# Patient Record
Sex: Female | Born: 1960 | Race: Black or African American | Hispanic: No | Smoking: Current every day smoker
Health system: Southern US, Community
[De-identification: ages and names within clinical notes are randomized; demographics above are authoritative.]

## PROBLEM LIST (undated history)

## (undated) DIAGNOSIS — E7800 Pure hypercholesterolemia, unspecified: Secondary | ICD-10-CM

## (undated) DIAGNOSIS — E119 Type 2 diabetes mellitus without complications: Secondary | ICD-10-CM

## (undated) DIAGNOSIS — E039 Hypothyroidism, unspecified: Secondary | ICD-10-CM

## (undated) DIAGNOSIS — I10 Essential (primary) hypertension: Secondary | ICD-10-CM

## (undated) DIAGNOSIS — Z87442 Personal history of urinary calculi: Secondary | ICD-10-CM

## (undated) DIAGNOSIS — IMO0001 Reserved for inherently not codable concepts without codable children: Secondary | ICD-10-CM

## (undated) DIAGNOSIS — R7989 Other specified abnormal findings of blood chemistry: Secondary | ICD-10-CM

## (undated) DIAGNOSIS — F419 Anxiety disorder, unspecified: Secondary | ICD-10-CM

## (undated) DIAGNOSIS — M545 Low back pain, unspecified: Secondary | ICD-10-CM

## (undated) DIAGNOSIS — L2089 Other atopic dermatitis: Secondary | ICD-10-CM

## (undated) DIAGNOSIS — F329 Major depressive disorder, single episode, unspecified: Secondary | ICD-10-CM

## (undated) DIAGNOSIS — F209 Schizophrenia, unspecified: Secondary | ICD-10-CM

## (undated) DIAGNOSIS — F32A Depression, unspecified: Secondary | ICD-10-CM

## (undated) DIAGNOSIS — F319 Bipolar disorder, unspecified: Secondary | ICD-10-CM

## (undated) DIAGNOSIS — D360 Benign neoplasm of lymph nodes: Secondary | ICD-10-CM

## (undated) DIAGNOSIS — R599 Enlarged lymph nodes, unspecified: Secondary | ICD-10-CM

## (undated) DIAGNOSIS — E079 Disorder of thyroid, unspecified: Secondary | ICD-10-CM

## (undated) HISTORY — DX: Reserved for inherently not codable concepts without codable children: IMO0001

## (undated) HISTORY — DX: Other atopic dermatitis: L20.89

## (undated) HISTORY — DX: Low back pain, unspecified: M54.50

## (undated) HISTORY — DX: Other specified abnormal findings of blood chemistry: R79.89

## (undated) HISTORY — DX: Schizophrenia, unspecified: F20.9

## (undated) HISTORY — DX: Low back pain: M54.5

## (undated) HISTORY — PX: APPENDECTOMY: SHX54

## (undated) HISTORY — DX: Essential (primary) hypertension: I10

## (undated) HISTORY — DX: Bipolar disorder, unspecified: F31.9

## (undated) HISTORY — DX: Type 2 diabetes mellitus without complications: E11.9

## (undated) HISTORY — PX: THYROIDECTOMY: SHX17

## (undated) HISTORY — DX: Depression, unspecified: F32.A

## (undated) HISTORY — DX: Benign neoplasm of lymph nodes: D36.0

## (undated) HISTORY — PX: ABDOMINAL HYSTERECTOMY: SHX81

## (undated) HISTORY — DX: Major depressive disorder, single episode, unspecified: F32.9

## (undated) HISTORY — DX: Enlarged lymph nodes, unspecified: R59.9

## (undated) HISTORY — DX: Hypothyroidism, unspecified: E03.9

## (undated) HISTORY — PX: EYE SURGERY: SHX253

---

## 1999-07-28 ENCOUNTER — Emergency Department (HOSPITAL_COMMUNITY): Admission: EM | Admit: 1999-07-28 | Discharge: 1999-07-28 | Payer: Self-pay | Admitting: Emergency Medicine

## 2001-01-27 ENCOUNTER — Emergency Department (HOSPITAL_COMMUNITY): Admission: EM | Admit: 2001-01-27 | Discharge: 2001-01-27 | Payer: Self-pay | Admitting: Emergency Medicine

## 2001-02-12 ENCOUNTER — Emergency Department (HOSPITAL_COMMUNITY): Admission: EM | Admit: 2001-02-12 | Discharge: 2001-02-12 | Payer: Self-pay | Admitting: Emergency Medicine

## 2001-07-16 ENCOUNTER — Emergency Department (HOSPITAL_COMMUNITY): Admission: EM | Admit: 2001-07-16 | Discharge: 2001-07-16 | Payer: Self-pay | Admitting: Emergency Medicine

## 2004-05-10 ENCOUNTER — Emergency Department (HOSPITAL_COMMUNITY): Admission: EM | Admit: 2004-05-10 | Discharge: 2004-05-11 | Payer: Self-pay | Admitting: Emergency Medicine

## 2004-12-25 ENCOUNTER — Emergency Department (HOSPITAL_COMMUNITY): Admission: EM | Admit: 2004-12-25 | Discharge: 2004-12-25 | Payer: Self-pay | Admitting: Emergency Medicine

## 2005-08-09 ENCOUNTER — Emergency Department (HOSPITAL_COMMUNITY): Admission: EM | Admit: 2005-08-09 | Discharge: 2005-08-09 | Payer: Self-pay | Admitting: Emergency Medicine

## 2006-02-05 ENCOUNTER — Emergency Department (HOSPITAL_COMMUNITY): Admission: EM | Admit: 2006-02-05 | Discharge: 2006-02-05 | Payer: Self-pay | Admitting: Emergency Medicine

## 2009-01-22 LAB — HM MAMMOGRAPHY

## 2010-09-03 ENCOUNTER — Emergency Department (HOSPITAL_COMMUNITY)
Admission: EM | Admit: 2010-09-03 | Discharge: 2010-09-03 | Disposition: A | Discharge status: Home / Self Care | Payer: BC Managed Care – PPO | Attending: Emergency Medicine | Admitting: Emergency Medicine

## 2010-09-03 ENCOUNTER — Emergency Department (HOSPITAL_COMMUNITY): Payer: BC Managed Care – PPO

## 2010-09-03 DIAGNOSIS — K8020 Calculus of gallbladder without cholecystitis without obstruction: Secondary | ICD-10-CM | POA: Insufficient documentation

## 2010-09-03 DIAGNOSIS — R1013 Epigastric pain: Secondary | ICD-10-CM | POA: Insufficient documentation

## 2010-09-03 LAB — URINALYSIS, ROUTINE W REFLEX MICROSCOPIC
Ketones, ur: NEGATIVE mg/dL
Leukocytes, UA: NEGATIVE
Nitrite: NEGATIVE
pH: 6.5 (ref 5.0–8.0)

## 2010-09-03 LAB — DIFFERENTIAL
Basophils Absolute: 0 10*3/uL (ref 0.0–0.1)
Eosinophils Absolute: 0.1 10*3/uL (ref 0.0–0.7)
Eosinophils Relative: 1 % (ref 0–5)
Lymphocytes Relative: 14 % (ref 12–46)
Neutrophils Relative %: 78 % — ABNORMAL HIGH (ref 43–77)

## 2010-09-03 LAB — COMPREHENSIVE METABOLIC PANEL
ALT: 11 U/L (ref 0–35)
AST: 14 U/L (ref 0–37)
Albumin: 3.5 g/dL (ref 3.5–5.2)
Alkaline Phosphatase: 106 U/L (ref 39–117)
Chloride: 103 mEq/L (ref 96–112)
Potassium: 3.8 mEq/L (ref 3.5–5.1)
Total Bilirubin: 0.4 mg/dL (ref 0.3–1.2)

## 2010-09-03 LAB — CBC
HCT: 36.7 % (ref 36.0–46.0)
Platelets: 358 10*3/uL (ref 150–400)
RBC: 4.46 MIL/uL (ref 3.87–5.11)
RDW: 15.8 % — ABNORMAL HIGH (ref 11.5–15.5)
WBC: 11.7 10*3/uL — ABNORMAL HIGH (ref 4.0–10.5)

## 2010-10-29 ENCOUNTER — Emergency Department (HOSPITAL_COMMUNITY)
Admission: EM | Admit: 2010-10-29 | Discharge: 2010-10-29 | Disposition: A | Discharge status: Home / Self Care | Payer: BC Managed Care – PPO | Attending: Emergency Medicine | Admitting: Emergency Medicine

## 2010-10-29 DIAGNOSIS — H109 Unspecified conjunctivitis: Secondary | ICD-10-CM | POA: Insufficient documentation

## 2010-10-29 DIAGNOSIS — E039 Hypothyroidism, unspecified: Secondary | ICD-10-CM | POA: Insufficient documentation

## 2010-10-29 DIAGNOSIS — H53149 Visual discomfort, unspecified: Secondary | ICD-10-CM | POA: Insufficient documentation

## 2010-10-29 DIAGNOSIS — H5710 Ocular pain, unspecified eye: Secondary | ICD-10-CM | POA: Insufficient documentation

## 2011-01-22 LAB — HM PAP SMEAR

## 2011-02-21 ENCOUNTER — Other Ambulatory Visit: Payer: Self-pay

## 2011-02-21 ENCOUNTER — Emergency Department (HOSPITAL_COMMUNITY)
Admission: EM | Admit: 2011-02-21 | Discharge: 2011-02-21 | Disposition: A | Discharge status: Home / Self Care | Payer: BC Managed Care – PPO | Attending: Emergency Medicine | Admitting: Emergency Medicine

## 2011-02-21 ENCOUNTER — Emergency Department (HOSPITAL_COMMUNITY): Payer: BC Managed Care – PPO

## 2011-02-21 ENCOUNTER — Encounter (HOSPITAL_COMMUNITY): Payer: Self-pay | Admitting: *Deleted

## 2011-02-21 DIAGNOSIS — R079 Chest pain, unspecified: Secondary | ICD-10-CM | POA: Insufficient documentation

## 2011-02-21 DIAGNOSIS — R0602 Shortness of breath: Secondary | ICD-10-CM | POA: Insufficient documentation

## 2011-02-21 HISTORY — DX: Pure hypercholesterolemia, unspecified: E78.00

## 2011-02-21 HISTORY — DX: Anxiety disorder, unspecified: F41.9

## 2011-02-21 HISTORY — DX: Disorder of thyroid, unspecified: E07.9

## 2011-02-21 LAB — BASIC METABOLIC PANEL
BUN: 8 mg/dL (ref 6–23)
CO2: 25 mEq/L (ref 19–32)
Calcium: 9.4 mg/dL (ref 8.4–10.5)
Chloride: 105 mEq/L (ref 96–112)
Creatinine, Ser: 0.7 mg/dL (ref 0.50–1.10)
GFR calc Af Amer: >90 mL/min (ref >90)
GFR calc non Af Amer: >90 mL/min (ref >90)
Glucose, Bld: 119 mg/dL — ABNORMAL HIGH (ref 70–99)
Potassium: 3.6 mEq/L (ref 3.5–5.1)
Sodium: 137 mEq/L (ref 135–145)

## 2011-02-21 LAB — CBC
HCT: 34.4 % — ABNORMAL LOW (ref 36.0–46.0)
Hemoglobin: 11.5 g/dL — ABNORMAL LOW (ref 12.0–15.0)
MCH: 26.8 pg (ref 26.0–34.0)
MCHC: 33.4 g/dL (ref 30.0–36.0)
MCV: 80.2 fL (ref 78.0–100.0)
Platelets: 337 10*3/uL (ref 150–400)
RBC: 4.29 MIL/uL (ref 3.87–5.11)
RDW: 16.7 % — ABNORMAL HIGH (ref 11.5–15.5)
WBC: 4.7 10*3/uL (ref 4.0–10.5)

## 2011-02-21 LAB — TROPONIN I: Troponin I: <0.3 ng/mL (ref <0.30)

## 2011-02-21 MED ORDER — OXYCODONE-ACETAMINOPHEN 5-325 MG PO TABS: 1.0000 | ORAL_TABLET | ORAL | Status: AC | PRN

## 2011-02-21 MED ORDER — NAPROXEN 500 MG PO TABS: 500.0000 mg | ORAL_TABLET | Freq: Two times a day (BID) | ORAL | Status: DC

## 2011-02-21 MED ORDER — ASPIRIN 81 MG PO CHEW
324.0000 mg | CHEWABLE_TABLET | Freq: Once | ORAL | Status: AC
  Administered 2011-02-21: 324 mg via ORAL
  Filled 2011-02-21: qty 4

## 2011-02-21 NOTE — ED Provider Notes (Signed)
History    51yf with CP. Gradual onset day before yesterday. Crampy. Substernal. Occasional sharper pain with movement and radiation into LUE. Denies trauma. mild shortness of breath. No coughing. No rash. No unusual leg pain or swelling. Denies history of blood clot. No fever sweats or chills. Denies history of similar pain.  CSN: [id number]  Arrival date & time 02/21/11  1227   First MD Initiated Contact with Patient 02/21/11 1245      Chief Complaint  Patient presents with  . Chest Pain    (Consider location/radiation/quality/duration/timing/severity/associated sxs/prior treatment) HPI  Past Medical History  Diagnosis Date  . Thyroid disease   . Hypercholesteremia   . Anxiety     Past Surgical History  Procedure Date  . Abdominal hysterectomy   . Thyroidectomy   . Appendectomy     No family history on file.  History  Substance Use Topics  . Smoking status: Current Everyday Smoker  . Smokeless tobacco: Not on file  . Alcohol Use: No    OB History    Grav Para Term Preterm Abortions TAB SAB Ect Mult Living                  Review of Systems   Review of symptoms negative unless otherwise noted in HPI.   Allergies  Review of patient's allergies indicates no known allergies.  Home Medications  No current outpatient prescriptions on file.  BP 134/93  Pulse 93  Temp(Src) 98.3 F (36.8 C) (Oral)  Resp 24  Physical Exam  Nursing note and vitals reviewed. Constitutional: She appears well-developed and well-nourished. No distress.  HENT:  Head: Normocephalic and atraumatic.  Eyes: Conjunctivae are normal. Right eye exhibits no discharge. Left eye exhibits no discharge.  Neck: Neck supple.  Cardiovascular: Normal rate, regular rhythm and normal heart sounds.  Exam reveals no gallop and no friction rub.   No murmur heard. Pulmonary/Chest: Effort normal and breath sounds normal. No respiratory distress. She exhibits tenderness.       Tenderness to  palpation left parasternal border. Overlying skin is grossly normal to inspection. Pain is reproducible with range of motion left shoulder  Abdominal: Soft. She exhibits no distension. There is no tenderness.  Musculoskeletal: She exhibits no edema and no tenderness.  Neurological: She is alert.  Skin: Skin is warm and dry.  Psychiatric: She has a normal mood and affect. Her behavior is normal. Thought content normal.    ED Course  Procedures (including critical care time)  Labs Reviewed  CBC - Abnormal; Notable for the following:    Hemoglobin 11.5 (*)    HCT 34.4 (*)    RDW 16.7 (*)    All other components within normal limits  BASIC METABOLIC PANEL - Abnormal; Notable for the following:    Glucose, Bld 119 (*)    All other components within normal limits  TROPONIN I   Dg Chest 1 View  02/21/2011  *RADIOLOGY REPORT*  Clinical Data: Chest pain.  CHEST - 1 VIEW  Comparison: None.  Findings: Portable semi upright view of the chest was obtained. The lungs are clear bilaterally.  There is a round density along the medial aspect of the left hemidiaphragm.  This could represent focal eventration of the left hemidiaphragm. Heart size is within normal limits and the trachea is midline.  IMPRESSION: No acute chest findings.  Round density along the medial aspect of the left hemidiaphragm may be related to diaphragmatic eventration or hernia but indeterminate.  A lateral  view may be useful to further identify the location of this structure.  Original Report Authenticated By: Richarda Overlie, M.D.    EKG:  Rhythm: Normal sinus rhythm Rate: 90 Axis: Normal Intervals: Normal ST segments: Normal  2:40 PM Patient's O2 sat checked and was 97% on room air.  1. Chest pain       MDM  51 year old female with chest pain. Consider ACS, pulmonary embolism, dissection, infectious, GERD, anxiety, musculoskeletal. Suspect musculoskeletal. Pain is very reproducible with palpation and movement of the left  shoulder. Patient does have multiple risk factors for coronary artery disease including age, truncal obesity, hypertension, hyperlipidemia, smoking and family history but this is very atypical pain for ACS given reproducibility and constant duration for several days. Very low clinical suspicion. EKG with no ischemic changes. Troponin is normal. Doubt infectious. Patient is not hypoxic. No dyspnea. No cough and afebrile. Chest x-ray does not suggest pneumonia. Mediastinum is within normal limits and no radiation to the back to suggest dissection. Doubt GERD. Low clinical suspicion for pulmonary embolism. Plan symptomatic treatment for very likely musculoskeletal pain.        Raeford Razor, MD 03/05/11 [phone]

## 2011-02-21 NOTE — ED Notes (Signed)
[id number]<GN> Expected date:<BR> Expected time:<BR> Means of arrival:<BR> Comments:<BR> Toni Moses

## 2011-02-21 NOTE — ED Notes (Signed)
Dr. Juleen China notified re pt's dizziness spell

## 2011-02-21 NOTE — ED Notes (Signed)
Pt reports chest pain that began yesterday. Describes as a "cramping sensation" with occasional sharp pains. Reports sob and nausea associated. Stated pain radiated down L arm as well. No cardiac Hx. Sts pain worsens with movement, no change with deep breath.

## 2011-06-09 ENCOUNTER — Emergency Department (HOSPITAL_COMMUNITY)
Admission: EM | Admit: 2011-06-09 | Discharge: 2011-06-09 | Discharge status: Against Medical Advice | Payer: BC Managed Care – PPO | Attending: Emergency Medicine | Admitting: Emergency Medicine

## 2011-06-09 DIAGNOSIS — Z043 Encounter for examination and observation following other accident: Secondary | ICD-10-CM | POA: Insufficient documentation

## 2011-07-07 ENCOUNTER — Emergency Department (HOSPITAL_COMMUNITY): Payer: BC Managed Care – PPO

## 2011-07-07 ENCOUNTER — Encounter (HOSPITAL_COMMUNITY): Payer: Self-pay | Admitting: Emergency Medicine

## 2011-07-07 ENCOUNTER — Emergency Department (HOSPITAL_COMMUNITY)
Admission: EM | Admit: 2011-07-07 | Discharge: 2011-07-07 | Disposition: A | Discharge status: Home / Self Care | Payer: BC Managed Care – PPO | Attending: Emergency Medicine | Admitting: Emergency Medicine

## 2011-07-07 DIAGNOSIS — E7800 Pure hypercholesterolemia, unspecified: Secondary | ICD-10-CM | POA: Insufficient documentation

## 2011-07-07 DIAGNOSIS — E079 Disorder of thyroid, unspecified: Secondary | ICD-10-CM | POA: Insufficient documentation

## 2011-07-07 DIAGNOSIS — F17200 Nicotine dependence, unspecified, uncomplicated: Secondary | ICD-10-CM | POA: Insufficient documentation

## 2011-07-07 DIAGNOSIS — R079 Chest pain, unspecified: Secondary | ICD-10-CM | POA: Insufficient documentation

## 2011-07-07 DIAGNOSIS — E785 Hyperlipidemia, unspecified: Secondary | ICD-10-CM | POA: Insufficient documentation

## 2011-07-07 DIAGNOSIS — F411 Generalized anxiety disorder: Secondary | ICD-10-CM | POA: Insufficient documentation

## 2011-07-07 LAB — BASIC METABOLIC PANEL
BUN: 13 mg/dL (ref 6–23)
Calcium: 9.3 mg/dL (ref 8.4–10.5)
Creatinine, Ser: 0.7 mg/dL (ref 0.50–1.10)
GFR calc non Af Amer: >90 mL/min (ref >90)
Glucose, Bld: 98 mg/dL (ref 70–99)
Potassium: 4.6 mEq/L (ref 3.5–5.1)

## 2011-07-07 LAB — CBC
Hemoglobin: 11.3 g/dL — ABNORMAL LOW (ref 12.0–15.0)
MCH: 25.6 pg — ABNORMAL LOW (ref 26.0–34.0)
MCHC: 32.8 g/dL (ref 30.0–36.0)

## 2011-07-07 LAB — POCT I-STAT TROPONIN I: Troponin i, poc: 0 ng/mL (ref 0.00–0.08)

## 2011-07-07 MED ORDER — ALPRAZOLAM 0.25 MG PO TABS
1.0000 mg | ORAL_TABLET | Freq: Once | ORAL | Status: AC
  Administered 2011-07-07: 1 mg via ORAL
  Filled 2011-07-07: qty 4

## 2011-07-07 MED ORDER — ASPIRIN 81 MG PO CHEW
324.0000 mg | CHEWABLE_TABLET | Freq: Once | ORAL | Status: AC
Start: 2011-07-07 — End: 2011-07-07
  Administered 2011-07-07: 324 mg via ORAL
  Filled 2011-07-07: qty 4

## 2011-07-07 MED ORDER — ALPRAZOLAM 1 MG PO TABS: 1.0000 mg | ORAL_TABLET | Freq: Three times a day (TID) | ORAL | Status: DC | PRN

## 2011-07-07 NOTE — ED Notes (Signed)
Judeth Cornfield, [place] notified that pt refuses IV access.  Pt threatening to leave AMA if IV started.  Will hold on the IV start at this time.  Obtaining order for Xanax, which pt usually takes and has been out for 2 weeks.

## 2011-07-07 NOTE — ED Provider Notes (Signed)
History     CSN: [id number]  Arrival date & time 07/07/11  1000   First MD Initiated Contact with Patient 07/07/11 1031      Chief Complaint  Patient presents with  . Chest Pain    (Consider location/radiation/quality/duration/timing/severity/associated sxs/prior treatment) The history is provided by the patient.   patient is a 51 year old female a past medical history of anxiety and hyperlipidemia who presents the emergency room with a chief complaint of sudden onset substernal chest pain while at rest approximately 30 minutes prior to arrival. Pain sharp with radiation to the right side of the chest in the right arm. Severe. Nonexertional. The pain initially resolved after approximately 15 minutes. It has returned for several seconds at a time, less severe in intensity several times since then. Associated with nausea, shortness of breath, diaphoresis. Nonpleuritic. No recent fever, cough, illness. Symptoms without any identifiable aggravating or alleviating factors. There was no prior treatment. Has been under a great deal of stress due to the illness of her husband. Ran out of her Xanax several weeks ago and cannot afford to see her Dr. to get a new prescription. Symptoms feel different from her anxiety attacks. Family history of heart disease in her mother at a later age. Has never had a cardiac evaluation in the past. No personal or known family history of DVT or PE. No recent prolonged travel. Denies pain or swelling to the lower extremities.  Past Medical History  Diagnosis Date  . Thyroid disease   . Hypercholesteremia   . Anxiety     Past Surgical History  Procedure Date  . Abdominal hysterectomy   . Thyroidectomy   . Appendectomy     No family history on file.  History  Substance Use Topics  . Smoking status: Current Everyday Smoker  . Smokeless tobacco: Not on file  . Alcohol Use: No     Review of Systems 10 systems reviewed and are negative for acute change  except as noted in the HPI.  Allergies  Review of patient's allergies indicates no known allergies.  Home Medications   Current Outpatient Rx  Name Route Sig Dispense Refill  . ALPRAZOLAM 1 MG PO TABS Oral Take 1 mg by mouth 4 (four) times daily as needed. For anxiety.    . ATORVASTATIN CALCIUM 40 MG PO TABS Oral Take 40 mg by mouth daily.    Marland Kitchen LEVOTHYROXINE SODIUM 150 MCG PO TABS Oral Take 300 mcg by mouth daily.    Marland Kitchen PIMECROLIMUS 1 % EX CREA Topical Apply 1 application topically 2 (two) times daily.    . TRIAMCINOLONE ACETONIDE 0.5 % EX CREA Topical Apply 1 application topically daily.      BP 108/94  Pulse 97  Temp 98.2 F (36.8 C) (Oral)  Resp 20  SpO2 98%  Physical Exam  Nursing note reviewed. Constitutional: She is oriented to person, place, and time. She appears well-developed and well-nourished.       Vital signs are reviewed and are normal. Anxious appearing. Tearful at times. Not appear uncomfortable.  HENT:  Head: Normocephalic and atraumatic.       Mucous membranes moist.  Eyes: Conjunctivae are normal.       Left pupil is irregular and 4 mm, poorly responsive. Right pupil 3 mm, regular, reactive to light. Normal per patient, with long-standing history of multiple eye surgeries.  Neck: Neck supple.  Cardiovascular: Normal rate, regular rhythm and normal heart sounds.        Bilateral radial  and dorsalis pedis pulses are 2+.  Pulmonary/Chest: Effort normal and breath sounds normal. No respiratory distress. She has no wheezes. She has no rales. She exhibits tenderness (point tenderness over the right chest wall.).  Abdominal: Soft. Bowel sounds are normal. She exhibits no distension. There is no tenderness. There is no guarding.  Musculoskeletal: She exhibits no edema.       Calves are supple and nontender.  Neurological: She is alert and oriented to person, place, and time. No cranial nerve deficit (3 through 12 intact).  Skin: Skin is warm and dry.  Psychiatric:         Anxious    ED Course  Procedures (including critical care time)  Labs Reviewed  CBC - Abnormal; Notable for the following:    Hemoglobin 11.3 (*)     HCT 34.5 (*)     MCH 25.6 (*)     RDW 16.4 (*)     All other components within normal limits  BASIC METABOLIC PANEL  POCT I-STAT TROPONIN I  POCT I-STAT TROPONIN I   Dg Chest 2 View  07/07/2011  *RADIOLOGY REPORT*  Clinical Data: Chest pain.  Dizziness.  CHEST - 2 VIEW  Comparison: 02/21/2011.  Findings: Stable normal sized heart and linear scarring at the left lung base.  A rounded density at the medial right lung base is unchanged.  This is posteriorly located on the lateral view.  Clear right lung.  Mild scoliosis.  IMPRESSION:  1.  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 2.  No acute abnormality.  Original Report Authenticated By: Darrol Angel, M.D.     Date: 07/07/2011  Rate: 91  Rhythm: normal sinus rhythm  QRS Axis: normal  Intervals: normal  ST/T Wave abnormalities: normal  Conduction Disutrbances:none  Narrative Interpretation: prior tracing dated 02/21/2011  Old EKG Reviewed: unchanged    Dx 1: Chest pain   MDM  Chest pain, resolved. Doubt PE- only risk factor is cigarette use, no hypoxia, no tachycardia or tachypnea, calves supple and non-tender. Sx atypical for ACS- normal EKG with neg trop x 2 are reassuring, doubt ischemic event. CXR without change from eval 4 months ago. Stable anemia as compared to 4 months ago. Xanax in ED gave sig relief of anxiety, no CP recurrence since initial eval. She will be d/c home. Return precautions discussed.  Will give resources for PMD f/u as pt has financial difficulties that prevent f/u with prior MD.        Shaaron Adler, PA-C 07/07/11 1544

## 2011-07-07 NOTE — Discharge Instructions (Signed)
As we discussed, your heart tracing was normal and your labs showed no sign of heart damage.   If you have no primary doctor, here are some resources that may be helpful:  Medicaid-accepting [facility] Providers:   - [facility]- [address]      [phone]      Mon-Fri 9am-7pm, Sat 9am-1pm   - [facility]- [address] [place]      [phone]   - [facility]- [address] [place]      [phone]   [facility]- [address]      [phone]   - Renaye Rakers- [address] [place], [address]      [phone]      Only accepts [place] patients       after they have her name applied to their card   Self Pay (no insurance) in [place]:   - Sickle Cell Patients: Dr Willey Blade, [facility]      [address] [place]      [phone]   - [phone]   - Physician Referral Service- [phone]   - [facility]- [address] [place]      [phone]   [facility]- [address]   - [facility]- see information above      (Speak to [employer] if you do not have insurance)   - [facility]- [address] [place]      [phone]   - [facility] [place]- [address]      [phone]   - [facility]- [address]      [phone]   - Dr Julio Sicks-  [address], [place]      [phone]   - [facility]- [address]      [phone]   - [facility]- [address]      [phone]      Also [address]      [phone]   - [facility]- [address]      [phone]      1st and 3rd Saturday every month, 10am-1pm Other agencies that provide inexpensive medical care:    [facility]  [phone]    [facility]  [phone]    [facility]  [phone]    [facility]   [phone]    [facility]  [phone]  General Information: Finding a doctor when you do not have health insurance can be tricky. Although you are not limited by an insurance plan, you are of course limited by her finances and how much but he can pay out of pocket.  What are your options if you don't have health insurance?   1) Find a Librarian, academic and Pay Out of Pocket Although you won't have to find out who is covered by your insurance plan, it is a good idea to ask around and get recommendations. You will then need to call the office and see if the doctor you have chosen will accept you as a new patient and what types of options they offer for patients who are self-pay. Some doctors offer discounts or will set up payment plans for their patients who do  not have insurance, but you will need to ask so you aren't surprised when you get to your appointment.  2) Contact Your Local Health Department Not all health departments have doctors that can see patients for sick visits, but many do, so it is worth a call to see if yours does. If you don't know where your local health department is, you can check in your phone book. The CDC also has a tool to help you locate your state's health department, and many state websites also have listings of all of their local health departments.  3) Find a Walk-in Clinic If your illness is not likely to be very severe or complicated, you may want to try a walk in clinic. These are popping up all over the country in pharmacies, drugstores, and shopping centers. They're usually staffed by nurse practitioners or physician assistants that have been trained to treat common illnesses and complaints. They're usually fairly quick and inexpensive. However, if you have serious medical issues or chronic medical problems, these are probably not your best option        Chest Pain (Nonspecific) It is often hard to give a specific diagnosis for the cause of chest pain. There is  always a chance that your pain could be related to something serious, such as a heart attack or a blood clot in the lungs. You need to follow up with your caregiver for further evaluation. CAUSES   Heartburn.   Pneumonia or bronchitis.   Anxiety or stress.   Inflammation around your heart (pericarditis) or lung (pleuritis or pleurisy).   A blood clot in the lung.   A collapsed lung (pneumothorax). It can develop suddenly on its own (spontaneous pneumothorax) or from injury (trauma) to the chest.   Shingles infection (herpes zoster virus).  The chest wall is composed of bones, muscles, and cartilage. Any of these can be the source of the pain.  The bones can be bruised by injury.   The muscles or cartilage can be strained by coughing or overwork.   The cartilage can be affected by inflammation and become sore (costochondritis).  DIAGNOSIS  Lab tests or other studies, such as X-rays, electrocardiography, stress testing, or cardiac imaging, may be needed to find the cause of your pain.  TREATMENT   Treatment depends on what may be causing your chest pain. Treatment may include:   Acid blockers for heartburn.   Anti-inflammatory medicine.   Pain medicine for inflammatory conditions.   Antibiotics if an infection is present.   You may be advised to change lifestyle habits. This includes stopping smoking and avoiding alcohol, caffeine, and chocolate.   You may be advised to keep your head raised (elevated) when sleeping. This reduces the chance of acid going backward from your stomach into your esophagus.   Most of the time, nonspecific chest pain will improve within 2 to 3 days with rest and mild pain medicine.  HOME CARE INSTRUCTIONS   If antibiotics were prescribed, take your antibiotics as directed. Finish them even if you start to feel better.   For the next few days, avoid physical activities that bring on chest pain. Continue physical activities as directed.   Do not  smoke.   Avoid drinking alcohol.   Only take over-the-counter or prescription medicine for pain, discomfort, or fever as directed by your caregiver.   Follow your caregiver's suggestions for further testing if your chest pain does not go away.   Keep any follow-up appointments you made.  If you do not go to an appointment, you could develop lasting (chronic) problems with pain. If there is any problem keeping an appointment, you must call to reschedule.  SEEK MEDICAL CARE IF:   You think you are having problems from the medicine you are taking. Read your medicine instructions carefully.   Your chest pain does not go away, even after treatment.   You develop a rash with blisters on your chest.  SEEK IMMEDIATE MEDICAL CARE IF:   You have increased chest pain or pain that spreads to your arm, neck, jaw, back, or abdomen.   You develop shortness of breath, an increasing cough, or you are coughing up blood.   You have severe back or abdominal pain, feel nauseous, or vomit.   You develop severe weakness, fainting, or chills.   You have a fever.  THIS IS AN EMERGENCY. Do not wait to see if the pain will go away. Get medical help at once. Call your local emergency services (911 in [place] Do not drive yourself to the hospital. MAKE SURE YOU:   Understand these instructions.   Will watch your condition.   Will get help right away if you are not doing well or get worse.  Document Released: 10/16/2004 Document Revised: 12/26/2010 Document Reviewed: 08/12/2007 [facility] Patient Information 2012 [place], [place].

## 2011-07-07 NOTE — ED Notes (Signed)
Pt. Stated, I was upstairs with my husband in Rehab and all of a sudden I started having sudden CP and I got weak and started sweating and it was just a sharp CP .

## 2011-07-08 NOTE — ED Provider Notes (Signed)
Medical screening examination/treatment/procedure(s) were conducted as a shared visit with non-physician practitioner(s) and myself.  I personally evaluated the patient during the encounter  Reproducible R sided chest pain, nonexertional. Hx anxiety. No previous cardiac evaluation. EKG nonischemic. Atypical for ACS.  Glynn Octave, MD 07/08/11 1450

## 2011-09-01 ENCOUNTER — Emergency Department (HOSPITAL_COMMUNITY)
Admission: EM | Admit: 2011-09-01 | Discharge: 2011-09-01 | Disposition: A | Discharge status: Home / Self Care | Payer: BC Managed Care – PPO | Attending: Emergency Medicine | Admitting: Emergency Medicine

## 2011-09-01 ENCOUNTER — Encounter (HOSPITAL_COMMUNITY): Payer: Self-pay | Admitting: *Deleted

## 2011-09-01 ENCOUNTER — Emergency Department (HOSPITAL_COMMUNITY): Payer: BC Managed Care – PPO

## 2011-09-01 DIAGNOSIS — M542 Cervicalgia: Secondary | ICD-10-CM

## 2011-09-01 DIAGNOSIS — M62838 Other muscle spasm: Secondary | ICD-10-CM | POA: Insufficient documentation

## 2011-09-01 DIAGNOSIS — E079 Disorder of thyroid, unspecified: Secondary | ICD-10-CM | POA: Insufficient documentation

## 2011-09-01 DIAGNOSIS — E7800 Pure hypercholesterolemia, unspecified: Secondary | ICD-10-CM | POA: Insufficient documentation

## 2011-09-01 DIAGNOSIS — F17200 Nicotine dependence, unspecified, uncomplicated: Secondary | ICD-10-CM | POA: Insufficient documentation

## 2011-09-01 MED ORDER — METHOCARBAMOL 500 MG PO TABS: 500.0000 mg | ORAL_TABLET | Freq: Four times a day (QID) | ORAL | Status: AC

## 2011-09-01 MED ORDER — OXYCODONE-ACETAMINOPHEN 5-325 MG PO TABS: ORAL_TABLET | ORAL | Status: AC

## 2011-09-01 MED ORDER — KETOROLAC TROMETHAMINE 60 MG/2ML IM SOLN
60.0000 mg | Freq: Once | INTRAMUSCULAR | Status: AC
  Administered 2011-09-01: 60 mg via INTRAMUSCULAR
  Filled 2011-09-01: qty 2

## 2011-09-01 NOTE — ED Notes (Signed)
Pt reports muscle spasms to neck and right hand x3 days. Pt reports pain has become worse yesterday.  Pt has tried tylenol and aleve that improves pain for an hour. Pt denies injuries to neck.  Pt has been taking care of her husband- pushing him in a wheelchair that may have aggravated her neck.

## 2011-09-01 NOTE — ED Provider Notes (Signed)
Medical screening examination/treatment/procedure(s) were performed by non-physician practitioner and as supervising physician I was immediately available for consultation/collaboration.   Ronan Dion, MD 09/01/11 1601 

## 2011-09-01 NOTE — ED Provider Notes (Signed)
History     CSN: [id number]  Arrival date & time 09/01/11  4098   First MD Initiated Contact with Patient 09/01/11 0912      Chief Complaint  Patient presents with  . Neck Pain  . Arm Pain    (Consider location/radiation/quality/duration/timing/severity/associated sxs/prior treatment) HPI  51 y/o female appears agitated c/o right sided neck and shoulder pain 10/10 unrelieved by vicodin associated with right finger paraesthesia of pins and needles x3 days. Pt has had prior episodes for 5 years but they have never lated this long and have never been this severe. Denies trauma or retentive use: recent or remotely. Denies  weakness  Past Medical History  Diagnosis Date  . Thyroid disease   . Hypercholesteremia   . Anxiety     Past Surgical History  Procedure Date  . Abdominal hysterectomy   . Thyroidectomy   . Appendectomy     No family history on file.  History  Substance Use Topics  . Smoking status: Current Everyday Smoker  . Smokeless tobacco: Not on file  . Alcohol Use: No    OB History    Grav Para Term Preterm Abortions TAB SAB Ect Mult Living                  Review of Systems  Musculoskeletal: Positive for back pain.  Neurological: Negative for weakness.  All other systems reviewed and are negative.    Allergies  Review of patient's allergies indicates no known allergies.  Home Medications   Current Outpatient Rx  Name Route Sig Dispense Refill  . LEVOTHYROXINE SODIUM 150 MCG PO TABS Oral Take 300 mcg by mouth daily.    Marland Kitchen PIMECROLIMUS 1 % EX CREA Topical Apply 1 application topically 2 (two) times daily.    . TRIAMCINOLONE ACETONIDE 0.5 % EX CREA Topical Apply 1 application topically daily.      BP 155/91  Pulse 78  Temp 98.1 F (36.7 C) (Oral)  Resp 18  SpO2 100%  Physical Exam  Nursing note and vitals reviewed. Constitutional: She is oriented to person, place, and time. She appears well-developed and well-nourished. No distress.    HENT:  Head: Normocephalic.  Eyes: Conjunctivae and EOM are normal. Pupils are equal, round, and reactive to light.  Neck: Normal range of motion. Neck supple. No JVD present. No tracheal deviation present. No thyromegaly present.       No midline tenderness, Mild muscle spasm of to right. Diffusely tender  Cardiovascular: Normal rate.   Pulmonary/Chest: Effort normal.  Musculoskeletal: Normal range of motion. She exhibits no edema and no tenderness.       FROM of right shoulder with no rotator cuff tenderness. Radial pulses 2+ bilaterally. Sensation intact to pinprick and light tough. Strength 5 out of 5 bilateral upper extremities.  Lymphadenopathy:    She has no cervical adenopathy.  Neurological: She is alert and oriented to person, place, and time.  Psychiatric: She has a normal mood and affect.    ED Course  Procedures (including critical care time)  Labs Reviewed - No data to display Dg Cervical Spine Complete  09/01/2011  *RADIOLOGY REPORT*  Clinical Data: Neck pain  CERVICAL SPINE - COMPLETE 4+ VIEW  Comparison: None.  Findings: Six views of cervical spine submitted.  No acute fracture or subluxation.  There is mild disc space flattening at C5-C6 level.  No prevertebral soft tissue swelling.  Cervical airway is patent.  IMPRESSION: No acute fracture or subluxation.  Mild disc  space flattening at C5- C6 level.  Original Report Authenticated By: Natasha Mead, M.D.     1. Cervicalgia   2. Muscle spasm       MDM  Patient presenting with extreme muscle pain to right neck and shoulder pain with paresthesia C-spine radiographs show the space flattening at C5-C6 likely responsible for the paresthesia I will give her a resource guide for primary care referral and also her for followup.       Wynetta Emery, PA-C 09/01/11 1023

## 2011-11-18 ENCOUNTER — Encounter (HOSPITAL_COMMUNITY): Payer: Self-pay | Admitting: *Deleted

## 2011-11-18 ENCOUNTER — Emergency Department (HOSPITAL_COMMUNITY)
Admission: EM | Admit: 2011-11-18 | Discharge: 2011-11-18 | Disposition: A | Discharge status: Home / Self Care | Payer: BC Managed Care – PPO | Attending: Emergency Medicine | Admitting: Emergency Medicine

## 2011-11-18 ENCOUNTER — Emergency Department (HOSPITAL_COMMUNITY): Payer: BC Managed Care – PPO

## 2011-11-18 DIAGNOSIS — R599 Enlarged lymph nodes, unspecified: Secondary | ICD-10-CM | POA: Insufficient documentation

## 2011-11-18 DIAGNOSIS — R590 Localized enlarged lymph nodes: Secondary | ICD-10-CM

## 2011-11-18 DIAGNOSIS — F411 Generalized anxiety disorder: Secondary | ICD-10-CM | POA: Insufficient documentation

## 2011-11-18 DIAGNOSIS — F17200 Nicotine dependence, unspecified, uncomplicated: Secondary | ICD-10-CM | POA: Insufficient documentation

## 2011-11-18 DIAGNOSIS — Z79899 Other long term (current) drug therapy: Secondary | ICD-10-CM | POA: Insufficient documentation

## 2011-11-18 DIAGNOSIS — E7800 Pure hypercholesterolemia, unspecified: Secondary | ICD-10-CM | POA: Insufficient documentation

## 2011-11-18 DIAGNOSIS — E079 Disorder of thyroid, unspecified: Secondary | ICD-10-CM | POA: Insufficient documentation

## 2011-11-18 NOTE — ED Notes (Signed)
Pt requesting for something to eat and drink.

## 2011-11-18 NOTE — ED Provider Notes (Signed)
History   This chart was scribed for non-physician practitioner working with Suzi Roots, MD by Greer Ee. This patient was seen in room WTR8/WTR8 and the patient's care was started at 12:09.   CSN: [id number]  Arrival date & time 11/18/11  1058   First MD Initiated Contact with Patient 11/18/11 1140      Chief Complaint  Patient presents with  . Mass    R side neck    (Consider location/radiation/quality/duration/timing/severity/associated sxs/prior treatment) The history is provided by the patient. No language interpreter was used.   Toni Moses is a 51 y.o. female who presents to the Emergency Department complaining of mass in right side neck noticed 2 days ago that is non-tender to touch.  Pt is unsure if mass has grown in past 2 days.  Pt has a h/o thyroid disease but denies similar h/o masses or swollen lymph nodes in area.  Pt denies sore throat, rhinorrhea, cough, nausea, emesis, abdominal pain, fever, night sweats, or unexpected weight loss as associated symptoms.  Pt is a current everyday smoker but denies alcohol use. PCP is Dr. Sander Radon.  Past Medical History  Diagnosis Date  . Thyroid disease   . Hypercholesteremia   . Anxiety     Past Surgical History  Procedure Date  . Abdominal hysterectomy   . Thyroidectomy   . Appendectomy     No family history on file.  History  Substance Use Topics  . Smoking status: Current Every Day Smoker    Types: Cigarettes  . Smokeless tobacco: Not on file  . Alcohol Use: No    No OB history provided.  Review of Systems  All other systems reviewed and are negative.    Allergies  Review of patient's allergies indicates no known allergies.  Home Medications   Current Outpatient Rx  Name Route Sig Dispense Refill  . ALPRAZOLAM 1 MG PO TABS Oral Take 1 mg by mouth 3 (three) times daily as needed. For anxiety.    Marland Kitchen LEVOTHYROXINE SODIUM 150 MCG PO TABS Oral Take 300 mcg by mouth daily.    Marland Kitchen PIMECROLIMUS 1 % EX  CREA Topical Apply 1 application topically 2 (two) times daily.    . TRIAMCINOLONE ACETONIDE 0.5 % EX CREA Topical Apply 1 application topically daily.      BP 132/83  Pulse 84  Temp 97.7 F (36.5 C) (Oral)  Resp 20  SpO2 100%  Physical Exam  Nursing note and vitals reviewed. Constitutional: She is oriented to person, place, and time. She appears well-developed and well-nourished. No distress.  HENT:  Head: Normocephalic and atraumatic.  Right Ear: Tympanic membrane normal.  Left Ear: Tympanic membrane normal.  Mouth/Throat: Oropharynx is clear and moist and mucous membranes are normal.  Eyes: EOM are normal.  Neck: Normal range of motion. Neck supple. No tracheal deviation present.       3cm soft round mass at base of right side neck, in the supraclavicular fossa. Non tender. Skin normal with no redness, induration.   Cardiovascular: Normal rate, regular rhythm and normal heart sounds.   No murmur heard. Pulmonary/Chest: Effort normal and breath sounds normal. No respiratory distress.  Abdominal: Soft. Bowel sounds are normal. There is no tenderness.  Musculoskeletal: Normal range of motion.  Neurological: She is alert and oriented to person, place, and time.  Skin: Skin is warm and dry.  Psychiatric: She has a normal mood and affect. Her behavior is normal.    ED Course  Procedures (including  critical care time) DIAGNOSTIC STUDIES: Oxygen Saturation is 100% on room air, normal by my interpretation.    COORDINATION OF CARE: 12:14- Ordered chest XR.  Advised follow up with PCP for further testing.    Labs Reviewed - No data to display Dg Chest 2 View  11/18/2011  *RADIOLOGY REPORT*  Clinical Data: Mass in the right upper chest near clavicle  CHEST - 2 VIEW  Comparison: Chest x-ray of 07/07/2011  Findings: The lungs are clear.  Mediastinal contours are stable. No soft tissue masses evident by chest x-ray.  The heart is seen within upper limits normal.  Probable focal  eventration of the posterior medial left hemidiaphragm is stable.  No bony abnormality is seen.  IMPRESSION: Stable chest x-ray.  No active lung disease.   Original Report Authenticated By: Juline Patch, M.D.      1. Supraclavicular lymphadenopathy       MDM  Pt with right supraclavicular mass vs lymph node. It is non tender. Doubt infectious. Pt asymptomatic otherwise. Concerning for possible malignancy. CXR obtained and negative. Pt has appointment tomorrow morning with her PCP. Reassured for now, and explained further testing should be performed and should be ordered by her doctor. Pt voiced understanding. Will d/c home with follow up tomorrow. Return if any problems.    The chart was scribed for me in my presence. The care of the patient including taking history, physical examination, MDM was performed directly by me.          Lottie Mussel, PA 11/18/11 1524

## 2011-11-18 NOTE — ED Notes (Signed)
Pt reports noticing a "lump" on R side of her neck x 2 days ago, states pain started today.  Reports pain radiates to her R arm.  Pt is anxious, states she is scared.  Denies being sick with a cold lately.

## 2011-11-18 NOTE — ED Notes (Signed)
Please see triage note

## 2011-11-19 ENCOUNTER — Other Ambulatory Visit: Payer: Self-pay | Admitting: Internal Medicine

## 2011-11-19 DIAGNOSIS — R591 Generalized enlarged lymph nodes: Secondary | ICD-10-CM

## 2011-11-20 ENCOUNTER — Ambulatory Visit
Admission: RE | Admit: 2011-11-20 | Discharge: 2011-11-20 | Disposition: A | Discharge status: Home / Self Care | Payer: BC Managed Care – PPO | Source: Ambulatory Visit | Attending: Internal Medicine | Admitting: Internal Medicine

## 2011-11-20 DIAGNOSIS — R591 Generalized enlarged lymph nodes: Secondary | ICD-10-CM

## 2011-11-22 NOTE — ED Provider Notes (Signed)
Medical screening examination/treatment/procedure(s) were performed by non-physician practitioner and as supervising physician I was immediately available for consultation/collaboration.   Suzi Roots, MD 11/22/11 [phone]

## 2011-11-26 ENCOUNTER — Other Ambulatory Visit (HOSPITAL_COMMUNITY): Payer: Self-pay | Admitting: Internal Medicine

## 2011-11-26 DIAGNOSIS — R591 Generalized enlarged lymph nodes: Secondary | ICD-10-CM

## 2011-11-26 DIAGNOSIS — R599 Enlarged lymph nodes, unspecified: Secondary | ICD-10-CM

## 2011-11-27 ENCOUNTER — Other Ambulatory Visit (HOSPITAL_COMMUNITY): Payer: Self-pay | Admitting: Internal Medicine

## 2011-11-27 ENCOUNTER — Ambulatory Visit (HOSPITAL_COMMUNITY)
Admission: RE | Admit: 2011-11-27 | Discharge: 2011-11-27 | Disposition: A | Discharge status: Home / Self Care | Payer: BC Managed Care – PPO | Source: Ambulatory Visit | Attending: Internal Medicine | Admitting: Internal Medicine

## 2011-11-27 DIAGNOSIS — R911 Solitary pulmonary nodule: Secondary | ICD-10-CM | POA: Insufficient documentation

## 2011-11-27 DIAGNOSIS — R599 Enlarged lymph nodes, unspecified: Secondary | ICD-10-CM | POA: Insufficient documentation

## 2011-11-27 DIAGNOSIS — K8020 Calculus of gallbladder without cholecystitis without obstruction: Secondary | ICD-10-CM | POA: Insufficient documentation

## 2011-11-27 DIAGNOSIS — K449 Diaphragmatic hernia without obstruction or gangrene: Secondary | ICD-10-CM | POA: Insufficient documentation

## 2011-11-27 DIAGNOSIS — R591 Generalized enlarged lymph nodes: Secondary | ICD-10-CM

## 2011-11-27 MED ORDER — IOHEXOL 300 MG/ML  SOLN
100.0000 mL | Freq: Once | INTRAMUSCULAR | Status: AC | PRN
  Administered 2011-11-27: 100 mL via INTRAVENOUS

## 2011-12-02 ENCOUNTER — Encounter (HOSPITAL_COMMUNITY): Payer: Self-pay | Admitting: Pharmacy Technician

## 2011-12-02 ENCOUNTER — Other Ambulatory Visit: Payer: Self-pay | Admitting: Physician Assistant

## 2011-12-03 ENCOUNTER — Emergency Department (HOSPITAL_COMMUNITY)
Admission: EM | Admit: 2011-12-03 | Discharge: 2011-12-03 | Disposition: A | Discharge status: Home / Self Care | Payer: BC Managed Care – PPO | Attending: Emergency Medicine | Admitting: Emergency Medicine

## 2011-12-03 ENCOUNTER — Encounter (HOSPITAL_COMMUNITY): Payer: Self-pay

## 2011-12-03 ENCOUNTER — Ambulatory Visit (HOSPITAL_COMMUNITY)
Admission: RE | Admit: 2011-12-03 | Discharge: 2011-12-03 | Disposition: A | Discharge status: Home / Self Care | Payer: BC Managed Care – PPO | Source: Ambulatory Visit | Attending: Internal Medicine | Admitting: Internal Medicine

## 2011-12-03 DIAGNOSIS — E7800 Pure hypercholesterolemia, unspecified: Secondary | ICD-10-CM | POA: Insufficient documentation

## 2011-12-03 DIAGNOSIS — F411 Generalized anxiety disorder: Secondary | ICD-10-CM | POA: Insufficient documentation

## 2011-12-03 DIAGNOSIS — R4589 Other symptoms and signs involving emotional state: Secondary | ICD-10-CM

## 2011-12-03 DIAGNOSIS — F419 Anxiety disorder, unspecified: Secondary | ICD-10-CM

## 2011-12-03 DIAGNOSIS — R599 Enlarged lymph nodes, unspecified: Secondary | ICD-10-CM | POA: Insufficient documentation

## 2011-12-03 DIAGNOSIS — F17200 Nicotine dependence, unspecified, uncomplicated: Secondary | ICD-10-CM | POA: Insufficient documentation

## 2011-12-03 DIAGNOSIS — F3289 Other specified depressive episodes: Secondary | ICD-10-CM | POA: Insufficient documentation

## 2011-12-03 DIAGNOSIS — F32A Depression, unspecified: Secondary | ICD-10-CM

## 2011-12-03 DIAGNOSIS — F489 Nonpsychotic mental disorder, unspecified: Secondary | ICD-10-CM | POA: Insufficient documentation

## 2011-12-03 DIAGNOSIS — Z79899 Other long term (current) drug therapy: Secondary | ICD-10-CM | POA: Insufficient documentation

## 2011-12-03 DIAGNOSIS — F329 Major depressive disorder, single episode, unspecified: Secondary | ICD-10-CM | POA: Insufficient documentation

## 2011-12-03 DIAGNOSIS — E079 Disorder of thyroid, unspecified: Secondary | ICD-10-CM | POA: Insufficient documentation

## 2011-12-03 LAB — CBC WITH DIFFERENTIAL/PLATELET
HCT: 34.6 % — ABNORMAL LOW (ref 36.0–46.0)
Hemoglobin: 11.4 g/dL — ABNORMAL LOW (ref 12.0–15.0)
Lymphocytes Relative: 29 % (ref 12–46)
Lymphs Abs: 1.4 10*3/uL (ref 0.7–4.0)
MCHC: 32.9 g/dL (ref 30.0–36.0)
Monocytes Absolute: 0.6 10*3/uL (ref 0.1–1.0)
Monocytes Relative: 11 % (ref 3–12)
Neutro Abs: 2.8 10*3/uL (ref 1.7–7.7)
Neutrophils Relative %: 56 % (ref 43–77)
RBC: 4.17 MIL/uL (ref 3.87–5.11)
WBC: 5 10*3/uL (ref 4.0–10.5)

## 2011-12-03 LAB — COMPREHENSIVE METABOLIC PANEL
Albumin: 3.4 g/dL — ABNORMAL LOW (ref 3.5–5.2)
Alkaline Phosphatase: 101 U/L (ref 39–117)
BUN: 8 mg/dL (ref 6–23)
CO2: 26 mEq/L (ref 19–32)
Chloride: 107 mEq/L (ref 96–112)
Creatinine, Ser: 0.7 mg/dL (ref 0.50–1.10)
GFR calc non Af Amer: >90 mL/min (ref >90)
Glucose, Bld: 111 mg/dL — ABNORMAL HIGH (ref 70–99)
Potassium: 3.7 mEq/L (ref 3.5–5.1)
Total Bilirubin: 0.3 mg/dL (ref 0.3–1.2)

## 2011-12-03 LAB — PROTIME-INR
INR: 1 (ref 0.00–1.49)
Prothrombin Time: 13.1 seconds (ref 11.6–15.2)

## 2011-12-03 LAB — APTT: aPTT: 35 seconds (ref 24–37)

## 2011-12-03 LAB — CBC
HCT: 34.8 % — ABNORMAL LOW (ref 36.0–46.0)
Hemoglobin: 11.7 g/dL — ABNORMAL LOW (ref 12.0–15.0)
MCHC: 33.6 g/dL (ref 30.0–36.0)
RBC: 4.2 MIL/uL (ref 3.87–5.11)

## 2011-12-03 LAB — ETHANOL: Alcohol, Ethyl (B): <11 mg/dL (ref 0–11)

## 2011-12-03 MED ORDER — MIDAZOLAM HCL 2 MG/2ML IJ SOLN
INTRAMUSCULAR | Status: AC
  Filled 2011-12-03: qty 4

## 2011-12-03 MED ORDER — FENTANYL CITRATE 0.05 MG/ML IJ SOLN
INTRAMUSCULAR | Status: AC | PRN
  Administered 2011-12-03 (×2): 25 ug via INTRAVENOUS
  Administered 2011-12-03: 50 ug via INTRAVENOUS

## 2011-12-03 MED ORDER — MIDAZOLAM HCL 2 MG/2ML IJ SOLN
INTRAMUSCULAR | Status: AC | PRN
  Administered 2011-12-03: 0.5 mg via INTRAVENOUS
  Administered 2011-12-03: 1 mg via INTRAVENOUS
  Administered 2011-12-03: 0.5 mg via INTRAVENOUS

## 2011-12-03 MED ORDER — SODIUM CHLORIDE 0.9 % IV SOLN
INTRAVENOUS | Status: DC
  Administered 2011-12-03: 13:00:00 via INTRAVENOUS

## 2011-12-03 MED ORDER — LEVOTHYROXINE SODIUM 150 MCG PO TABS: 300.0000 ug | ORAL_TABLET | Freq: Every day | ORAL | Status: DC

## 2011-12-03 MED ORDER — SIMVASTATIN 40 MG PO TABS: 40.0000 mg | ORAL_TABLET | Freq: Every day | ORAL | Status: DC

## 2011-12-03 MED ORDER — FENTANYL CITRATE 0.05 MG/ML IJ SOLN
INTRAMUSCULAR | Status: AC
  Filled 2011-12-03: qty 4

## 2011-12-03 MED ORDER — ALPRAZOLAM 0.25 MG PO TABS: 1.0000 mg | ORAL_TABLET | Freq: Three times a day (TID) | ORAL | Status: DC | PRN

## 2011-12-03 NOTE — Progress Notes (Signed)
Paged Cindy with case management.

## 2011-12-03 NOTE — ED Provider Notes (Addendum)
History     CSN: [id number]  Arrival date & time 12/03/11  1624   First MD Initiated Contact with Patient 12/03/11 1631      Chief Complaint  Patient presents with  . Suicidal    (Consider location/radiation/quality/duration/timing/severity/associated sxs/prior treatment) HPI Comments: Patient is a 51 year old female who was sent to the emergency department from interventional radiology for having suicidal ideations when questioned by nurse.  On arrival to the emergency department patient she is still tearful explaining that "times have been really rough on her."  Patient states that she has had a suicidal attempt before in the past years ago, that she overdosed with pills but does not recall the type of pill.  She currently states that she has a plan as well that she sometimes gets the feeling he does drive into a wall but she did not want to hurt anybody else. Pt used to be followed by a psychiatrist, Dr. Sharl Ma, but has been unable to see her over the last year d/t financial issues. Pt denies current HI, however she states that some times she gets really mad, but "I know how to check myself." Pt has been on Seroquel in past, but is not currently taking any medications for depression or "possible diagnosis of schizophrenia." Reports positive visual hallucinations. Good support system at home, lives with son.   The history is provided by the patient.    Past Medical History  Diagnosis Date  . Thyroid disease   . Hypercholesteremia   . Anxiety     Past Surgical History  Procedure Date  . Abdominal hysterectomy   . Thyroidectomy   . Appendectomy     History reviewed. No pertinent family history.  History  Substance Use Topics  . Smoking status: Current Every Day Smoker    Types: Cigarettes  . Smokeless tobacco: Not on file  . Alcohol Use: No    OB History    Grav Para Term Preterm Abortions TAB SAB Ect Mult Living                  Review of Systems  Constitutional:  Negative for fever, chills and appetite change.  HENT: Negative for congestion.   Eyes: Negative for visual disturbance.  Respiratory: Negative for shortness of breath.   Cardiovascular: Negative for chest pain and leg swelling.  Gastrointestinal: Negative for abdominal pain.  Genitourinary: Negative for dysuria, urgency and frequency.  Skin: Negative for rash.  Neurological: Negative for dizziness, syncope, weakness, light-headedness, numbness and headaches.  Psychiatric/Behavioral: Positive for suicidal ideas, hallucinations and sleep disturbance. Negative for confusion and self-injury. The patient is not nervous/anxious.   All other systems reviewed and are negative.    Allergies  Review of patient's allergies indicates no known allergies.  Home Medications   Current Outpatient Rx  Name  Route  Sig  Dispense  Refill  . ALPRAZOLAM 1 MG PO TABS   Oral   Take 1 mg by mouth 3 (three) times daily as needed. For anxiety.         Marland Kitchen LEVOTHYROXINE SODIUM 150 MCG PO TABS   Oral   Take 300 mcg by mouth daily.         Marland Kitchen PIMECROLIMUS 1 % EX CREA   Topical   Apply 1 application topically 2 (two) times daily as needed. For eczema         . PRAVASTATIN SODIUM 80 MG PO TABS   Oral   Take 80 mg by mouth daily.         Marland Kitchen  TRIAMCINOLONE ACETONIDE 0.5 % EX CREA   Topical   Apply 1 application topically 2 (two) times daily as needed. For eczema           BP 123/80  Pulse 79  Temp 98.6 F (37 C) (Oral)  Resp 16  SpO2 100%  Physical Exam  Vitals reviewed. Constitutional: She is oriented to person, place, and time. She appears well-developed and well-nourished. No distress.  HENT:  Head: Normocephalic and atraumatic.  Mouth/Throat: Oropharynx is clear and moist. No oropharyngeal exudate.  Eyes: Conjunctivae normal and EOM are normal. Pupils are equal, round, and reactive to light. No scleral icterus.  Neck: Normal range of motion. Neck supple. No tracheal deviation  present. No thyromegaly present.  Cardiovascular: Normal rate, regular rhythm, normal heart sounds and intact distal pulses.   Pulmonary/Chest: Effort normal and breath sounds normal. No stridor. No respiratory distress. She has no wheezes.  Abdominal: Soft.  Musculoskeletal: Normal range of motion. She exhibits no edema and no tenderness.  Neurological: She is alert and oriented to person, place, and time. Coordination normal.  Skin: Skin is warm and dry. No rash noted. She is not diaphoretic. No erythema. No pallor.  Psychiatric: She has a normal mood and affect. Her behavior is normal.    ED Course  Procedures (including critical care time)   Labs Reviewed  CBC WITH DIFFERENTIAL  COMPREHENSIVE METABOLIC PANEL  URINE RAPID DRUG SCREEN (HOSP PERFORMED)  ETHANOL   US Biopsy  12/03/2011  *RADIOLOGY REPORT*  Indication: Remote history of thyroidectomy, now with supraclavicular lymphadenopathy.  ULTRASOUND GUIDED RIGHT SUPRACLAVICULAR LYMPH NODE BIOPSY  Comparisons: CT chest, abdomen and pelvis - 11/27/2011  Medications: Fentanyl 100 mcg IV; Versed 2 mg IV  Total Moderate Sedation Time: 14 minutes  Complications: None immediate  Findings / Technique:  Informed written consent was obtained from the patient after a discussion of the risks, benefits and alternatives to treatment. Questions regarding the procedure were encouraged and answered. Initial ultrasound scanning demonstrated an enlarged right supraclavicular lymph node correlating with the patient's palpable area of concern.  An ultrasound image was saved for documentation purposes.  The procedure was planned.  A timeout was performed prior to the initiation of the procedure.  The operative was prepped and draped in the usual sterile fashion, and a sterile drape was applied covering the operative field.  A timeout was performed prior to the initiation of the procedure. Local anesthesia was provided with 1% lidocaine with epinephrine.  Under  direct ultrasound guidance, an 18 gauge core needle device was utilized to obtain to obtain 5 core needle biopsied of the enlarged right supraclavicular lymph node.  The samples were placed in saline and submitted to pathology.  The needle was removed and hemostasis was achieved with manual compression.  Post procedure scan was negative for significant hematoma.  A dressing was placed.  The patient tolerated the procedure well without immediate postprocedural complication.  Impression:  Successful ultrasound guided right supraclavicular lymph node biopsy.   Original Report Authenticated By: Tacey Ruiz, MD    6:41 PM - Pt in room with sister and son, currently denying SI/HI. Requesting discharge. Son reports that this is how his mother is and that she is not suicidal. He understands that she is depressed and states he will set up appropriate follow up as an OP with her old psychiatrist. Based on suicide risk estimation below & the fact that pt told multiple people in the ER that she was suicidal, i  am not comfortable discharging at this time. Telepsych still pending. ACT consulted and is aware of pt. We have discussed that If the patient gets discharged from hospital and later feels she was becoming unsafe, instead of acting on an impulse of self harm he will contact the crisis line, speak to her son  about it,Dr. Sharl Ma or return to the emergency department.   No diagnosis found.    MDM   Patient presents to the ER with a number of risk factors for suicide for example the patient has a history of Depression, insomnia, past suicide attempts, & medication noncompliance d/t funds. In addition the patient has a number of protective factors for example the patient does not appear to be psychotic, was speaking openly about her current situation, discusses future plans & they have a good support system (lives with son who is in room). Under these circumstances I would conservatively estimate the suicide risk to  be moderate-high. Current Plan is to have patient be evaluated by ACT & telepsych for further assessment on weather or not to be placed inpatient.  Patient has been cleared to move to [facility] pending further assessment.    Addendum, 9:23 PM - Pt evaluated by Dr. Rob Bunting who believes she is stable and cleared for dc. Pt cont tx w Xanax by PCP. No other recommendations seen.     Jaci Carrel, PA-C 12/03/11 1855  Jaci Carrel, PA-C 12/03/11 2125

## 2011-12-03 NOTE — Progress Notes (Signed)
Spoke with patient regarding suicidal thoughts. Patient states she has been having thoughts of harming herself. Patient refuses to go to the ER. States that she sees a doctor about her depression and will not harm herself in any way when she gets discharged today. Her son will stay with her at home tonight.

## 2011-12-03 NOTE — Progress Notes (Signed)
Called by Shawna Orleans in Short Stay and Wandra Mannan, Asst. Director CSW re: ? Suicidal presently in Radiology for a procedure.  CSW spoke with pt who confirmed that she was having thoughts about hurting herself and had a plan.  Pt stated that she could not tell CSW what her plan was.  Pt tearful, looking away from CSW as she spoke.  Pt admitted to one past attempt and told CSW that she could no longer afford to see her psychiatrist and had been off her psych meds for sometime.  Pt told CSW that her family was aware of her suicidal thoughts and that she had recently experienced a lot of family tradgedy as well as her own worrisome health issues.  Emotional support provided.  CSW explained to pt that she would be seen by ACT in the ED for an assessment.  While not initally agreeable, pt agreed to be seen in the ED after it was explained to her that she could be IVC'ed.  Pt with many supportive family members at bedside.  ACT Team counselor, Zenovia Jordan informed about pt coming to the ED for eval.

## 2011-12-03 NOTE — ED Notes (Addendum)
Pt and family becoming increasingly agitated due to length of stay. Pt states she is not happy with the food that was offered to her and not happy nobody has offered to give her her normal medications. Lisette PA-C made aware

## 2011-12-03 NOTE — ED Notes (Signed)
Social Work here to eval pt, Pt sent to ED for evaluation by ACT team.  Family notified, pt very tearful, offer support all pt belongings sent with family

## 2011-12-03 NOTE — ED Provider Notes (Signed)
Medical screening examination/treatment/procedure(s) were performed by non-physician practitioner and as supervising physician I was immediately available for consultation/collaboration.   Richardean Canal, MD 12/03/11 1910

## 2011-12-03 NOTE — H&P (Signed)
Toni Moses is an 51 y.o. female.   Chief Complaint: enlarged right neck lymph node HPI: Patient with history of lymphadenopathy of mediastinal/supraclavicular/axillary regions noted on recent CT presents today for US guided right supraclavicular lymph node biopsy.   Past Medical History  Diagnosis Date  . Thyroid disease   . Hypercholesteremia   . Anxiety     Past Surgical History  Procedure Date  . Abdominal hysterectomy   . Thyroidectomy   . Appendectomy   ventral hernia repair with mesh  No family history on file. Social History:  reports that she has been smoking Cigarettes.  She does not have any smokeless tobacco history on file. She reports that she does not drink alcohol or use illicit drugs.  Allergies: No Known Allergies  Current outpatient prescriptions:ALPRAZolam (XANAX) 1 MG tablet, Take 1 mg by mouth 3 (three) times daily as needed. For anxiety., Disp: , Rfl: ;  levothyroxine (SYNTHROID, LEVOTHROID) 150 MCG tablet, Take 300 mcg by mouth daily., Disp: , Rfl: ;  pimecrolimus (ELIDEL) 1 % cream, Apply 1 application topically 2 (two) times daily., Disp: , Rfl: ;  pravastatin (PRAVACHOL) 80 MG tablet, Take 80 mg by mouth daily., Disp: , Rfl:  triamcinolone cream (KENALOG) 0.5 %, Apply 1 application topically daily., Disp: , Rfl:  Current facility-administered medications:0.9 %  sodium chloride infusion, , Intravenous, Continuous, Simonne Come, MD, Last Rate: 20 mL/hr at 12/03/11 1241   Results for orders placed during the hospital encounter of 12/03/11 (from the past 48 hour(s))  APTT     Status: Normal   Collection Time   12/03/11  1:00 PM      Component Value Range Comment   aPTT 35  24 - 37 seconds   CBC     Status: Abnormal   Collection Time   12/03/11  1:00 PM      Component Value Range Comment   WBC 5.4  4.0 - 10.5 K/uL    RBC 4.20  3.87 - 5.11 MIL/uL    Hemoglobin 11.7 (*) 12.0 - 15.0 g/dL    HCT 32.4 (*) 40.1 - 46.0 %    MCV 82.9  78.0 - 100.0 fL    MCH 27.9  26.0 - 34.0 pg    MCHC 33.6  30.0 - 36.0 g/dL    RDW 02.7  25.3 - 66.4 %    Platelets 340  150 - 400 K/uL   PROTIME-INR     Status: Normal   Collection Time   12/03/11  1:00 PM      Component Value Range Comment   Prothrombin Time 13.1  11.6 - 15.2 seconds    INR 1.00  0.00 - 1.49    No results found.  Review of Systems  Constitutional: Positive for weight loss. Negative for fever.       Night sweats  Respiratory: Negative for cough and shortness of breath.   Cardiovascular: Negative for chest pain.  Gastrointestinal: Positive for abdominal pain. Negative for nausea and vomiting.  Musculoskeletal: Positive for back pain.  Neurological: Negative for headaches.    Blood pressure 130/85, pulse 88, temperature 97.6 F (36.4 C), temperature source Oral, resp. rate 18, height 5\' 6"  (1.676 m), weight 174 lb (78.926 kg), SpO2 97.00%. Physical Exam  Constitutional: She is oriented to person, place, and time. She appears well-developed and well-nourished.  Neck:       Palpable rt supraclavicular lymph node, NT  Cardiovascular: Normal rate and regular rhythm.   Respiratory: Effort normal  and breath sounds normal.  GI: Soft. Bowel sounds are normal.  Musculoskeletal: Normal range of motion. She exhibits no edema.  Neurological: She is alert and oriented to person, place, and time.  Psychiatric:       ?suicidal ideation per nurse     Assessment/Plan Patient with scattered lymphadenopathy of mediastinal/supraclavicular/axillary regions. Plan is for US guided biopsy of an enlarged right supraclavicular lymph node today. Details/risks of procedure d/w pt/family with their understanding and consent.  Kearsten Ginther,D KEVIN 12/03/2011, 1:53 PM

## 2011-12-03 NOTE — ED Notes (Signed)
Social WOrker here to see pt re: questionable suicidal ideations

## 2011-12-03 NOTE — Procedures (Signed)
Technically successful US guided biopsy of right supraclavicular lymph node.  No immediate complications.  

## 2011-12-03 NOTE — ED Notes (Signed)
Pt sts she was in IRU and when asked about history of suicide the patient confirmed past thoughts of suicidal ideation.  Pt sts she told the nurse that she wasn't suicidal but she knows a couple of people she wants to kill.  Pt sts she sees a psychiatrist and is on Zanax and Zoloft.

## 2011-12-03 NOTE — ED Provider Notes (Signed)
Medical screening examination/treatment/procedure(s) were performed by non-physician practitioner and as supervising physician I was immediately available for consultation/collaboration.   Marvin Grabill H Smt [place], MD 12/03/11 2329 

## 2012-01-08 ENCOUNTER — Encounter (HOSPITAL_COMMUNITY): Payer: Self-pay | Admitting: *Deleted

## 2012-01-08 ENCOUNTER — Emergency Department (INDEPENDENT_AMBULATORY_CARE_PROVIDER_SITE_OTHER)
Admission: EM | Admit: 2012-01-08 | Discharge: 2012-01-08 | Disposition: A | Discharge status: Home / Self Care | Payer: BC Managed Care – PPO | Source: Home / Self Care | Attending: Emergency Medicine | Admitting: Emergency Medicine

## 2012-01-08 DIAGNOSIS — L259 Unspecified contact dermatitis, unspecified cause: Secondary | ICD-10-CM

## 2012-01-08 DIAGNOSIS — L309 Dermatitis, unspecified: Secondary | ICD-10-CM

## 2012-01-08 MED ORDER — METHYLPREDNISOLONE ACETATE 80 MG/ML IJ SUSP
80.0000 mg | Freq: Once | INTRAMUSCULAR | Status: AC
  Administered 2012-01-08: 80 mg via INTRAMUSCULAR

## 2012-01-08 MED ORDER — PREDNISONE 10 MG PO TABS: ORAL_TABLET | ORAL | Status: DC

## 2012-01-08 MED ORDER — METHYLPREDNISOLONE ACETATE 80 MG/ML IJ SUSP
INTRAMUSCULAR | Status: AC
  Filled 2012-01-08: qty 1

## 2012-01-08 MED ORDER — TRIAMCINOLONE ACETONIDE 0.1 % EX CREA: TOPICAL_CREAM | Freq: Three times a day (TID) | CUTANEOUS | Status: DC

## 2012-01-08 MED ORDER — HYDROXYZINE HCL 25 MG PO TABS: 25.0000 mg | ORAL_TABLET | Freq: Four times a day (QID) | ORAL | Status: DC

## 2012-01-08 NOTE — ED Provider Notes (Signed)
Chief Complaint  Patient presents with  . Rash    History of Present Illness:   The patient is a 51 year old female with a long-standing history of eczema. She usually controls this with corticosteroid creams and general skin care. She has had a flareup over the past week that was precipitated by washing some dishes with Clorox in the dishwater. She did not use close. This caused her hands to break out and then she developed generalized itching and worsening rash. None of the areas are pustular. She denies any fever. She's had no difficulty breathing or swelling of the throat. She denies any history of asthma or allergies.  Review of Systems:  Other than noted above, the patient denies any of the following symptoms: Systemic:  No fever, chills, sweats, weight loss, or fatigue. ENT:  No nasal congestion, rhinorrhea, sore throat, swelling of lips, tongue or throat. Resp:  No cough, wheezing, or shortness of breath. Skin:  No rash, itching, nodules, or suspicious lesions.  PMFSH:  Past medical history, family history, social history, meds, and allergies were reviewed.  Physical Exam:   Vital signs:  BP 151/72  Pulse 89  Temp 98.3 F (36.8 C) (Oral)  Resp 16  SpO2 98% Gen:  Alert, oriented, in no distress. ENT:  Pharynx clear, no intraoral lesions, moist mucous membranes. Lungs:  Clear to auscultation. Skin:  She has a typical, generalized eczematous rash worse on the hands, wrists, and forearms but also some on the back, chest, abdomen, legs, and neck as well. None of the areas appear to be infected.  Course in Urgent Care Center:   She was given Depo-Medrol 80 mg IM and tolerated this well without any immediate side effects.  Assessment:  The encounter diagnosis was Eczema.  Plan:   1.  The following meds were prescribed:   New Prescriptions   HYDROXYZINE (ATARAX/VISTARIL) 25 MG TABLET    Take 1 tablet (25 mg total) by mouth every 6 (six) hours.   PREDNISONE (DELTASONE) 10 MG TABLET     Take 4 tabs daily for 4 days, 3 tabs daily for 4 days, 2 tabs daily for 4 days, then 1 tab daily for 4 days.   TRIAMCINOLONE CREAM (KENALOG) 0.1 %    Apply topically 3 (three) times daily.   2.  The patient was instructed in symptomatic care and handouts were given. 3.  The patient was told to return if becoming worse in any way, if no better in 3 or 4 days, and given some red flag symptoms that would indicate earlier return.     Reuben Likes, MD 01/08/12 [phone]

## 2012-01-08 NOTE — ED Notes (Signed)
Pt  Reports  Symptoms  Of  Rash     Since  Yesterday  She      Reports   She     Has    eczma       And  Had  A  Flair  Up

## 2012-03-05 ENCOUNTER — Emergency Department (HOSPITAL_COMMUNITY)
Admission: EM | Admit: 2012-03-05 | Discharge: 2012-03-05 | Disposition: A | Discharge status: Home / Self Care | Payer: BC Managed Care – PPO | Attending: Emergency Medicine | Admitting: Emergency Medicine

## 2012-03-05 DIAGNOSIS — H5789 Other specified disorders of eye and adnexa: Secondary | ICD-10-CM | POA: Insufficient documentation

## 2012-03-05 DIAGNOSIS — F17200 Nicotine dependence, unspecified, uncomplicated: Secondary | ICD-10-CM | POA: Insufficient documentation

## 2012-03-05 DIAGNOSIS — E7800 Pure hypercholesterolemia, unspecified: Secondary | ICD-10-CM | POA: Insufficient documentation

## 2012-03-05 DIAGNOSIS — H547 Unspecified visual loss: Secondary | ICD-10-CM | POA: Insufficient documentation

## 2012-03-05 DIAGNOSIS — F411 Generalized anxiety disorder: Secondary | ICD-10-CM | POA: Insufficient documentation

## 2012-03-05 DIAGNOSIS — E079 Disorder of thyroid, unspecified: Secondary | ICD-10-CM | POA: Insufficient documentation

## 2012-03-05 DIAGNOSIS — Z79899 Other long term (current) drug therapy: Secondary | ICD-10-CM | POA: Insufficient documentation

## 2012-03-05 DIAGNOSIS — H53149 Visual discomfort, unspecified: Secondary | ICD-10-CM | POA: Insufficient documentation

## 2012-03-05 DIAGNOSIS — H5710 Ocular pain, unspecified eye: Secondary | ICD-10-CM

## 2012-03-05 MED ORDER — TETRACAINE HCL 0.5 % OP SOLN
2.0000 [drp] | Freq: Once | OPHTHALMIC | Status: AC
  Administered 2012-03-05: 2 [drp] via OPHTHALMIC

## 2012-03-05 MED ORDER — OXYCODONE-ACETAMINOPHEN 5-325 MG PO TABS
1.0000 | ORAL_TABLET | Freq: Once | ORAL | Status: AC
  Administered 2012-03-05: 1 via ORAL
  Filled 2012-03-05: qty 1

## 2012-03-05 MED ORDER — FLUORESCEIN SODIUM 1 MG OP STRP
1.0000 | ORAL_STRIP | Freq: Once | OPHTHALMIC | Status: AC
  Administered 2012-03-05: 1 via OPHTHALMIC

## 2012-03-05 NOTE — ED Notes (Signed)
RT eye redness and achiness since last night per pt.  States "it's been running"  Denies any other problems

## 2012-03-05 NOTE — ED Provider Notes (Signed)
History     CSN: [id number]  Arrival date & time 03/05/12  1054   First MD Initiated Contact with Patient 03/05/12 1220      Chief Complaint  Patient presents with  . Conjunctivitis    (Consider location/radiation/quality/duration/timing/severity/associated sxs/prior treatment) Patient is a 52 y.o. female presenting with conjunctivitis. The history is provided by the patient. No language interpreter was used.  Conjunctivitis  The current episode started yesterday. The problem has been gradually worsening. The problem is moderate. The symptoms are relieved by one or more prescription drugs (tried antibiotic ointment (does not know name)). Associated symptoms include decreased vision, photophobia, eye pain and eye redness. Pertinent negatives include no fever, no double vision, no eye itching, no nausea, no vomiting, no ear discharge, no cough and no URI. The eye pain is moderate. The right eye is affected.The eye pain is associated with movement. The eyelid exhibits redness. She has been eating and drinking normally. There were no sick contacts. She has received no recent medical care.  52 yo female with c/o R eye redness and pain x > 24 hours.  States that her eye hurts with movement and her vision is decreased in her R eye.  She has a pmh of cataracts and ? Some other eye disease that she is not sure of.  Experiencing photophobia. Pain is 8/10.  [facility].  pmh thyroid disease, high cholesterol and anxiety.   Past Medical History  Diagnosis Date  . Thyroid disease   . Hypercholesteremia   . Anxiety     Past Surgical History  Procedure Laterality Date  . Abdominal hysterectomy    . Thyroidectomy    . Appendectomy      No family history on file.  History  Substance Use Topics  . Smoking status: Current Every Day Smoker    Types: Cigarettes  . Smokeless tobacco: Not on file  . Alcohol Use: No    OB History   Grav Para Term Preterm  Abortions TAB SAB Ect Mult Living                  Review of Systems  Constitutional: Negative.  Negative for fever.  HENT: Negative.  Negative for ear discharge.   Eyes: Positive for photophobia, pain and redness. Negative for double vision and itching.  Respiratory: Negative.  Negative for cough.   Cardiovascular: Negative.   Gastrointestinal: Negative.  Negative for nausea and vomiting.  Neurological: Negative.   Psychiatric/Behavioral: Negative.   All other systems reviewed and are negative.    Allergies  Review of patient's allergies indicates no known allergies.  Home Medications   Current Outpatient Rx  Name  Route  Sig  Dispense  Refill  . ALPRAZolam (XANAX) 1 MG tablet   Oral   Take 1 mg by mouth 3 (three) times daily as needed. For anxiety.         Marland Kitchen amLODipine (NORVASC) 5 MG tablet   Oral   Take 5 mg by mouth daily.         Marland Kitchen levothyroxine (SYNTHROID, LEVOTHROID) 200 MCG tablet   Oral   Take 200 mcg by mouth daily.         . pravastatin (PRAVACHOL) 80 MG tablet   Oral   Take 80 mg by mouth daily.         . QUEtiapine (SEROQUEL) 50 MG tablet   Oral   Take 50 mg by mouth at bedtime.         Marland Kitchen  triamcinolone cream (KENALOG) 0.5 %   Topical   Apply 1 application topically 2 (two) times daily as needed. For eczema           BP 130/77  Pulse 81  Temp(Src) 98.3 F (36.8 C) (Oral)  Ht 5\' 5"  (1.651 m)  Wt 182 lb (82.555 kg)  BMI 30.29 kg/m2  SpO2 97%  Physical Exam  Nursing note and vitals reviewed. Constitutional: She is oriented to person, place, and time. She appears well-developed and well-nourished.  HENT:  Head: Normocephalic and atraumatic.  Eyes: Conjunctivae and EOM are normal. Pupils are equal, round, and reactive to light. No foreign bodies found. Right eye exhibits no discharge and no exudate. No foreign body present in the right eye. Scleral icterus is present.    Visual field unremarkable. Visual aquity decreased in the R  eye. CN III, IV, and VI in tact.  Pupil reaction/ accomidation  normal  Neck: Normal range of motion. Neck supple.  Cardiovascular: Normal rate.   Pulmonary/Chest: Effort normal.  Abdominal: Soft.  Musculoskeletal: Normal range of motion. She exhibits no edema and no tenderness.  Neurological: She is alert and oriented to person, place, and time. She has normal reflexes.  Skin: Skin is warm and dry.  Psychiatric: She has a normal mood and affect.    ED Course  Procedures (including critical care time) Unable to calibrate tono pen to get a pressure reading.   Labs Reviewed - No data to display No results found.   No diagnosis found.    MDM  R eye infection.  She will go to Dr. Zannie Kehr office directly to be seen.  ? Bacterial conjuncivitis with ? Some vision loss and pain with movement.  Percocet in the ER for pain.   Woods lamp exam with fluroscene done in the ER. No corneal abrasion detected.         Remi Haggard, NP 03/05/12 1654

## 2012-03-06 NOTE — ED Provider Notes (Signed)
Medical screening examination/treatment/procedure(s) were performed by non-physician practitioner and as supervising physician I was immediately available for consultation/collaboration.   Dixie Coppa, MD 03/06/12 1553 

## 2012-04-07 ENCOUNTER — Telehealth: Payer: Self-pay | Admitting: *Deleted

## 2012-04-07 NOTE — Telephone Encounter (Signed)
NANM on phone number we have listed and what was provided by referring.  I called and left a message for Amy at referring to see if she had any other numbers for this pt.

## 2012-04-07 NOTE — Telephone Encounter (Signed)
Toni Moses, from [facility] called and wanted a phone number for [name]. I gave her [phone] cell.

## 2012-04-12 ENCOUNTER — Telehealth: Payer: Self-pay | Admitting: Oncology

## 2012-04-12 NOTE — Telephone Encounter (Signed)
CALLED PT IN RE TO NP APPT ON 03/26 PER PT SHE HAS A FUNERAL TO ATTEND AND WILL CALL BACK ON Thursday TO SCHEDULE.

## 2012-04-19 ENCOUNTER — Other Ambulatory Visit: Payer: Self-pay | Admitting: *Deleted

## 2012-04-19 MED ORDER — ALPRAZOLAM 1 MG PO TABS: 1.0000 mg | ORAL_TABLET | Freq: Three times a day (TID) | ORAL | Status: DC | PRN

## 2012-06-01 ENCOUNTER — Encounter: Payer: Self-pay | Admitting: *Deleted

## 2012-06-01 ENCOUNTER — Other Ambulatory Visit: Payer: Self-pay | Admitting: Geriatric Medicine

## 2012-06-01 MED ORDER — LEVOTHYROXINE SODIUM 200 MCG PO TABS: 200.0000 ug | ORAL_TABLET | Freq: Every day | ORAL | Status: DC

## 2012-06-02 ENCOUNTER — Ambulatory Visit: Payer: Self-pay | Admitting: Internal Medicine

## 2012-08-08 ENCOUNTER — Other Ambulatory Visit: Payer: Self-pay | Admitting: Internal Medicine

## 2012-08-10 ENCOUNTER — Other Ambulatory Visit: Payer: Self-pay | Admitting: Geriatric Medicine

## 2012-08-10 MED ORDER — ALPRAZOLAM 1 MG PO TABS: 1.0000 mg | ORAL_TABLET | Freq: Three times a day (TID) | ORAL | Status: DC | PRN

## 2012-08-25 ENCOUNTER — Encounter: Payer: Self-pay | Admitting: Internal Medicine

## 2012-08-25 ENCOUNTER — Ambulatory Visit (INDEPENDENT_AMBULATORY_CARE_PROVIDER_SITE_OTHER): Payer: BC Managed Care – PPO | Admitting: Internal Medicine

## 2012-08-25 VITALS — BP 142/86 | HR 100 | Temp 98.1°F | Resp 13 | Ht 65.0 in | Wt 177.4 lb

## 2012-08-25 DIAGNOSIS — F329 Major depressive disorder, single episode, unspecified: Secondary | ICD-10-CM

## 2012-08-25 DIAGNOSIS — F32A Depression, unspecified: Secondary | ICD-10-CM

## 2012-08-25 DIAGNOSIS — E039 Hypothyroidism, unspecified: Secondary | ICD-10-CM

## 2012-08-25 DIAGNOSIS — E1142 Type 2 diabetes mellitus with diabetic polyneuropathy: Secondary | ICD-10-CM | POA: Insufficient documentation

## 2012-08-25 DIAGNOSIS — E7800 Pure hypercholesterolemia, unspecified: Secondary | ICD-10-CM

## 2012-08-25 DIAGNOSIS — F419 Anxiety disorder, unspecified: Secondary | ICD-10-CM | POA: Insufficient documentation

## 2012-08-25 DIAGNOSIS — F411 Generalized anxiety disorder: Secondary | ICD-10-CM

## 2012-08-25 DIAGNOSIS — E119 Type 2 diabetes mellitus without complications: Secondary | ICD-10-CM

## 2012-08-25 DIAGNOSIS — I10 Essential (primary) hypertension: Secondary | ICD-10-CM

## 2012-08-25 MED ORDER — SIMVASTATIN 80 MG PO TABS: ORAL_TABLET | ORAL | Status: DC

## 2012-08-25 MED ORDER — TRAMADOL HCL 50 MG PO TABS: ORAL_TABLET | ORAL | Status: DC

## 2012-08-25 MED ORDER — VILAZODONE HCL 40 MG PO TABS: 40.0000 mg | ORAL_TABLET | Freq: Every day | ORAL | Status: DC

## 2012-08-25 MED ORDER — AMLODIPINE BESYLATE 5 MG PO TABS: 5.0000 mg | ORAL_TABLET | Freq: Every day | ORAL | Status: DC

## 2012-08-25 MED ORDER — PRAVASTATIN SODIUM 80 MG PO TABS: 80.0000 mg | ORAL_TABLET | Freq: Every day | ORAL | Status: DC

## 2012-08-25 MED ORDER — LEVOTHYROXINE SODIUM 200 MCG PO TABS: 200.0000 ug | ORAL_TABLET | Freq: Every day | ORAL | Status: DC

## 2012-08-25 MED ORDER — ALPRAZOLAM 1 MG PO TABS: 1.0000 mg | ORAL_TABLET | Freq: Three times a day (TID) | ORAL | Status: DC | PRN

## 2012-08-25 MED ORDER — QUETIAPINE FUMARATE 50 MG PO TABS: 50.0000 mg | ORAL_TABLET | Freq: Every day | ORAL | Status: DC

## 2012-08-25 NOTE — Progress Notes (Signed)
Patient ID: Toni Moses, female   DOB: 05/27/1960, 52 y.o.   MRN: [id number]  Chief Complaint  Patient presents with  . Medical Managment of Chronic Issues   Allergies  Allergen Reactions  . Citalopram    HPI 52 y/o female patient is here for routine visit. She has been going through a lot and feels low. She has stopped taking her mood medication by herself. Her husband recently had amputation of his other leg and is undergoing rehab and this is getting tough on her. She also had bleeding vessels on her right eye and is following with ophthalmologist on it.  She has been taking her bp and thyroid medications. Denies any other complaints  Review of Systems  Constitutional: Negative for fever, chills and diaphoresis.  HENT: Negative for congestion.   Respiratory: Negative for cough and shortness of breath.   Cardiovascular: Negative for chest pain, palpitations and leg swelling.  Gastrointestinal: Negative for heartburn, nausea, vomiting, abdominal pain and constipation.  Genitourinary: Negative for dysuria.  Musculoskeletal: Negative for falls.  Skin: Negative for rash.  Neurological: Negative for dizziness, seizures, weakness and headaches.  Psychiatric/Behavioral: Positive for depression. Negative for memory loss. The patient is nervous/anxious.    BP 142/86  Pulse 100  Temp(Src) 98.1 F (36.7 C) (Oral)  Resp 13  Ht 5\' 5"  (1.651 m)  Wt 177 lb 6.4 oz (80.468 kg)  BMI 29.52 kg/m2  Physical Exam  Constitutional: She is oriented to person, place, and time. No distress.  overweight  HENT:  Head: Normocephalic and atraumatic.  Mouth/Throat: Oropharynx is clear and moist.  Eyes: Pupils are equal, round, and reactive to light.  Injected right conjunctiva  Neck: Normal range of motion. Neck supple.  Mobile, non tender right supraclavicular LN  Cardiovascular: Normal rate, regular rhythm and normal heart sounds.   No murmur heard. Pulmonary/Chest: Effort normal and breath  sounds normal. No respiratory distress. She has no wheezes.  Abdominal: Soft. Bowel sounds are normal. She exhibits no distension and no mass.  Musculoskeletal: Normal range of motion. She exhibits no edema and no tenderness.  Neurological: She is alert and oriented to person, place, and time.  Skin: Skin is warm. She is not diaphoretic. No pallor.  Eczema present  Psychiatric: Judgment and thought content normal.  Tearful this visit   1  Procedures- 11/20/2011-Thyroid Ultrasound: Residual or recurrent tissue in the right thyroidectomy bed. Bilateral prominent supraclavicular nodes, largest on the right 11mm short axis diameter. 11/27/11- ct chest, abdomen and pelvis-numerous borderline enlarged and mildly enlarged mediastinal, supraclavicular and b/l axillary lymph node. multiple small pulmonary nodules in the right lung are non specific , largest of which measures 6 mm in RUL. follow up ct recommended in 612 months. small hiatal hernia. left sided Bochdalek hernia. no LAD within abdomen/ pelvis. cholelithiasis without findings to suggest acute cholecystitis. 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    ASSESSMENT/PLAN-  Depression- self stopped her meds. Explained the importance of not doing this, the duration the medication can take to show its effect and need of taking her medication. Will start her back on vibryid and also continue seroquel to help with her anxiety as well  Anxiety- continue alprazolam. Refills provided  Dm type 2- diet controlled. Check a1c  Hypothyroidism- continue levothyroxine, refill provided and check tsh  htn- bp remains stable. Continue norvasc  Hyperlipidemia- continue pravastatin and check flp    Dietary, exercise and life style modification counselling provided

## 2012-08-26 LAB — CBC WITH DIFFERENTIAL/PLATELET
Basophils Absolute: 0 10*3/uL (ref 0.0–0.2)
Hemoglobin: 11.8 g/dL (ref 11.1–15.9)
Immature Grans (Abs): 0 10*3/uL (ref 0.0–0.1)
Immature Granulocytes: 0 % (ref 0–2)
Lymphs: 15 % (ref 14–46)
MCHC: 34.8 g/dL (ref 31.5–35.7)
Monocytes: 7 % (ref 4–12)
RDW: 15.2 % (ref 12.3–15.4)

## 2012-08-26 LAB — HEMOGLOBIN A1C
Est. average glucose Bld gHb Est-mCnc: 128 mg/dL
Hgb A1c MFr Bld: 6.1 % — ABNORMAL HIGH (ref 4.8–5.6)

## 2012-08-26 LAB — COMPREHENSIVE METABOLIC PANEL
ALT: 11 IU/L (ref 0–32)
AST: 13 IU/L (ref 0–40)
Alkaline Phosphatase: 89 IU/L (ref 39–117)
BUN/Creatinine Ratio: 11 (ref 9–23)
CO2: 22 mmol/L (ref 18–29)
Calcium: 9.5 mg/dL (ref 8.7–10.2)
Chloride: 105 mmol/L (ref 97–108)
Potassium: 3.9 mmol/L (ref 3.5–5.2)
Sodium: 140 mmol/L (ref 134–144)

## 2012-08-26 LAB — LIPID PANEL
HDL: 91 mg/dL (ref >39)
Triglycerides: 172 mg/dL — ABNORMAL HIGH (ref 0–149)

## 2012-08-26 LAB — TSH: TSH: 0.838 u[IU]/mL (ref 0.450–4.500)

## 2012-08-31 ENCOUNTER — Other Ambulatory Visit: Payer: Self-pay | Admitting: Geriatric Medicine

## 2012-09-13 ENCOUNTER — Other Ambulatory Visit: Payer: Self-pay | Admitting: Internal Medicine

## 2012-09-23 ENCOUNTER — Telehealth: Payer: Self-pay

## 2012-09-23 NOTE — Telephone Encounter (Signed)
Patient called in again to get a status update on her message. Patient informed once message reviewed by  physician I will follow-up with her

## 2012-09-23 NOTE — Telephone Encounter (Signed)
Reviewed over the phone with CMA Jasmine December

## 2012-09-23 NOTE — Telephone Encounter (Signed)
Patient called in to say that Tramadol is not working for her and is causing her hair to thin. Patient looked up side effects to Tramadol and seen where hair thinning is a side effect. Patient would like something else sent to [facility] @ [address].  Please advise

## 2012-09-23 NOTE — Telephone Encounter (Signed)
Per Dr.Pandey, patient's hair thinning is probably related to thyroid concerns vs Tramadol. Tramadol can be increased to 1 by mouth every 6 hours as needed.  Spoke with patient, patient states she is not taking Tramadol, patient indicates this medication does nothing for her. Patient with pending appointment next week and will further discuss at that time.

## 2012-10-04 ENCOUNTER — Other Ambulatory Visit: Payer: Self-pay | Admitting: Internal Medicine

## 2012-10-06 ENCOUNTER — Other Ambulatory Visit: Payer: Self-pay | Admitting: Internal Medicine

## 2012-10-18 ENCOUNTER — Other Ambulatory Visit: Payer: Self-pay | Admitting: *Deleted

## 2012-10-18 MED ORDER — TRAMADOL HCL 50 MG PO TABS: ORAL_TABLET | ORAL | Status: DC

## 2012-10-27 ENCOUNTER — Encounter (HOSPITAL_COMMUNITY): Payer: Self-pay | Admitting: Emergency Medicine

## 2012-10-27 ENCOUNTER — Emergency Department (HOSPITAL_COMMUNITY)
Admission: EM | Admit: 2012-10-27 | Discharge: 2012-10-27 | Disposition: A | Discharge status: Home / Self Care | Payer: BC Managed Care – PPO | Attending: Emergency Medicine | Admitting: Emergency Medicine

## 2012-10-27 DIAGNOSIS — F319 Bipolar disorder, unspecified: Secondary | ICD-10-CM | POA: Insufficient documentation

## 2012-10-27 DIAGNOSIS — E039 Hypothyroidism, unspecified: Secondary | ICD-10-CM | POA: Insufficient documentation

## 2012-10-27 DIAGNOSIS — F17200 Nicotine dependence, unspecified, uncomplicated: Secondary | ICD-10-CM | POA: Insufficient documentation

## 2012-10-27 DIAGNOSIS — H2011 Chronic iridocyclitis, right eye: Secondary | ICD-10-CM

## 2012-10-27 DIAGNOSIS — E119 Type 2 diabetes mellitus without complications: Secondary | ICD-10-CM | POA: Insufficient documentation

## 2012-10-27 DIAGNOSIS — Z79899 Other long term (current) drug therapy: Secondary | ICD-10-CM | POA: Insufficient documentation

## 2012-10-27 DIAGNOSIS — Z8659 Personal history of other mental and behavioral disorders: Secondary | ICD-10-CM | POA: Insufficient documentation

## 2012-10-27 DIAGNOSIS — Z872 Personal history of diseases of the skin and subcutaneous tissue: Secondary | ICD-10-CM | POA: Insufficient documentation

## 2012-10-27 DIAGNOSIS — F411 Generalized anxiety disorder: Secondary | ICD-10-CM | POA: Insufficient documentation

## 2012-10-27 DIAGNOSIS — I10 Essential (primary) hypertension: Secondary | ICD-10-CM | POA: Insufficient documentation

## 2012-10-27 DIAGNOSIS — E7800 Pure hypercholesterolemia, unspecified: Secondary | ICD-10-CM | POA: Insufficient documentation

## 2012-10-27 DIAGNOSIS — H2010 Chronic iridocyclitis, unspecified eye: Secondary | ICD-10-CM | POA: Insufficient documentation

## 2012-10-27 MED ORDER — OXYCODONE-ACETAMINOPHEN 5-325 MG PO TABS: 1.0000 | ORAL_TABLET | Freq: Four times a day (QID) | ORAL | Status: DC | PRN

## 2012-10-27 MED ORDER — CYCLOPENTOLATE HCL 1 % OP SOLN: 1.0000 [drp] | Freq: Four times a day (QID) | OPHTHALMIC | Status: DC

## 2012-10-27 MED ORDER — OXYCODONE-ACETAMINOPHEN 5-325 MG PO TABS
2.0000 | ORAL_TABLET | Freq: Once | ORAL | Status: AC
  Administered 2012-10-27: 2 via ORAL
  Filled 2012-10-27: qty 2

## 2012-10-27 MED ORDER — ONDANSETRON 4 MG PO TBDP
4.0000 mg | ORAL_TABLET | Freq: Once | ORAL | Status: AC
  Administered 2012-10-27: 4 mg via ORAL
  Filled 2012-10-27: qty 1

## 2012-10-27 MED ORDER — CYCLOPENTOLATE HCL 1 % OP SOLN
1.0000 [drp] | Freq: Once | OPHTHALMIC | Status: AC
  Administered 2012-10-27: 1 [drp] via OPHTHALMIC
  Filled 2012-10-27: qty 2

## 2012-10-27 MED ORDER — TETRACAINE HCL 0.5 % OP SOLN
1.0000 [drp] | Freq: Once | OPHTHALMIC | Status: AC
  Administered 2012-10-27: 1 [drp] via OPHTHALMIC
  Filled 2012-10-27: qty 2

## 2012-10-27 MED ORDER — MORPHINE SULFATE 4 MG/ML IJ SOLN
4.0000 mg | Freq: Once | INTRAMUSCULAR | Status: AC
  Administered 2012-10-27: 4 mg via INTRAMUSCULAR
  Filled 2012-10-27: qty 1

## 2012-10-27 NOTE — ED Provider Notes (Signed)
CSN: [id number]     Arrival date & time 10/27/12  1929 History   First MD Initiated Contact with Patient 10/27/12 2033     Chief Complaint  Patient presents with  . Eye Pain   (Consider location/radiation/quality/duration/timing/severity/associated sxs/prior Treatment) HPI  Patient presents to the ER with complaints right eye pain. She has chronic eye problems in this eye for which she see's Dr. Luciana Axe for. She says that last night and today her eye pain became considerably worse. It is normally red, tearing, mild blurring. She says that this pain however is much more severe and different then her normal pain, she has had no nausea, vomiting, diarrhea, fatigue, headache, or fevers. She had an eye procedure done in 10/03/2012.   Past Medical History  Diagnosis Date  . Thyroid disease   . Hypercholesteremia   . Anxiety   . Lumbago   . Hypertension   . Enlargement of lymph nodes   . Benign neoplasm of lymph nodes   . Other atopic dermatitis and related conditions   . Other abnormal blood chemistry   . Unspecified schizophrenia, unspecified condition   . Bipolar I disorder, most recent episode (or current) unspecified   . Depression   . Myalgia and myositis, unspecified   . Hypothyroidism   . Diabetes mellitus type 2, controlled    Past Surgical History  Procedure Laterality Date  . Abdominal hysterectomy    . Thyroidectomy    . Appendectomy    . Eye surgery Right     september   No family history on file. History  Substance Use Topics  . Smoking status: Current Every Day Smoker    Types: Cigarettes  . Smokeless tobacco: Not on file  . Alcohol Use: No   OB History   Grav Para Term Preterm Abortions TAB SAB Ect Mult Living                 Review of Systems The patient denies anorexia, fever, weight loss,, vision loss, decreased hearing, hoarseness, chest pain, syncope, dyspnea on exertion, peripheral edema, balance deficits, hemoptysis, abdominal pain, melena,  hematochezia, severe indigestion/heartburn, hematuria, incontinence, genital sores, muscle weakness, suspicious skin lesions, transient blindness, difficulty walking, depression, unusual weight change, abnormal bleeding, enlarged lymph nodes, angioedema, and breast masses.  Allergies  Citalopram  Home Medications   Current Outpatient Rx  Name  Route  Sig  Dispense  Refill  . ALPRAZolam (XANAX) 1 MG tablet      TAKE 1 TABLET BY MOUTH 3 TIMES A DAY AS NEEDED FOR ANXIETY   90 tablet   0     NEVER RECIEVED FAX ON 9/15   . amLODipine (NORVASC) 5 MG tablet   Oral   Take 1 tablet (5 mg total) by mouth daily.   30 tablet   3   . levothyroxine (SYNTHROID, LEVOTHROID) 200 MCG tablet   Oral   Take 1 tablet (200 mcg total) by mouth daily.   30 tablet   3   . pravastatin (PRAVACHOL) 80 MG tablet   Oral   Take 1 tablet (80 mg total) by mouth daily.   30 tablet   3   . QUEtiapine (SEROQUEL) 50 MG tablet   Oral   Take 1 tablet (50 mg total) by mouth at bedtime.   30 tablet   3   . traMADol (ULTRAM) 50 MG tablet      TAKE 1 TABLET BY MOUTH EVERY 8 HOURS AS NEEDED FOR PAIN   90  tablet   0   . triamcinolone cream (KENALOG) 0.5 %   Topical   Apply 1 application topically 2 (two) times daily as needed. For eczema          BP 137/85  Pulse 84  Temp(Src) 98.4 F (36.9 C) (Oral)  Resp 20  Ht 5\' 4"  (1.626 m)  Wt 176 lb (79.833 kg)  BMI 30.2 kg/m2  SpO2 99% Physical Exam  Nursing note and vitals reviewed. Constitutional: She appears well-developed and well-nourished. No distress.  HENT:  Head: Normocephalic and atraumatic.  Eyes: Pupils are equal, round, and reactive to light. Lids are everted and swept, no foreign bodies found. Right eye exhibits discharge (clear watery discharge). Right conjunctiva is injected. Right eye exhibits normal extraocular motion.  Neck: Normal range of motion. Neck supple.  Cardiovascular: Normal rate and regular rhythm.   Pulmonary/Chest:  Effort normal.  Abdominal: Soft.  Neurological: She is alert.  Skin: Skin is warm and dry.    ED Course  Procedures (including critical care time) Labs Review Labs Reviewed - No data to display Imaging Review No results found.  MDM  No diagnosis found. Dx: recurrent Uveitis   Gave Tetracaine drops for pain and they did not help the patient.  Pt given 2 po Percocet and 4mg  IM morphine and pain finally got tolerable.  Her eye pressures were 16 in the right eye, 14 in the left eye.  I spoke with the patients Ophthalmologist Dr. Luciana Axe. The patient has recurrent Uveitits and was seen at [facility] two weeks ago for this same issue she is having today. I spoke with the patient and she confirmed that she DID go to this appointment and that it went well. Patient will be given CycloGyro 1% QID for pain. She can follow-up with Dr. Luciana Axe tomorrow.  52 y.o.Brittan Butterbaugh Crispen's evaluation in the Emergency Department is complete. It has been determined that no acute conditions requiring further emergency intervention are present at this time. The patient/guardian have been advised of the diagnosis and plan. We have discussed signs and symptoms that warrant return to the ED, such as changes or worsening in symptoms.  Vital signs are stable at discharge. Filed Vitals:   10/27/12 1952  BP: 137/85  Pulse: 84  Temp: 98.4 F (36.9 C)  Resp: 20    Patient/guardian has voiced understanding and agreed to follow-up with the PCP or specialist.     Dorthula Matas, PA-C 10/27/12 2315

## 2012-10-27 NOTE — ED Notes (Signed)
Bed: WA23 Expected date:  Expected time:  Means of arrival:  Comments: Triage 3  

## 2012-10-27 NOTE — ED Notes (Addendum)
Pt c/o R eye pain onset 1300, excessive tearing. Eye appears red. +photophobia. Pt did have laser surgery on that eye 9/14.A & O.

## 2012-10-28 NOTE — ED Provider Notes (Signed)
Medical screening examination/treatment/procedure(s) were conducted as a shared visit with non-physician practitioner(s) and myself.  I personally evaluated the patient during the encounter  I interviewed and examined the patient. Lungs are CTAB. Cardiac exam wnl. Abdomen soft. Will check pressures, discuss w/ optho.    Junius Argyle, MD 10/28/12 [phone]

## 2012-11-03 ENCOUNTER — Emergency Department (HOSPITAL_COMMUNITY): Payer: BC Managed Care – PPO

## 2012-11-03 ENCOUNTER — Encounter (HOSPITAL_COMMUNITY): Payer: Self-pay | Admitting: Emergency Medicine

## 2012-11-03 ENCOUNTER — Emergency Department (HOSPITAL_COMMUNITY)
Admission: EM | Admit: 2012-11-03 | Discharge: 2012-11-03 | Disposition: A | Discharge status: Home / Self Care | Payer: BC Managed Care – PPO | Attending: Emergency Medicine | Admitting: Emergency Medicine

## 2012-11-03 DIAGNOSIS — H2011 Chronic iridocyclitis, right eye: Secondary | ICD-10-CM

## 2012-11-03 DIAGNOSIS — H2010 Chronic iridocyclitis, unspecified eye: Secondary | ICD-10-CM | POA: Insufficient documentation

## 2012-11-03 DIAGNOSIS — F17200 Nicotine dependence, unspecified, uncomplicated: Secondary | ICD-10-CM | POA: Insufficient documentation

## 2012-11-03 DIAGNOSIS — Z872 Personal history of diseases of the skin and subcutaneous tissue: Secondary | ICD-10-CM | POA: Insufficient documentation

## 2012-11-03 DIAGNOSIS — E7800 Pure hypercholesterolemia, unspecified: Secondary | ICD-10-CM | POA: Insufficient documentation

## 2012-11-03 DIAGNOSIS — F411 Generalized anxiety disorder: Secondary | ICD-10-CM | POA: Insufficient documentation

## 2012-11-03 DIAGNOSIS — F319 Bipolar disorder, unspecified: Secondary | ICD-10-CM | POA: Insufficient documentation

## 2012-11-03 DIAGNOSIS — I10 Essential (primary) hypertension: Secondary | ICD-10-CM | POA: Insufficient documentation

## 2012-11-03 DIAGNOSIS — R209 Unspecified disturbances of skin sensation: Secondary | ICD-10-CM | POA: Insufficient documentation

## 2012-11-03 DIAGNOSIS — E039 Hypothyroidism, unspecified: Secondary | ICD-10-CM | POA: Insufficient documentation

## 2012-11-03 DIAGNOSIS — E119 Type 2 diabetes mellitus without complications: Secondary | ICD-10-CM | POA: Insufficient documentation

## 2012-11-03 DIAGNOSIS — H05019 Cellulitis of unspecified orbit: Secondary | ICD-10-CM | POA: Insufficient documentation

## 2012-11-03 DIAGNOSIS — F209 Schizophrenia, unspecified: Secondary | ICD-10-CM | POA: Insufficient documentation

## 2012-11-03 DIAGNOSIS — Z79899 Other long term (current) drug therapy: Secondary | ICD-10-CM | POA: Insufficient documentation

## 2012-11-03 DIAGNOSIS — L03213 Periorbital cellulitis: Secondary | ICD-10-CM

## 2012-11-03 LAB — CBC WITH DIFFERENTIAL/PLATELET
Eosinophils Absolute: 0.1 10*3/uL (ref 0.0–0.7)
Eosinophils Relative: 1 % (ref 0–5)
HCT: 38.2 % (ref 36.0–46.0)
Hemoglobin: 12.9 g/dL (ref 12.0–15.0)
Lymphocytes Relative: 23 % (ref 12–46)
Lymphs Abs: 1.8 10*3/uL (ref 0.7–4.0)
MCH: 28.1 pg (ref 26.0–34.0)
MCV: 83.2 fL (ref 78.0–100.0)
Monocytes Absolute: 0.9 10*3/uL (ref 0.1–1.0)
Monocytes Relative: 12 % (ref 3–12)
Neutrophils Relative %: 64 % (ref 43–77)
Platelets: 362 10*3/uL (ref 150–400)
RBC: 4.59 MIL/uL (ref 3.87–5.11)
WBC: 7.7 10*3/uL (ref 4.0–10.5)

## 2012-11-03 LAB — BASIC METABOLIC PANEL
BUN: 7 mg/dL (ref 6–23)
CO2: 26 mEq/L (ref 19–32)
Chloride: 101 mEq/L (ref 96–112)
Glucose, Bld: 93 mg/dL (ref 70–99)
Potassium: 3.6 mEq/L (ref 3.5–5.1)
Sodium: 137 mEq/L (ref 135–145)

## 2012-11-03 MED ORDER — HYDROMORPHONE HCL PF 1 MG/ML IJ SOLN
1.0000 mg | Freq: Once | INTRAMUSCULAR | Status: AC
  Administered 2012-11-03: 1 mg via INTRAVENOUS
  Filled 2012-11-03: qty 1

## 2012-11-03 MED ORDER — HYDROMORPHONE HCL PF 2 MG/ML IJ SOLN
2.0000 mg | Freq: Once | INTRAMUSCULAR | Status: AC
  Administered 2012-11-03: 2 mg via INTRAVENOUS
  Filled 2012-11-03: qty 1

## 2012-11-03 MED ORDER — AMOXICILLIN-POT CLAVULANATE 875-125 MG PO TABS: 1.0000 | ORAL_TABLET | Freq: Two times a day (BID) | ORAL | Status: DC

## 2012-11-03 MED ORDER — OXYCODONE-ACETAMINOPHEN 5-325 MG PO TABS: 1.0000 | ORAL_TABLET | Freq: Four times a day (QID) | ORAL | Status: DC | PRN

## 2012-11-03 MED ORDER — TETRACAINE HCL 0.5 % OP SOLN
2.0000 [drp] | Freq: Once | OPHTHALMIC | Status: AC
  Administered 2012-11-03: 2 [drp] via OPHTHALMIC
  Filled 2012-11-03: qty 2

## 2012-11-03 MED ORDER — FLUORESCEIN SODIUM 1 MG OP STRP
1.0000 | ORAL_STRIP | Freq: Once | OPHTHALMIC | Status: AC
  Administered 2012-11-03: 1 via OPHTHALMIC
  Filled 2012-11-03: qty 1

## 2012-11-03 MED ORDER — IOHEXOL 300 MG/ML  SOLN
100.0000 mL | Freq: Once | INTRAMUSCULAR | Status: AC | PRN
  Administered 2012-11-03: 100 mL via INTRAVENOUS

## 2012-11-03 MED ORDER — AMOXICILLIN-POT CLAVULANATE 875-125 MG PO TABS
1.0000 | ORAL_TABLET | Freq: Once | ORAL | Status: AC
  Administered 2012-11-03: 1 via ORAL
  Filled 2012-11-03: qty 1

## 2012-11-03 NOTE — ED Provider Notes (Signed)
MSE was initiated and I personally evaluated the patient and placed orders (if any) at  07:10 PM on November 03, 2012.  Toni Moses is a 52 y.o. female who presents to the Emergency Department complaining of ongoing, worsening right eye pain with associated redness onset 7 days. Pt reports being seen 10/27/2012 and was provided medication with no relief. Pt had laser eye surgery on October 15, 2012 done at [facility]. Pt states she woke up this morning with pain on her right side of her face, swelling of the eye, and redness . Pt states her vision is blurred in the right eye but can see normal in the left.  Pt denies pain with movement of the eye. Pt denies fever. Laser corrective surgery at [facility] in Sept. Ophtho is Dr. Luciana Axe, has appointment on Monday, has not seen him since last ED visit.  Pt tearful, in severe pain, Selling and TTP of right eyelid and zygoma. Left eye is injected, cornea is hazy. PERRL. EOMI intact, LSCTA bilaterally, Heart RRR with no MRG, abd exam benign.   Pt refuses visual acuity , will transfer her to main ed for CT of orbits to r/o orbital cellulitis. Discussed with PA Laveda Norman.  The patient appears stable so that the remainder of the MSE may be completed by another provider.  Wynetta Emery, PA-C 11/03/12 2014  Wynetta Emery, PA-C 11/03/12 2016

## 2012-11-03 NOTE — ED Provider Notes (Signed)
Medical screening examination/treatment/procedure(s) were performed by non-physician practitioner and as supervising physician I was immediately available for consultation/collaboration.   Gwyneth Sprout, MD 11/03/12 [phone]

## 2012-11-03 NOTE — ED Provider Notes (Signed)
CSN: [id number]     Arrival date & time 11/03/12  1706 History   First MD Initiated Contact with Patient 11/03/12 1802     Chief Complaint  Patient presents with  . Eye Pain   (Consider location/radiation/quality/duration/timing/severity/associated sxs/prior Treatment) HPI 52 year old patient presents today with right eye pain, loss of vision, and swelling. These symptoms have been intermittent x 1 year, and is related to chronic uveitis. She was recently seen in the ED for this right eye pain, and she was prescribed cyclodryl and percocet, and instructed to follow up with her eye doctor, Dr. Luciana Axe. She was not able to get an appointment until Monday, and her pain has worsened. The pain is sharp in nature, begins in her right eye and radiates into the right side of her head, down her neck, and once radiated down her right arm with tingling in her fingers. The pain is associated with photosensitivity. Eye movement does not worsen the pain. She woke up this morning and her eye was swollen. Her eye has also been red ever since this pain started. She states that she can barely see out of her right eye which is worse than normal. She has been having watering of her eyes but no other discharge. She has a history of chronic uveitis, for which she states she received steroid injections in that eye which improves her vision, which is poor at baseline. She has had a recent surgery on her right eye in September. She denies fever, nausea,vomiting, diarrhea, shortness of breath, and chest pain.  Past Medical History  Diagnosis Date  . Thyroid disease   . Hypercholesteremia   . Anxiety   . Lumbago   . Hypertension   . Enlargement of lymph nodes   . Benign neoplasm of lymph nodes   . Other atopic dermatitis and related conditions   . Other abnormal blood chemistry   . Unspecified schizophrenia, unspecified condition   . Bipolar I disorder, most recent episode (or current) unspecified   . Depression   .  Myalgia and myositis, unspecified   . Hypothyroidism   . Diabetes mellitus type 2, controlled    Past Surgical History  Procedure Laterality Date  . Abdominal hysterectomy    . Thyroidectomy    . Appendectomy    . Eye surgery Right     september   No family history on file. History  Substance Use Topics  . Smoking status: Current Every Day Smoker    Types: Cigarettes  . Smokeless tobacco: Not on file  . Alcohol Use: No   OB History   Grav Para Term Preterm Abortions TAB SAB Ect Mult Living                 Review of Systems  Constitutional: Negative for fever.  Eyes: Positive for photophobia, pain, redness and visual disturbance. Negative for discharge.  Neurological: Positive for headaches. Negative for numbness.    Allergies  Citalopram  Home Medications   Current Outpatient Rx  Name  Route  Sig  Dispense  Refill  . ALPRAZolam (XANAX) 1 MG tablet      TAKE 1 TABLET BY MOUTH 3 TIMES A DAY AS NEEDED FOR ANXIETY   90 tablet   0     NEVER RECIEVED FAX ON 9/15   . amLODipine (NORVASC) 5 MG tablet   Oral   Take 1 tablet (5 mg total) by mouth daily.   30 tablet   3   . cyclopentolate (  CYCLODRYL,CYCLOGYL) 1 % ophthalmic solution   Right Eye   Place 1 drop into the right eye 4 (four) times daily.   2 mL   0   . levothyroxine (SYNTHROID, LEVOTHROID) 200 MCG tablet   Oral   Take 1 tablet (200 mcg total) by mouth daily.   30 tablet   3   . oxyCODONE-acetaminophen (PERCOCET/ROXICET) 5-325 MG per tablet   Oral   Take 1 tablet by mouth every 6 (six) hours as needed for pain.   10 tablet   0   . pravastatin (PRAVACHOL) 80 MG tablet   Oral   Take 1 tablet (80 mg total) by mouth daily.   30 tablet   3   . QUEtiapine (SEROQUEL) 50 MG tablet   Oral   Take 1 tablet (50 mg total) by mouth at bedtime.   30 tablet   3   . traMADol (ULTRAM) 50 MG tablet      TAKE 1 TABLET BY MOUTH EVERY 8 HOURS AS NEEDED FOR PAIN   90 tablet   0   . triamcinolone cream  (KENALOG) 0.5 %   Topical   Apply 1 application topically 2 (two) times daily as needed. For eczema          BP 149/76  Pulse 109  Temp(Src) 99.2 F (37.3 C) (Oral)  Resp 20  SpO2 99% Physical Exam  Nursing note and vitals reviewed. Constitutional: She is oriented to person, place, and time. She appears well-developed and well-nourished. No distress.  HENT:  Head: Normocephalic and atraumatic.  Eyes: EOM are normal. Pupils are equal, round, and reactive to light. Lids are everted and swept, no foreign bodies found. Right eye exhibits chemosis. Right eye exhibits no discharge, no exudate and no hordeolum. No foreign body present in the right eye. Right conjunctiva is injected. Right conjunctiva has a hemorrhage. No scleral icterus. Right eye exhibits no nystagmus. Left eye exhibits no nystagmus.  Fundoscopic exam:      The right eye shows red reflex.  Slit lamp exam:      The right eye shows hypopyon. The right eye shows no corneal abrasion, no corneal flare, no corneal ulcer, no foreign body, no hyphema and no fluorescein uptake.  Mild preseptal cellulitis noted to R eye with proptosis to upper eyelid and mild upper lid edema.  R eye with PERRL and EOMI.  Improves pain with tetacaine drops.  Hypopyon noted on exam with significant injection to R eye.   Visual acuity 20/800 R eye, 20/200 with left eye without her prescription glasses.    Neck: Neck supple.  Lymphadenopathy:    She has no cervical adenopathy.  Neurological: She is alert and oriented to person, place, and time.  Skin: Skin is warm. Rash (R preseptal cellulitis noted on exam, ttp.  ) noted.    ED Course  Procedures (including critical care time)  10:17 PM Pt here with worsen eye pain in the setting of recent uveitis currently manage by Dr. Luciana Axe.  Her pain improves after receiving pain medication here.  Pt felt much better.  She has steroid drops at home.  I have consulted and spoke with opthalmologist Dr. Vonna Kotyk,  who recommend augmentin and to have close f/u with him tomorrow.  Care discussed with attending.  Pt request to be discharge, aware to return if worsen.    Labs Review Labs Reviewed  CBC WITH DIFFERENTIAL - Abnormal; Notable for the following:    RDW 15.9 (*)  All other components within normal limits  BASIC METABOLIC PANEL   Imaging Review Ct Orbits W/cm  11/03/2012   CLINICAL DATA:  Worsening pain and redness right eye.  EXAM: CT ORBITS WITH CONTRAST  TECHNIQUE: Multidetector CT imaging of the orbits was performed following the bolus administration of intravenous contrast.  CONTRAST:  OMNIPAQUE IOHEXOL 300 MG/ML  SOLN  COMPARISON:  None.  FINDINGS: The right globe is slightly proptotic. There is right preseptal periorbital soft tissue swelling and enhancement. The right lacrimal gland is hyperenhancing and slightly larger as compared to the left. There is no right postseptal inflammation or intraconal stranding. Bony confines of the orbits are intact. No sinus disease. Visualized intracranial compartment unremarkable. Normally positioned lens bilaterally.  IMPRESSION: Preseptal periorbital soft tissue swelling and enhancement. Conjunctivitis with periorbital cellulitis is suspected. Hyperenhancement and slight enlargement right lacrimal gland suggesting spread of inflammation or infection to this adjacent structure. Slight right proptosis but no post septal or intraconal inflammation or abscess.  These results were called by telephone at the time of interpretation on 11/03/2012 at 8:56 PM to Dr. Laveda Norman who verbally acknowledged these results.   Electronically Signed   By: Davonna Belling M.D.   On: 11/03/2012 20:56    EKG Interpretation   None       MDM   1. Preseptal cellulitis of right eye   2. Chronic uveitis, right    BP 149/76  Pulse 109  Temp(Src) 99.2 F (37.3 C) (Oral)  Resp 20  SpO2 99%  I have reviewed nursing notes and vital signs. I personally reviewed the imaging  tests through PACS system  I reviewed available ER/hospitalization records thought the EMR     Fayrene Helper, PA-C 11/03/12 2240

## 2012-11-03 NOTE — ED Notes (Signed)
Pt refused visual acuity exam. States "I just had eye surgery. I can't see nothin'. I'm in pain."

## 2012-11-03 NOTE — ED Notes (Signed)
Pt ambulatory to exam room with steady gait. Pt arrives with dark sunglasses. Lights in room turned off per pt request. Pt arrives with family.

## 2012-11-03 NOTE — ED Notes (Signed)
Pt reports having right eye pain that started one year ago. Pt reports being seen on  10/27/12 and was provided an eye drop, which has not significantly reduced her pain. The right Silva Bandy has signs of irritation. Pt denies double vision. Pt is A/O x4 and in NAD, vital signs are stable.

## 2012-11-03 NOTE — ED Notes (Signed)
PA Pisciotta at bedside.  

## 2012-11-04 ENCOUNTER — Other Ambulatory Visit: Payer: Self-pay | Admitting: *Deleted

## 2012-11-04 MED ORDER — ALPRAZOLAM 1 MG PO TABS: ORAL_TABLET | ORAL | Status: DC

## 2012-11-04 MED ORDER — QUETIAPINE FUMARATE 50 MG PO TABS: 50.0000 mg | ORAL_TABLET | Freq: Every day | ORAL | Status: DC

## 2012-11-04 NOTE — ED Provider Notes (Signed)
Medical screening examination/treatment/procedure(s) were performed by non-physician practitioner and as supervising physician I was immediately available for consultation/collaboration.  Shon Baton, MD 11/04/12 [phone]

## 2012-11-22 ENCOUNTER — Other Ambulatory Visit: Payer: Self-pay | Admitting: *Deleted

## 2012-11-22 MED ORDER — TRAMADOL HCL 50 MG PO TABS: ORAL_TABLET | ORAL | Status: DC

## 2012-11-25 ENCOUNTER — Other Ambulatory Visit: Payer: Self-pay

## 2012-12-21 ENCOUNTER — Emergency Department (HOSPITAL_COMMUNITY)
Admission: EM | Admit: 2012-12-21 | Discharge: 2012-12-21 | Disposition: A | Discharge status: Home / Self Care | Payer: BC Managed Care – PPO | Attending: Emergency Medicine | Admitting: Emergency Medicine

## 2012-12-21 ENCOUNTER — Encounter (HOSPITAL_COMMUNITY): Payer: Self-pay | Admitting: Emergency Medicine

## 2012-12-21 ENCOUNTER — Emergency Department (HOSPITAL_COMMUNITY): Payer: BC Managed Care – PPO

## 2012-12-21 DIAGNOSIS — Z79899 Other long term (current) drug therapy: Secondary | ICD-10-CM | POA: Insufficient documentation

## 2012-12-21 DIAGNOSIS — Y9301 Activity, walking, marching and hiking: Secondary | ICD-10-CM | POA: Insufficient documentation

## 2012-12-21 DIAGNOSIS — I10 Essential (primary) hypertension: Secondary | ICD-10-CM | POA: Insufficient documentation

## 2012-12-21 DIAGNOSIS — F17200 Nicotine dependence, unspecified, uncomplicated: Secondary | ICD-10-CM | POA: Insufficient documentation

## 2012-12-21 DIAGNOSIS — E7800 Pure hypercholesterolemia, unspecified: Secondary | ICD-10-CM | POA: Insufficient documentation

## 2012-12-21 DIAGNOSIS — F209 Schizophrenia, unspecified: Secondary | ICD-10-CM | POA: Insufficient documentation

## 2012-12-21 DIAGNOSIS — R296 Repeated falls: Secondary | ICD-10-CM | POA: Insufficient documentation

## 2012-12-21 DIAGNOSIS — M25511 Pain in right shoulder: Secondary | ICD-10-CM

## 2012-12-21 DIAGNOSIS — S46909A Unspecified injury of unspecified muscle, fascia and tendon at shoulder and upper arm level, unspecified arm, initial encounter: Secondary | ICD-10-CM | POA: Insufficient documentation

## 2012-12-21 DIAGNOSIS — Y929 Unspecified place or not applicable: Secondary | ICD-10-CM | POA: Insufficient documentation

## 2012-12-21 DIAGNOSIS — F411 Generalized anxiety disorder: Secondary | ICD-10-CM | POA: Insufficient documentation

## 2012-12-21 DIAGNOSIS — E119 Type 2 diabetes mellitus without complications: Secondary | ICD-10-CM | POA: Insufficient documentation

## 2012-12-21 DIAGNOSIS — M25531 Pain in right wrist: Secondary | ICD-10-CM

## 2012-12-21 DIAGNOSIS — Z862 Personal history of diseases of the blood and blood-forming organs and certain disorders involving the immune mechanism: Secondary | ICD-10-CM | POA: Insufficient documentation

## 2012-12-21 DIAGNOSIS — IMO0002 Reserved for concepts with insufficient information to code with codable children: Secondary | ICD-10-CM | POA: Insufficient documentation

## 2012-12-21 DIAGNOSIS — E039 Hypothyroidism, unspecified: Secondary | ICD-10-CM | POA: Insufficient documentation

## 2012-12-21 DIAGNOSIS — H538 Other visual disturbances: Secondary | ICD-10-CM | POA: Insufficient documentation

## 2012-12-21 DIAGNOSIS — S6990XA Unspecified injury of unspecified wrist, hand and finger(s), initial encounter: Secondary | ICD-10-CM | POA: Insufficient documentation

## 2012-12-21 DIAGNOSIS — W19XXXA Unspecified fall, initial encounter: Secondary | ICD-10-CM

## 2012-12-21 DIAGNOSIS — S59909A Unspecified injury of unspecified elbow, initial encounter: Secondary | ICD-10-CM | POA: Insufficient documentation

## 2012-12-21 DIAGNOSIS — Z872 Personal history of diseases of the skin and subcutaneous tissue: Secondary | ICD-10-CM | POA: Insufficient documentation

## 2012-12-21 DIAGNOSIS — M549 Dorsalgia, unspecified: Secondary | ICD-10-CM

## 2012-12-21 DIAGNOSIS — F3130 Bipolar disorder, current episode depressed, mild or moderate severity, unspecified: Secondary | ICD-10-CM | POA: Insufficient documentation

## 2012-12-21 DIAGNOSIS — S4980XA Other specified injuries of shoulder and upper arm, unspecified arm, initial encounter: Secondary | ICD-10-CM | POA: Insufficient documentation

## 2012-12-21 MED ORDER — HYDROCODONE-ACETAMINOPHEN 5-325 MG PO TABS: 2.0000 | ORAL_TABLET | ORAL | Status: DC | PRN

## 2012-12-21 MED ORDER — HYDROCODONE-ACETAMINOPHEN 5-325 MG PO TABS
2.0000 | ORAL_TABLET | Freq: Once | ORAL | Status: AC
  Administered 2012-12-21: 2 via ORAL
  Filled 2012-12-21: qty 2

## 2012-12-21 MED ORDER — IBUPROFEN 200 MG PO TABS
400.0000 mg | ORAL_TABLET | Freq: Once | ORAL | Status: AC
  Administered 2012-12-21: 400 mg via ORAL
  Filled 2012-12-21: qty 2

## 2012-12-21 NOTE — ED Notes (Signed)
Pt reports loosing her balance last night and having a fall, tried to break her fall with right hand. Having pain to hand, wrist, arm, shoulder and back. Ambulatory at triage, no acute distress or obv injury noted.

## 2012-12-21 NOTE — ED Notes (Signed)
Md Zavitz at bedside  

## 2012-12-21 NOTE — ED Provider Notes (Signed)
CSN: [id number]     Arrival date & time 12/21/12  0919 History   First MD Initiated Contact with Patient 12/21/12 1047     Chief Complaint  Patient presents with  . Fall  . Hand Injury   (Consider location/radiation/quality/duration/timing/severity/associated sxs/prior Treatment) HPI Comments: 52 yo female with htn, dm, chol presents with right arm/ shoulder/ back pain since fall yesterday evening. Pt was walking and lost footing and fell.  She did not trip on anything but recalls details, no sxs prior to or after except pain at fall sites.  Pain with rom.  No ha, cp, sob or vomiting.  Pt feels well except fall injuries.    Patient is a 52 y.o. female presenting with fall and hand injury. The history is provided by the patient.  Fall This is a new problem. Pertinent negatives include no chest pain, no abdominal pain, no headaches and no shortness of breath.  Hand Injury Associated symptoms: back pain   Associated symptoms: no fever and no neck pain     Past Medical History  Diagnosis Date  . Thyroid disease   . Hypercholesteremia   . Anxiety   . Lumbago   . Hypertension   . Enlargement of lymph nodes   . Benign neoplasm of lymph nodes   . Other atopic dermatitis and related conditions   . Other abnormal blood chemistry   . Unspecified schizophrenia, unspecified condition   . Bipolar I disorder, most recent episode (or current) unspecified   . Depression   . Myalgia and myositis, unspecified   . Hypothyroidism   . Diabetes mellitus type 2, controlled    Past Surgical History  Procedure Laterality Date  . Abdominal hysterectomy    . Thyroidectomy    . Appendectomy    . Eye surgery Right     september   History reviewed. No pertinent family history. History  Substance Use Topics  . Smoking status: Current Every Day Smoker    Types: Cigarettes  . Smokeless tobacco: Not on file  . Alcohol Use: No   OB History   Grav Para Term Preterm Abortions TAB SAB Ect Mult Living                  Review of Systems  Constitutional: Negative for fever and chills.  HENT: Negative for congestion.   Eyes: Positive for visual disturbance (chronic).  Respiratory: Negative for shortness of breath.   Cardiovascular: Negative for chest pain.  Gastrointestinal: Negative for vomiting and abdominal pain.  Genitourinary: Negative for dysuria and flank pain.  Musculoskeletal: Positive for arthralgias and back pain. Negative for neck pain and neck stiffness.  Skin: Negative for rash.  Neurological: Negative for light-headedness and headaches.    Allergies  Citalopram  Home Medications   Current Outpatient Rx  Name  Route  Sig  Dispense  Refill  . acetaminophen (TYLENOL) 500 MG tablet   Oral   Take 1,000 mg by mouth every 6 (six) hours as needed for moderate pain.         Marland Kitchen ALPRAZolam (XANAX) 1 MG tablet      Take one tablet by mouth three times daily as needed for anxiety   90 tablet   1     NEVER RECIEVED FAX ON 9/15   . amLODipine (NORVASC) 5 MG tablet   Oral   Take 1 tablet (5 mg total) by mouth daily.   30 tablet   3   . levothyroxine (SYNTHROID, LEVOTHROID) 200 MCG  tablet   Oral   Take 1 tablet (200 mcg total) by mouth daily.   30 tablet   3   . pravastatin (PRAVACHOL) 80 MG tablet   Oral   Take 1 tablet (80 mg total) by mouth daily.   30 tablet   3   . QUEtiapine (SEROQUEL) 50 MG tablet   Oral   Take 1 tablet (50 mg total) by mouth at bedtime.   30 tablet   3   . traMADol (ULTRAM) 50 MG tablet      TAKE 1 TABLET BY MOUTH EVERY 8 HOURS AS NEEDED FOR PAIN   90 tablet   0   . triamcinolone cream (KENALOG) 0.5 %   Topical   Apply 1 application topically 2 (two) times daily as needed. For eczema          BP 159/110  Pulse 88  Temp(Src) 97.8 F (36.6 C) (Oral)  Resp 22  Ht 5\' 5"  (1.651 m)  Wt 175 lb 9.6 oz (79.652 kg)  BMI 29.22 kg/m2  SpO2 94% Physical Exam  Nursing note and vitals reviewed. Constitutional: She is oriented  to person, place, and time. She appears well-developed and well-nourished.  HENT:  Head: Normocephalic and atraumatic.  Eyes: Conjunctivae are normal. Right eye exhibits no discharge. Left eye exhibits no discharge.  Neck: Normal range of motion. Neck supple. No tracheal deviation present.  Cardiovascular: Normal rate and regular rhythm.   Pulmonary/Chest: Effort normal and breath sounds normal.  Abdominal: Soft. She exhibits no distension. There is no tenderness. There is no guarding.  Musculoskeletal: She exhibits tenderness. She exhibits no edema.  Tender right lateral shoulder, distal radius and upper thoracic, worse with rom, nv intact distal  Neurological: She is alert and oriented to person, place, and time.  decr vision chronic Left pupil larger than right- normal per pt No droop or drift Equal strength and sensation bilateral UE and LE  Skin: Skin is warm. No rash noted.  Psychiatric: She has a normal mood and affect.    ED Course  Procedures (including critical care time) Labs Review Labs Reviewed - No data to display Imaging Review Dg Thoracic Spine 4v  12/21/2012   CLINICAL DATA:  Pain post trauma  EXAM: THORACIC SPINE - 3 VIEW  COMPARISON:  Chest radiograph November 18, 2011  FINDINGS: Frontal, lateral, and swimmer's views were obtained. There is no fracture or spondylolisthesis. There is slight disc space narrowing at several levels. No erosive change.  IMPRESSION: Slight osteoarthritic change.  No fracture or spondylolisthesis.   Electronically Signed   By: Bretta Bang M.D.   On: 12/21/2012 13:10   Dg Shoulder Right  12/21/2012   CLINICAL DATA:  Fall, shoulder pain  EXAM: RIGHT SHOULDER - 2+ VIEW  COMPARISON:  None.  FINDINGS: There is no evidence of fracture or dislocation. There is no evidence of arthropathy or other focal bone abnormality. Soft tissues are unremarkable.  IMPRESSION: Negative.   Electronically Signed   By: Natasha Mead M.D.   On: 12/21/2012 10:38    Dg Wrist Complete Right  12/21/2012   CLINICAL DATA:  Fall, wrist pain  EXAM: RIGHT WRIST - COMPLETE 3+ VIEW  COMPARISON:  None.  FINDINGS: There is no evidence of fracture or dislocation. There is no evidence of arthropathy or other focal bone abnormality. Soft tissues are unremarkable.  IMPRESSION: Negative.   Electronically Signed   By: Natasha Mead M.D.   On: 12/21/2012 10:38   Dg Hand  Complete Right  12/21/2012   CLINICAL DATA:  Fall, wrist pain  EXAM: RIGHT HAND - COMPLETE 3+ VIEW  COMPARISON:  None.  FINDINGS: Three views of the right hand submitted. No acute fracture or subluxation. No radiopaque foreign body.  IMPRESSION: Negative.   Electronically Signed   By: Natasha Mead M.D.   On: 12/21/2012 10:38    EKG Interpretation   None       MDM   1. Right shoulder pain   2. Right wrist pain   3. Back pain   4. Fall, initial encounter    Likely mechanical fall with chronic vision changes however discussed getting blood work and eval for Other possible causes, pt feels she just fell and does not want blood work at this time. Xrays done.  No acute fx.  Pain meds in ED.  Pt improved in ED Results and differential diagnosis were discussed with the patient. Close follow up outpatient was discussed, patient comfortable with the plan.   Diagnosis: Fall, Right shoulder pain      Enid Skeens, MD 12/22/12 [phone]

## 2012-12-21 NOTE — ED Notes (Signed)
Patient presents to ed c/o pain in her right arm, shoulder and back , states she was walking last pm and fell, states she didn't slip to step off anything to her knowledge, she just fell . Positive right radial pulse. Able to move arm. No numbness or tingling in arm. States her sight isn't very good this has been progressive.

## 2012-12-28 ENCOUNTER — Encounter: Payer: Self-pay | Admitting: Internal Medicine

## 2012-12-28 ENCOUNTER — Ambulatory Visit (INDEPENDENT_AMBULATORY_CARE_PROVIDER_SITE_OTHER): Payer: BC Managed Care – PPO | Admitting: Internal Medicine

## 2012-12-28 VITALS — BP 142/96 | HR 69 | Temp 100.7°F | Wt 174.2 lb

## 2012-12-28 DIAGNOSIS — E119 Type 2 diabetes mellitus without complications: Secondary | ICD-10-CM

## 2012-12-28 DIAGNOSIS — G8911 Acute pain due to trauma: Secondary | ICD-10-CM

## 2012-12-28 DIAGNOSIS — I10 Essential (primary) hypertension: Secondary | ICD-10-CM

## 2012-12-28 DIAGNOSIS — F32A Depression, unspecified: Secondary | ICD-10-CM

## 2012-12-28 DIAGNOSIS — E039 Hypothyroidism, unspecified: Secondary | ICD-10-CM

## 2012-12-28 DIAGNOSIS — M25519 Pain in unspecified shoulder: Secondary | ICD-10-CM

## 2012-12-28 DIAGNOSIS — M25511 Pain in right shoulder: Secondary | ICD-10-CM

## 2012-12-28 DIAGNOSIS — F329 Major depressive disorder, single episode, unspecified: Secondary | ICD-10-CM

## 2012-12-28 MED ORDER — PRAVASTATIN SODIUM 80 MG PO TABS: 80.0000 mg | ORAL_TABLET | Freq: Every day | ORAL | Status: DC

## 2012-12-28 MED ORDER — HYDROCODONE-ACETAMINOPHEN 5-325 MG PO TABS: 1.0000 | ORAL_TABLET | ORAL | Status: DC | PRN

## 2012-12-28 MED ORDER — AMLODIPINE BESYLATE 5 MG PO TABS: 5.0000 mg | ORAL_TABLET | Freq: Every day | ORAL | Status: DC

## 2012-12-28 MED ORDER — METHOCARBAMOL 500 MG PO TABS: 500.0000 mg | ORAL_TABLET | Freq: Three times a day (TID) | ORAL | Status: DC | PRN

## 2012-12-28 MED ORDER — LEVOTHYROXINE SODIUM 200 MCG PO TABS: 200.0000 ug | ORAL_TABLET | Freq: Every day | ORAL | Status: DC
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
## 2012-12-29 LAB — HEMOGLOBIN A1C
Est. average glucose Bld gHb Est-mCnc: 126 mg/dL
Hgb A1c MFr Bld: 6 % — ABNORMAL HIGH (ref 4.8–5.6)

## 2012-12-30 ENCOUNTER — Other Ambulatory Visit: Payer: Self-pay | Admitting: *Deleted

## 2012-12-30 ENCOUNTER — Telehealth: Payer: Self-pay | Admitting: *Deleted

## 2012-12-30 NOTE — Telephone Encounter (Signed)
Patient called regarding a refill on Xanax, called refill to [facility] on [address]. Patient refill was for 90 tablets with 1 refill. Called patient and left message that medication was called in to the pharmacy. SRice  RMA

## 2013-01-28 ENCOUNTER — Telehealth: Payer: Self-pay | Admitting: *Deleted

## 2013-01-28 MED ORDER — HYDROCODONE-ACETAMINOPHEN 5-325 MG PO TABS: 1.0000 | ORAL_TABLET | ORAL | Status: DC | PRN

## 2013-01-28 NOTE — Telephone Encounter (Signed)
Pt called regarding a refill on the Hydrocodone-Acet. rx reprinted due to not printed on rx paper

## 2013-02-01 ENCOUNTER — Encounter: Payer: Self-pay | Admitting: Internal Medicine

## 2013-02-01 ENCOUNTER — Ambulatory Visit (INDEPENDENT_AMBULATORY_CARE_PROVIDER_SITE_OTHER): Payer: Medicare PPO | Admitting: Internal Medicine

## 2013-02-01 VITALS — BP 122/72 | HR 96 | Temp 98.2°F | Wt 176.0 lb

## 2013-02-01 DIAGNOSIS — F32A Depression, unspecified: Secondary | ICD-10-CM

## 2013-02-01 DIAGNOSIS — R221 Localized swelling, mass and lump, neck: Secondary | ICD-10-CM

## 2013-02-01 DIAGNOSIS — I10 Essential (primary) hypertension: Secondary | ICD-10-CM

## 2013-02-01 DIAGNOSIS — F419 Anxiety disorder, unspecified: Secondary | ICD-10-CM

## 2013-02-01 DIAGNOSIS — E039 Hypothyroidism, unspecified: Secondary | ICD-10-CM

## 2013-02-01 DIAGNOSIS — G8911 Acute pain due to trauma: Secondary | ICD-10-CM

## 2013-02-01 DIAGNOSIS — F3289 Other specified depressive episodes: Secondary | ICD-10-CM

## 2013-02-01 DIAGNOSIS — F411 Generalized anxiety disorder: Secondary | ICD-10-CM

## 2013-02-01 DIAGNOSIS — E119 Type 2 diabetes mellitus without complications: Secondary | ICD-10-CM

## 2013-02-01 DIAGNOSIS — M25511 Pain in right shoulder: Secondary | ICD-10-CM

## 2013-02-01 DIAGNOSIS — R220 Localized swelling, mass and lump, head: Secondary | ICD-10-CM

## 2013-02-01 DIAGNOSIS — F329 Major depressive disorder, single episode, unspecified: Secondary | ICD-10-CM

## 2013-02-01 DIAGNOSIS — M25519 Pain in unspecified shoulder: Secondary | ICD-10-CM

## 2013-02-01 MED ORDER — TRIAMCINOLONE ACETONIDE 0.5 % EX CREA: 1.0000 "application " | TOPICAL_CREAM | Freq: Two times a day (BID) | CUTANEOUS | Status: DC | PRN

## 2013-02-01 MED ORDER — NAPROXEN 500 MG PO TABS: 500.0000 mg | ORAL_TABLET | Freq: Two times a day (BID) | ORAL | Status: DC

## 2013-02-01 NOTE — Progress Notes (Signed)
Patient ID: Toni Moses, female   DOB: 1960-09-22, 53 y.o.   MRN: [id number]     Chief Complaint  Patient presents with  . Medical Managment of Chronic Issues    1 month follow-up, ongoing right shoulder pain     HPI 53 y/o female patient is here for routine follow up visit. She continues to have pain in her right shoulder, unable to lay on that side. Pain limits ROM and movement makes it sore. Hydrocodone for pain has been helpful. Muscle relaxant is been helpful She is complaint with her thyroid medication bp stable this visit She is taking her seroquel 50 mg daily and her xanax. Missed her psychiatrist appointment with insurance issues Had missed her levothyroxine for few weeks. Her tsh was abnormal on recent blood work. She has started taking the levothyroxine for 2 weeks now  Review of Systems   Constitutional: Negative for fever, chills and diaphoresis.   HENT: Negative for congestion.    Respiratory: Negative for cough and shortness of breath.    Cardiovascular: Negative for chest pain, palpitations and leg swelling.   Gastrointestinal: Negative for heartburn, nausea, vomiting, abdominal pain and constipation.   Genitourinary: Negative for dysuria.   Musculoskeletal: Negative for falls.   Skin: Negative for rash.   Neurological: Negative for dizziness, seizures, weakness and headaches.   Psychiatric/Behavioral: Positive for depression. Negative for memory loss.    Past Medical History  Diagnosis Date  . Thyroid disease   . Hypercholesteremia   . Anxiety   . Lumbago   . Hypertension   . Enlargement of lymph nodes   . Benign neoplasm of lymph nodes   . Other atopic dermatitis and related conditions   . Other abnormal blood chemistry   . Unspecified schizophrenia, unspecified condition   . Bipolar I disorder, most recent episode (or current) unspecified   . Depression   . Myalgia and myositis, unspecified   . Hypothyroidism   . Diabetes mellitus type 2, controlled     Medication reviewed. See [facility]  Physical exam BP 122/72  Pulse 96  Temp(Src) 98.2 F (36.8 C) (Oral)  Wt 176 lb (79.833 kg)  SpO2 97%  Constitutional: She is oriented to person, place, and time. No distress. overweight  HENT:  Head: Normocephalic and atraumatic.   Mouth/Throat: Oropharynx is clear and moist.  Eyes: Pupils are equal, round, and reactive to light.  Neck: Normal range of motion. Neck supple. Mobile, non tender right supraclavicular LN  Cardiovascular: Normal rate, regular rhythm and normal heart sounds.    No murmur heard. Pulmonary/Chest: Effort normal and breath sounds normal. No respiratory distress. She has no wheezes.  Abdominal: Soft. Bowel sounds are normal. She exhibits no distension and no mass.  Musculoskeletal: tenderness around acromial process. Limited overhead abduction Neurological: She is alert and oriented to person, place, and time.  Skin: Skin is warm. She is not diaphoretic. No pallor. Eczema present  Psychiatric: Judgment and thought content normal. Mood better today  Labs Lab Results  Component Value Date   TSH 34.790* 12/28/2012   Assessment/plan  1. Acute pain of right shoulder due to trauma Possible bursitis. With persistent discomfort, will get shoulder xray to rule out acute bone abnormality. Will have her on naproxen 500 mg bid  And ice pack for now. Continue prn hydrocodone and robaxin - DG Shoulder Right; Future  2. Anxiety Stable. Continue current regimen of anxiolytics  3. Depression Continue seroquel for now  4. Hypothyroidism Med compliance reinforced along  with direction for use. Continue levothyroxine 200 mcg daily and recheck thyroid panel in 4 weeks. Hx of previous thyroidectomy.  - TSH; Future - T4, Free; Future - T3; Future  5. Hypertension Well controlled. Continue amlodipine  6. Diabetes mellitus type 2, controlled Stable. Off all meds. Reviewed recent a1c  7. Localized swelling, mass and lump,  neck Persistent right supraclavicular lymph node, non tender, possible slight increase in size. Will get another neck usg to assess for the lymph node and compare the size. Last usg 10/13  - US Soft Tissue Head/Neck; Future

## 2013-02-08 ENCOUNTER — Inpatient Hospital Stay: Admission: RE | Admit: 2013-02-08 | Payer: BC Managed Care – PPO | Source: Ambulatory Visit

## 2013-02-08 ENCOUNTER — Other Ambulatory Visit: Payer: BC Managed Care – PPO

## 2013-02-22 ENCOUNTER — Other Ambulatory Visit: Payer: Self-pay

## 2013-02-22 NOTE — Telephone Encounter (Signed)
Patient called requesting refill for Hydrocodone, patient informed rx will not be available until 02/28/2013 (next Monday), patient verbalized understanding. Triage assistant Rodena Piety) informed of patient's request. Per Rodena Piety rx will be printed Monday morning.

## 2013-02-23 ENCOUNTER — Other Ambulatory Visit: Payer: Self-pay

## 2013-02-23 MED ORDER — ALPRAZOLAM 1 MG PO TABS: ORAL_TABLET | ORAL | Status: DC

## 2013-02-28 ENCOUNTER — Other Ambulatory Visit: Payer: Self-pay | Admitting: *Deleted

## 2013-02-28 MED ORDER — HYDROCODONE-ACETAMINOPHEN 5-325 MG PO TABS: 1.0000 | ORAL_TABLET | ORAL | Status: DC | PRN

## 2013-03-17 ENCOUNTER — Other Ambulatory Visit: Payer: Medicare (Managed Care)

## 2013-03-18 ENCOUNTER — Encounter: Payer: Self-pay | Admitting: *Deleted

## 2013-03-22 ENCOUNTER — Ambulatory Visit: Payer: Medicare (Managed Care) | Admitting: Internal Medicine

## 2013-03-28 ENCOUNTER — Other Ambulatory Visit: Payer: Self-pay | Admitting: *Deleted

## 2013-03-28 MED ORDER — HYDROCODONE-ACETAMINOPHEN 5-325 MG PO TABS: 1.0000 | ORAL_TABLET | ORAL | Status: DC | PRN

## 2013-04-12 ENCOUNTER — Encounter: Payer: Self-pay | Admitting: Internal Medicine

## 2013-04-12 ENCOUNTER — Ambulatory Visit (INDEPENDENT_AMBULATORY_CARE_PROVIDER_SITE_OTHER): Payer: Medicare Other | Admitting: Internal Medicine

## 2013-04-12 VITALS — BP 150/92 | HR 99 | Temp 98.1°F | Wt 169.8 lb

## 2013-04-12 DIAGNOSIS — F419 Anxiety disorder, unspecified: Secondary | ICD-10-CM

## 2013-04-12 DIAGNOSIS — R221 Localized swelling, mass and lump, neck: Secondary | ICD-10-CM

## 2013-04-12 DIAGNOSIS — F411 Generalized anxiety disorder: Secondary | ICD-10-CM

## 2013-04-12 DIAGNOSIS — R220 Localized swelling, mass and lump, head: Secondary | ICD-10-CM

## 2013-04-12 DIAGNOSIS — E119 Type 2 diabetes mellitus without complications: Secondary | ICD-10-CM

## 2013-04-12 DIAGNOSIS — E039 Hypothyroidism, unspecified: Secondary | ICD-10-CM

## 2013-04-12 DIAGNOSIS — I10 Essential (primary) hypertension: Secondary | ICD-10-CM

## 2013-04-12 DIAGNOSIS — F329 Major depressive disorder, single episode, unspecified: Secondary | ICD-10-CM

## 2013-04-12 DIAGNOSIS — F3289 Other specified depressive episodes: Secondary | ICD-10-CM

## 2013-04-12 DIAGNOSIS — F32A Depression, unspecified: Secondary | ICD-10-CM

## 2013-04-12 DIAGNOSIS — M25511 Pain in right shoulder: Secondary | ICD-10-CM

## 2013-04-12 DIAGNOSIS — M25519 Pain in unspecified shoulder: Secondary | ICD-10-CM

## 2013-04-12 MED ORDER — AMLODIPINE BESYLATE 5 MG PO TABS: 5.0000 mg | ORAL_TABLET | Freq: Every day | ORAL | Status: DC

## 2013-04-12 NOTE — Progress Notes (Signed)
Patient ID: Toni Moses, female   DOB: [birth year], 53 y.o.   MRN: [id number]    Chief Complaint  Patient presents with  . Follow-up    depression, HTN, shoulder pain   Allergies  Allergen Reactions  . Citalopram Other (See Comments)    "made her feel bad"   HPI 53 y/o female patient is here for routine follow up visit. She continues to have pain in her right shoulder. she has not had her xray done due to insurance issue. She also has not had ultrasound of the neck tissue performed due to same reason. She is tearful this visit regarding the responsibility and care needed for her husband and it being overwhelming for her. She has stopped taking her amlodipine. Her bp is elevated this office visit. She is complaint with other medications.   Review of Systems   Constitutional: Negative for fever, chills and diaphoresis.   HENT: Negative for congestion.    Respiratory: Negative for cough and shortness of breath.    Cardiovascular: Negative for chest pain, palpitations and leg swelling.   Gastrointestinal: Negative for heartburn, nausea, vomiting, abdominal pain and constipation.   Genitourinary: Negative for dysuria.   Musculoskeletal: Negative for falls.   Skin: Negative for rash.   Neurological: Negative for dizziness, seizures, weakness and headaches.   Psychiatric/Behavioral: Positive for depression. Negative for memory loss.   Past Medical History  Diagnosis Date  . Thyroid disease   . Hypercholesteremia   . Anxiety   . Lumbago   . Hypertension   . Enlargement of lymph nodes   . Benign neoplasm of lymph nodes   . Other atopic dermatitis and related conditions   . Other abnormal blood chemistry   . Unspecified schizophrenia, unspecified condition   . Bipolar I disorder, most recent episode (or current) unspecified   . Depression   . Myalgia and myositis, unspecified   . Hypothyroidism   . Diabetes mellitus type 2, controlled    Current Outpatient Prescriptions on File  Prior to Visit  Medication Sig Dispense Refill  . acetaminophen (TYLENOL) 500 MG tablet Take 1,000 mg by mouth every 6 (six) hours as needed for moderate pain.      Marland Kitchen ALPRAZolam (XANAX) 1 MG tablet Take one tablet by mouth three times daily as needed for anxiety  90 tablet  1  . HYDROcodone-acetaminophen (NORCO) 5-325 MG per tablet Take 1 tablet by mouth every 4 (four) hours as needed for severe pain.  120 tablet  0  . levothyroxine (SYNTHROID, LEVOTHROID) 200 MCG tablet Take 1 tablet (200 mcg total) by mouth daily.  30 tablet  5  . methocarbamol (ROBAXIN) 500 MG tablet Take 1 tablet (500 mg total) by mouth every 8 (eight) hours as needed for muscle spasms.  30 tablet  0  . naproxen (NAPROSYN) 500 MG tablet Take 1 tablet (500 mg total) by mouth 2 (two) times daily with a meal.  20 tablet  0  . pravastatin (PRAVACHOL) 80 MG tablet Take 1 tablet (80 mg total) by mouth daily.  30 tablet  5  . QUEtiapine (SEROQUEL) 50 MG tablet Take 1 tablet (50 mg total) by mouth at bedtime.  30 tablet  3  . triamcinolone cream (KENALOG) 0.5 % Apply 1 application topically 2 (two) times daily as needed. For eczema  30 g  3   No current facility-administered medications on file prior to visit.   Past Surgical History  Procedure Laterality Date  . Abdominal hysterectomy    .  Thyroidectomy    . Appendectomy    . Eye surgery Right     september   History reviewed. No pertinent family history.  Physical exam BP 150/92  Pulse 99  Temp(Src) 98.1 F (36.7 C)  Wt 169 lb 12.8 oz (77.021 kg)  SpO2 99%  Constitutional: She is oriented to person, place, and time. No distress. overweight   HENT:  Head: Normocephalic and atraumatic.   Mouth/Throat: Oropharynx is clear and moist.   Eyes: Pupils are equal, round, and reactive to light.  Neck: Normal range of motion. Neck supple. Mobile, non tender right supraclavicular LN  . No axillary lymph nodes Cardiovascular: Normal rate, regular rhythm and normal heart  sounds.    No murmur heard. Pulmonary/Chest: Effort normal and breath sounds normal. No respiratory distress. She has no wheezes.   Abdominal: Soft. Bowel sounds are normal. She exhibits no distension and no mass.  Musculoskeletal: tenderness around acromial process. Limited overhead abduction Neurological: She is alert and oriented to person, place, and time.   Skin: Skin is warm. She is not diaphoretic. No pallor. Eczema present  Psychiatric: Judgment and thought content normal. Mood better today   Labs-  Lab Results  Component Value Date   TSH 34.790* 12/28/2012   CMP     Component Value Date/Time   NA 137 11/03/2012 1935   NA 140 08/25/2012 1152   K 3.6 11/03/2012 1935   CL 101 11/03/2012 1935   CO2 26 11/03/2012 1935   GLUCOSE 93 11/03/2012 1935   GLUCOSE 101* 08/25/2012 1152   BUN 7 11/03/2012 1935   BUN 10 08/25/2012 1152   CREATININE 0.79 11/03/2012 1935   CALCIUM 9.6 11/03/2012 1935   PROT 7.6 08/25/2012 1152   PROT 8.0 12/03/2011 1715   ALBUMIN 3.4* 12/03/2011 1715   AST 13 08/25/2012 1152   ALT 11 08/25/2012 1152   ALKPHOS 89 08/25/2012 1152   BILITOT 0.2 08/25/2012 1152   GFRNONAA >90 11/03/2012 1935   GFRAA >90 11/03/2012 1935   Lab Results  Component Value Date   HGBA1C 6.0* 12/28/2012   Assessment/plan  1. Hypertension Elevated bp reading. Pt advised to resume amlodipine 5 mg daily. Warning with elevated bp reading explained - CMP  2. Hypothyroidism Med compliance reinforced. Check tsh today. Continue levothyroxine 200 mcg daily  - TSH  3. Diabetes mellitus type 2, controlled Diet controlled. Off all medications - Hemoglobin A1c  4. Localized swelling, mass and lump, neck Pending neck ultrasound  5. Depression continue seroquel for now. Pt mentions this dose has helped her, does not want dose changed at present  6. Anxiety Stable. Continue current regimen of anxiolytics  7. Right shoulder pain Possible arthritis vs bursitis. Pending shoulder xray. ROM  limited with pain. Current regimen of oxycodone and naproxen with prn robaxin has been helpful as per pt. Continue this   Blanchie Serve, MD  [facility] [phone] (Monday-Friday 8 am - 5 pm) [phone] (afterhours)

## 2013-04-13 LAB — COMPREHENSIVE METABOLIC PANEL
ALT: 10 IU/L (ref 0–32)
AST: 14 IU/L (ref 0–40)
Albumin/Globulin Ratio: 1.2 (ref 1.1–2.5)
Albumin: 4.1 g/dL (ref 3.5–5.5)
Alkaline Phosphatase: 100 IU/L (ref 39–117)
BUN/Creatinine Ratio: 11 (ref 9–23)
BUN: 9 mg/dL (ref 6–24)
CALCIUM: 9.2 mg/dL (ref 8.7–10.2)
CHLORIDE: 103 mmol/L (ref 97–108)
CO2: 23 mmol/L (ref 18–29)
Creatinine, Ser: 0.8 mg/dL (ref 0.57–1.00)
GFR calc Af Amer: 97 mL/min/{1.73_m2} (ref >59)
GFR, EST NON AFRICAN AMERICAN: 84 mL/min/{1.73_m2} (ref >59)
Globulin, Total: 3.4 g/dL (ref 1.5–4.5)
Glucose: 100 mg/dL — ABNORMAL HIGH (ref 65–99)
POTASSIUM: 4.2 mmol/L (ref 3.5–5.2)
SODIUM: 141 mmol/L (ref 134–144)
Total Bilirubin: 0.3 mg/dL (ref 0.0–1.2)
Total Protein: 7.5 g/dL (ref 6.0–8.5)

## 2013-04-13 LAB — HEMOGLOBIN A1C
ESTIMATED AVERAGE GLUCOSE: 140 mg/dL
Hgb A1c MFr Bld: 6.5 % — ABNORMAL HIGH (ref 4.8–5.6)

## 2013-04-13 LAB — TSH: TSH: 0.211 u[IU]/mL — AB (ref 0.450–4.500)

## 2013-04-14 ENCOUNTER — Other Ambulatory Visit: Payer: Self-pay

## 2013-04-14 MED ORDER — LEVOTHYROXINE SODIUM 150 MCG PO TABS: 150.0000 ug | ORAL_TABLET | Freq: Every day | ORAL | Status: DC

## 2013-04-14 NOTE — Telephone Encounter (Signed)
Spoke with patient about recent lab results, per Dr.Pandey decrease thyroid medication to 150 mcg from 200 mcg

## 2013-04-15 ENCOUNTER — Telehealth: Payer: Self-pay

## 2013-04-15 NOTE — Telephone Encounter (Signed)
Pt notified VIA phone and appt scheduled for 3/31 @ 10:15 am.

## 2013-04-15 NOTE — Telephone Encounter (Signed)
lft message @ home number [phone]

## 2013-04-15 NOTE — Telephone Encounter (Signed)
No new medication was started that would cause these symptoms. i will need to talk to her to evaluate more. pls make appointment

## 2013-04-15 NOTE — Telephone Encounter (Signed)
Patient called triage line indicating she has tingling in hands and feet, symptoms onset since last OV. Patient informed she will need an appointment to discuss. Patient would like for Dr.Pandey to review message and give any recommendations  Dr.Pandey please advise

## 2013-04-19 ENCOUNTER — Ambulatory Visit (INDEPENDENT_AMBULATORY_CARE_PROVIDER_SITE_OTHER): Payer: Medicare Other | Admitting: Internal Medicine

## 2013-04-19 ENCOUNTER — Encounter: Payer: Self-pay | Admitting: Internal Medicine

## 2013-04-19 VITALS — BP 128/76 | HR 90 | Temp 97.1°F | Wt 168.0 lb

## 2013-04-19 DIAGNOSIS — R209 Unspecified disturbances of skin sensation: Secondary | ICD-10-CM

## 2013-04-19 DIAGNOSIS — G5690 Unspecified mononeuropathy of unspecified upper limb: Secondary | ICD-10-CM

## 2013-04-19 DIAGNOSIS — E119 Type 2 diabetes mellitus without complications: Secondary | ICD-10-CM

## 2013-04-19 DIAGNOSIS — M792 Neuralgia and neuritis, unspecified: Secondary | ICD-10-CM

## 2013-04-19 DIAGNOSIS — R202 Paresthesia of skin: Secondary | ICD-10-CM | POA: Insufficient documentation

## 2013-04-19 MED ORDER — GABAPENTIN 100 MG PO CAPS: 100.0000 mg | ORAL_CAPSULE | Freq: Three times a day (TID) | ORAL | Status: DC

## 2013-04-19 NOTE — Progress Notes (Signed)
Patient ID: Toni Moses, female   DOB: 03/22/1960, 53 y.o.   MRN: [id number]    Chief Complaint  Patient presents with  . Acute Visit    tingling in fingers and toes and intermittent pain   Allergies  Allergen Reactions  . Citalopram Other (See Comments)    "made her feel bad"   HPI  53 y/o female pt is here for acute visit. She complaints of tingling in her fingers of her hands and feet and pain intermittently for few weeks. Denies muscle cramps. The tingling is in all the fingers She has history of diet controlled dm  ROS No falls/ trauma No loss of balance No chest pain, dyspnea, palpitations No dizziness or lightheadedness  Past Medical History  Diagnosis Date  . Thyroid disease   . Hypercholesteremia   . Anxiety   . Lumbago   . Hypertension   . Enlargement of lymph nodes   . Benign neoplasm of lymph nodes   . Other atopic dermatitis and related conditions   . Other abnormal blood chemistry   . Unspecified schizophrenia, unspecified condition   . Bipolar I disorder, most recent episode (or current) unspecified   . Depression   . Myalgia and myositis, unspecified   . Hypothyroidism   . Diabetes mellitus type 2, controlled    Current Outpatient Prescriptions on File Prior to Visit  Medication Sig Dispense Refill  . acetaminophen (TYLENOL) 500 MG tablet Take 1,000 mg by mouth every 6 (six) hours as needed for moderate pain.      Marland Kitchen ALPRAZolam (XANAX) 1 MG tablet Take one tablet by mouth three times daily as needed for anxiety  90 tablet  1  . amLODipine (NORVASC) 5 MG tablet Take 1 tablet (5 mg total) by mouth daily.  30 tablet  5  . HYDROcodone-acetaminophen (NORCO) 5-325 MG per tablet Take 1 tablet by mouth every 4 (four) hours as needed for severe pain.  120 tablet  0  . levothyroxine (SYNTHROID, LEVOTHROID) 150 MCG tablet Take 1 tablet (150 mcg total) by mouth daily before breakfast.  30 tablet  1  . methocarbamol (ROBAXIN) 500 MG tablet Take 1 tablet (500 mg  total) by mouth every 8 (eight) hours as needed for muscle spasms.  30 tablet  0  . naproxen (NAPROSYN) 500 MG tablet Take 1 tablet (500 mg total) by mouth 2 (two) times daily with a meal.  20 tablet  0  . pravastatin (PRAVACHOL) 80 MG tablet Take 1 tablet (80 mg total) by mouth daily.  30 tablet  5  . QUEtiapine (SEROQUEL) 50 MG tablet Take 1 tablet (50 mg total) by mouth at bedtime.  30 tablet  3  . triamcinolone cream (KENALOG) 0.5 % Apply 1 application topically 2 (two) times daily as needed. For eczema  30 g  3   No current facility-administered medications on file prior to visit.   Physical exam BP 128/76  Pulse 90  Temp(Src) 97.1 F (36.2 C) (Oral)  Wt 168 lb (76.204 kg)  SpO2 98%  Constitutional: She is oriented to person, place, and time. No distress. overweight   HENT:  Head: Normocephalic and atraumatic.   Mouth/Throat: Oropharynx is clear and moist.  no sores Eyes: Pupils are equal, round, and reactive to light.  Neck: Normal range of motion. Neck supple. Mobile, non tender right supraclavicular LN  . No axillary lymph nodes Cardiovascular: Normal rate, regular rhythm and normal heart sounds.    No murmur heard. Pulmonary/Chest: Effort  normal and breath sounds normal. No respiratory distress. She has no wheezes.   Abdominal: Soft. Bowel sounds are normal. She exhibits no distension and no mass.  Musculoskeletal: no muscle tenderness, ROM limited in right shoulder Neurological: She is alert and oriented to person, place, and time.  normal sensation to pinprick and vibration Skin: Skin is warm. She is not diaphoretic. No pallor. Eczema present  Psychiatric: Judgment and thought content normal. Mood better today  Labs- Lab Results  Component Value Date   WBC 7.7 11/03/2012   HGB 12.9 11/03/2012   HCT 38.2 11/03/2012   MCV 83.2 11/03/2012   PLT 362 11/03/2012   Lab Results  Component Value Date   HGBA1C 6.5* 04/12/2013    Assessment/plan  1. Tingling in  extremities Concerns for neuropathy causing this in setting of her dm and hypothyroidism. Will need to rule out other causes like b12 deficiency - gabapentin (NEURONTIN) 100 MG capsule; Take 1 capsule (100 mg total) by mouth 3 (three) times daily.  Dispense: 90 capsule; Refill: 3 - Vitamin B12 - Folate  2. Diabetes mellitus type 2, controlled Diet and exercise for weight loss encouraged. If repeat a1c is > 6.5, consider medication   3. Neuropathic pain of finger - gabapentin (NEURONTIN) 100 MG capsule; Take 1 capsule (100 mg total) by mouth 3 (three) times daily.  Dispense: 90 capsule; Refill: 3 - Vitamin B12 - Folate

## 2013-04-20 LAB — VITAMIN B12: Vitamin B-12: 499 pg/mL (ref 211–946)

## 2013-04-20 LAB — FOLATE: FOLATE: 7.4 ng/mL (ref >3.0)

## 2013-04-21 ENCOUNTER — Other Ambulatory Visit: Payer: Self-pay | Admitting: Internal Medicine

## 2013-04-25 ENCOUNTER — Other Ambulatory Visit: Payer: Self-pay | Admitting: *Deleted

## 2013-04-28 ENCOUNTER — Other Ambulatory Visit: Payer: Self-pay

## 2013-04-28 ENCOUNTER — Other Ambulatory Visit: Payer: Self-pay | Admitting: *Deleted

## 2013-04-28 MED ORDER — HYDROCODONE-ACETAMINOPHEN 5-325 MG PO TABS: 1.0000 | ORAL_TABLET | ORAL | Status: DC | PRN

## 2013-05-27 ENCOUNTER — Other Ambulatory Visit: Payer: Self-pay | Admitting: *Deleted

## 2013-05-27 MED ORDER — HYDROCODONE-ACETAMINOPHEN 5-325 MG PO TABS: 1.0000 | ORAL_TABLET | ORAL | Status: DC | PRN

## 2013-05-27 NOTE — Telephone Encounter (Signed)
Per Dr. Bubba Camp patient is to do a Drug Screen today before getting her written Rx for pain medication and is to sign a pain contract.

## 2013-06-08 ENCOUNTER — Telehealth: Payer: Self-pay | Admitting: *Deleted

## 2013-06-08 NOTE — Telephone Encounter (Signed)
Patient wanted a Rx called in for Diflucan. Informed patient that we needed to see her before we could call something in and offered her an appointment. Patient stated that she got something OTC and will use it for now and will call later if it doesn't work.

## 2013-06-22 ENCOUNTER — Other Ambulatory Visit: Payer: Self-pay | Admitting: *Deleted

## 2013-06-22 MED ORDER — ALPRAZOLAM 1 MG PO TABS: ORAL_TABLET | ORAL | Status: DC

## 2013-06-22 NOTE — Telephone Encounter (Signed)
oked to refill by Dr. Bubba Camp

## 2013-06-30 ENCOUNTER — Other Ambulatory Visit: Payer: Self-pay | Admitting: *Deleted

## 2013-06-30 MED ORDER — HYDROCODONE-ACETAMINOPHEN 5-325 MG PO TABS: 1.0000 | ORAL_TABLET | ORAL | Status: DC | PRN

## 2013-06-30 NOTE — Progress Notes (Signed)
Script given to patient for oxycodone.

## 2013-07-13 ENCOUNTER — Encounter: Payer: Self-pay | Admitting: Internal Medicine

## 2013-07-13 ENCOUNTER — Ambulatory Visit (INDEPENDENT_AMBULATORY_CARE_PROVIDER_SITE_OTHER): Payer: BC Managed Care – PPO | Admitting: Internal Medicine

## 2013-07-13 VITALS — BP 150/92 | HR 92 | Temp 98.0°F | Resp 18 | Ht 65.0 in | Wt 173.4 lb

## 2013-07-13 DIAGNOSIS — E663 Overweight: Secondary | ICD-10-CM | POA: Insufficient documentation

## 2013-07-13 DIAGNOSIS — F3289 Other specified depressive episodes: Secondary | ICD-10-CM

## 2013-07-13 DIAGNOSIS — M792 Neuralgia and neuritis, unspecified: Secondary | ICD-10-CM | POA: Insufficient documentation

## 2013-07-13 DIAGNOSIS — E7800 Pure hypercholesterolemia, unspecified: Secondary | ICD-10-CM

## 2013-07-13 DIAGNOSIS — E038 Other specified hypothyroidism: Secondary | ICD-10-CM

## 2013-07-13 DIAGNOSIS — E1149 Type 2 diabetes mellitus with other diabetic neurological complication: Secondary | ICD-10-CM

## 2013-07-13 DIAGNOSIS — F32A Depression, unspecified: Secondary | ICD-10-CM

## 2013-07-13 DIAGNOSIS — Z1211 Encounter for screening for malignant neoplasm of colon: Secondary | ICD-10-CM

## 2013-07-13 DIAGNOSIS — E1142 Type 2 diabetes mellitus with diabetic polyneuropathy: Secondary | ICD-10-CM

## 2013-07-13 DIAGNOSIS — F329 Major depressive disorder, single episode, unspecified: Secondary | ICD-10-CM

## 2013-07-13 DIAGNOSIS — I10 Essential (primary) hypertension: Secondary | ICD-10-CM

## 2013-07-13 DIAGNOSIS — F17200 Nicotine dependence, unspecified, uncomplicated: Secondary | ICD-10-CM

## 2013-07-13 DIAGNOSIS — G8929 Other chronic pain: Secondary | ICD-10-CM | POA: Insufficient documentation

## 2013-07-13 DIAGNOSIS — Z720 Tobacco use: Secondary | ICD-10-CM | POA: Insufficient documentation

## 2013-07-13 DIAGNOSIS — IMO0002 Reserved for concepts with insufficient information to code with codable children: Secondary | ICD-10-CM

## 2013-07-13 MED ORDER — LEVOTHYROXINE SODIUM 150 MCG PO TABS: 150.0000 ug | ORAL_TABLET | Freq: Every day | ORAL | Status: DC

## 2013-07-13 MED ORDER — TRIAMCINOLONE ACETONIDE 0.5 % EX CREA: 1.0000 "application " | TOPICAL_CREAM | Freq: Two times a day (BID) | CUTANEOUS | Status: DC | PRN

## 2013-07-13 MED ORDER — PNEUMOCOCCAL 13-VAL CONJ VACC IM SUSP
0.5000 mL | INTRAMUSCULAR | Status: DC
Start: 2013-07-13 — End: 2014-11-30

## 2013-07-13 MED ORDER — AMLODIPINE BESYLATE 10 MG PO TABS: 5.0000 mg | ORAL_TABLET | Freq: Every day | ORAL | Status: DC

## 2013-07-13 NOTE — Progress Notes (Signed)
Patient ID: Toni Moses, female   DOB: 09-01-1960, 53 y.o.   MRN: [id number]    Chief Complaint  Patient presents with  . Follow-up   Allergies  Allergen Reactions  . Citalopram Other (See Comments)    "made her feel bad"   HPI 53 y/o female patient is here for follow up. She has diet controlled DM, neuropathic pain, hypothyroidism, HTN among others. She continues to smoke. Has not had a screening colonoscopy. No rectal bleed or bowel problem reported. Mood has been better on seroquel but still has lot of family stress/ issues going on. Compliant with her medication. Has not required robaxin recently. bp elevated in office  Review of Systems   Constitutional: Negative for fever, chills and diaphoresis.   HENT: Negative for congestion.    Respiratory: Negative for cough and shortness of breath.    Cardiovascular: Negative for chest pain, palpitations and leg swelling.   Gastrointestinal: Negative for heartburn, nausea, vomiting, abdominal pain and constipation.   Genitourinary: Negative for dysuria.   Musculoskeletal: Negative for falls.   Skin: Negative for rash.   Neurological: Negative for dizziness, seizures, weakness and headaches.   Psychiatric/Behavioral:  Negative for memory loss.   Past Medical History  Diagnosis Date  . Thyroid disease   . Hypercholesteremia   . Anxiety   . Lumbago   . Hypertension   . Enlargement of lymph nodes   . Benign neoplasm of lymph nodes   . Other atopic dermatitis and related conditions   . Other abnormal blood chemistry   . Unspecified schizophrenia, unspecified condition   . Bipolar I disorder, most recent episode (or current) unspecified   . Depression   . Myalgia and myositis, unspecified   . Hypothyroidism   . Diabetes mellitus type 2, controlled    Current Outpatient Prescriptions on File Prior to Visit  Medication Sig Dispense Refill  . acetaminophen (TYLENOL) 500 MG tablet Take 1,000 mg by mouth every 6 (six) hours as  needed for moderate pain.      Marland Kitchen ALPRAZolam (XANAX) 1 MG tablet Take one tablet by mouth three times daily as needed for anxiety  90 tablet  0  . gabapentin (NEURONTIN) 100 MG capsule Take 1 capsule (100 mg total) by mouth 3 (three) times daily.  90 capsule  3  . HYDROcodone-acetaminophen (NORCO) 5-325 MG per tablet Take 1 tablet by mouth every 4 (four) hours as needed for severe pain.  120 tablet  0  . naproxen (NAPROSYN) 500 MG tablet Take 1 tablet (500 mg total) by mouth 2 (two) times daily with a meal.  20 tablet  0  . pravastatin (PRAVACHOL) 80 MG tablet Take 1 tablet (80 mg total) by mouth daily.  30 tablet  5  . QUEtiapine (SEROQUEL) 50 MG tablet Take 1 tablet (50 mg total) by mouth at bedtime.  30 tablet  3   No current facility-administered medications on file prior to visit.   Physical exam BP 150/92  Pulse 92  Temp(Src) 98 F (36.7 C) (Oral)  Resp 18  Ht 5\' 5"  (1.651 m)  Wt 173 lb 6.4 oz (78.654 kg)  BMI 28.86 kg/m2  SpO2 97%  Constitutional: She is oriented to person, place, and time. No distress. overweight   HENT:  Head: Normocephalic and atraumatic.   Mouth/Throat: Oropharynx is clear and moist.   Eyes: Pupils are equal, round, and reactive to light.  Neck: Normal range of motion. Neck supple. Mobile, non tender right supraclavicular LN persists-  unchanged size on palpation. No axillary lymph nodes Cardiovascular: Normal rate, regular rhythm and normal heart sounds.  good dorsalis pedis No murmur heard. Pulmonary/Chest: Effort normal and breath sounds normal. No respiratory distress. She has no wheezes.   Abdominal: Soft. Bowel sounds are normal. She exhibits no distension and no mass.  Musculoskeletal: tenderness around acromial process. Limited overhead abduction Neurological: She is alert and oriented to person, place, and time.  normal pinprick and vibration sensation in feet.  Skin: Skin is warm. She is not diaphoretic. No pallor. Eczema present  Psychiatric:  Judgment and thought content normal. Mood better today  Labs Lab Results  Component Value Date   WBC 7.7 11/03/2012   HGB 12.9 11/03/2012   HCT 38.2 11/03/2012   MCV 83.2 11/03/2012   PLT 362 11/03/2012   Lab Results  Component Value Date   HGBA1C 6.5* 04/12/2013   Lab Results  Component Value Date   CREATININE 0.80 04/12/2013    Assessment/plan  1. Essential hypertension Will increase amlodipine to 10 mg daily and monitor bp at home. Exercise and weight loss encouraged - CMP - CBC with Differential  2. Other specified hypothyroidism Continue levothyroxine and check tsh today - CBC with Differential - TSH  3. Depression seroquel helpful, continue current regimen  4. Hypercholesteremia Continue pravachol, check lipid today. Dietary counselling done - Lipid Panel  5. DM type 2 with diabetic peripheral neuropathy Diet controlled for now. Continue neurontin for nerve pain. Check urine microalbumin. Continue statin. Consider aspirin initiation next visit. Normal foot exam - Hemoglobin A1c - Microalbumin/Creatinine Ratio, Urine  6. Neuropathic pain Continue neurontin. Drug screen today  7. Chronic pain Continue naproxen and prn hydrocodone-apap prn and will do drug screen  8. Colon cancer screening Does not want to undergo colonoscopy or sigmoidoscopy, ocassional constipation but no melena or rectal bleed. Will provide FOBT card once a year  23. Tobacco use Continues to smoke. counselling provided. Not willing to smoke at present. Will readdress next visit again  10. Overweight Weight loss with diet and exercise encouraged.  prevnar 13 provided today

## 2013-07-14 LAB — MICROALBUMIN / CREATININE URINE RATIO
CREATININE UR: 154.3 mg/dL (ref 15.0–278.0)
MICROALB/CREAT RATIO: 15 mg/g creat (ref 0.0–30.0)
Microalbumin, Urine: 23.2 ug/mL — ABNORMAL HIGH (ref 0.0–17.0)

## 2013-07-14 LAB — CBC WITH DIFFERENTIAL/PLATELET
BASOS: 0 %
Basophils Absolute: 0 10*3/uL (ref 0.0–0.2)
EOS: 4 %
Eosinophils Absolute: 0.2 10*3/uL (ref 0.0–0.4)
HEMATOCRIT: 37.3 % (ref 34.0–46.6)
Hemoglobin: 12 g/dL (ref 11.1–15.9)
Immature Grans (Abs): 0 10*3/uL (ref 0.0–0.1)
Immature Granulocytes: 0 %
LYMPHS ABS: 1.2 10*3/uL (ref 0.7–3.1)
Lymphs: 26 %
MCH: 26.1 pg — ABNORMAL LOW (ref 26.6–33.0)
MCHC: 32.2 g/dL (ref 31.5–35.7)
MCV: 81 fL (ref 79–97)
MONOCYTES: 8 %
Monocytes Absolute: 0.4 10*3/uL (ref 0.1–0.9)
Neutrophils Absolute: 2.9 10*3/uL (ref 1.4–7.0)
Neutrophils Relative %: 62 %
RBC: 4.6 x10E6/uL (ref 3.77–5.28)
RDW: 18.6 % — ABNORMAL HIGH (ref 12.3–15.4)
WBC: 4.8 10*3/uL (ref 3.4–10.8)

## 2013-07-14 LAB — COMPREHENSIVE METABOLIC PANEL
A/G RATIO: 1.3 (ref 1.1–2.5)
ALK PHOS: 81 IU/L (ref 39–117)
ALT: 9 IU/L (ref 0–32)
AST: 22 IU/L (ref 0–40)
Albumin: 4.3 g/dL (ref 3.5–5.5)
BILIRUBIN TOTAL: 0.2 mg/dL (ref 0.0–1.2)
BUN / CREAT RATIO: 11 (ref 9–23)
BUN: 11 mg/dL (ref 6–24)
CO2: 21 mmol/L (ref 18–29)
Calcium: 9.4 mg/dL (ref 8.7–10.2)
Chloride: 103 mmol/L (ref 97–108)
Creatinine, Ser: 0.99 mg/dL (ref 0.57–1.00)
GFR, EST AFRICAN AMERICAN: 75 mL/min/{1.73_m2} (ref >59)
GFR, EST NON AFRICAN AMERICAN: 65 mL/min/{1.73_m2} (ref >59)
Globulin, Total: 3.3 g/dL (ref 1.5–4.5)
Glucose: 87 mg/dL (ref 65–99)
POTASSIUM: 4.1 mmol/L (ref 3.5–5.2)
Sodium: 139 mmol/L (ref 134–144)
Total Protein: 7.6 g/dL (ref 6.0–8.5)

## 2013-07-14 LAB — LIPID PANEL
CHOL/HDL RATIO: 3.3 ratio (ref 0.0–4.4)
Cholesterol, Total: 280 mg/dL — ABNORMAL HIGH (ref 100–199)
HDL: 84 mg/dL (ref >39)
LDL Calculated: 159 mg/dL — ABNORMAL HIGH (ref 0–99)
Triglycerides: 184 mg/dL — ABNORMAL HIGH (ref 0–149)
VLDL Cholesterol Cal: 37 mg/dL (ref 5–40)

## 2013-07-14 LAB — HEMOGLOBIN A1C
Est. average glucose Bld gHb Est-mCnc: 134 mg/dL
Hgb A1c MFr Bld: 6.3 % — ABNORMAL HIGH (ref 4.8–5.6)

## 2013-07-14 LAB — TSH: TSH: 81.67 u[IU]/mL — AB (ref 0.450–4.500)

## 2013-07-15 ENCOUNTER — Telehealth: Payer: Self-pay | Admitting: *Deleted

## 2013-07-15 NOTE — Telephone Encounter (Signed)
Spoke with patient regarding lab results and that she needed to schedule appointment with Dr. Bubba Camp (07/20/2013-02:45p)

## 2013-07-15 NOTE — Telephone Encounter (Signed)
Message copied by Eilene Ghazi on Fri Jul 15, 2013  4:22 PM ------      Message from: Blanchie Serve      Created: Fri Jul 15, 2013 12:31 PM       Please make appointment next week for her to see me to review result. Her urine result should be back by then and i would like that in her chart. Overbook her if we need to. thanks ------

## 2013-07-19 ENCOUNTER — Other Ambulatory Visit: Payer: Self-pay | Admitting: Internal Medicine

## 2013-07-20 ENCOUNTER — Ambulatory Visit: Payer: Self-pay | Admitting: Internal Medicine

## 2013-07-20 ENCOUNTER — Ambulatory Visit (INDEPENDENT_AMBULATORY_CARE_PROVIDER_SITE_OTHER): Payer: Medicare Other | Admitting: Internal Medicine

## 2013-07-20 ENCOUNTER — Encounter: Payer: Self-pay | Admitting: Internal Medicine

## 2013-07-20 VITALS — BP 138/80 | HR 82 | Resp 10 | Wt 172.0 lb

## 2013-07-20 DIAGNOSIS — F419 Anxiety disorder, unspecified: Secondary | ICD-10-CM

## 2013-07-20 DIAGNOSIS — I10 Essential (primary) hypertension: Secondary | ICD-10-CM

## 2013-07-20 DIAGNOSIS — F3289 Other specified depressive episodes: Secondary | ICD-10-CM

## 2013-07-20 DIAGNOSIS — F411 Generalized anxiety disorder: Secondary | ICD-10-CM

## 2013-07-20 DIAGNOSIS — L259 Unspecified contact dermatitis, unspecified cause: Secondary | ICD-10-CM

## 2013-07-20 DIAGNOSIS — F329 Major depressive disorder, single episode, unspecified: Secondary | ICD-10-CM

## 2013-07-20 DIAGNOSIS — E1142 Type 2 diabetes mellitus with diabetic polyneuropathy: Secondary | ICD-10-CM

## 2013-07-20 DIAGNOSIS — E7800 Pure hypercholesterolemia, unspecified: Secondary | ICD-10-CM

## 2013-07-20 DIAGNOSIS — E038 Other specified hypothyroidism: Secondary | ICD-10-CM

## 2013-07-20 DIAGNOSIS — E1149 Type 2 diabetes mellitus with other diabetic neurological complication: Secondary | ICD-10-CM

## 2013-07-20 DIAGNOSIS — G8929 Other chronic pain: Secondary | ICD-10-CM

## 2013-07-20 DIAGNOSIS — F32A Depression, unspecified: Secondary | ICD-10-CM

## 2013-07-20 NOTE — Progress Notes (Signed)
Patient ID: Toni Moses, female   DOB: 1960/01/27, 53 y.o.   MRN: [id number]    Chief Complaint  Patient presents with  . Results    review lab results  . Rash    Examine rash on left arm  . Depression    persists   . Medication Refill    Renew Alprazolam (last filled 06/22/13)    Allergies  Allergen Reactions  . Citalopram Other (See Comments)    "made her feel bad"   HPI 53 y/o female patient is here for follow up on her labs Her thyroid level was grossly abnormal at 81.6. She mentions that she has not taken her thyroid medication for 2 and a half weeks as she was feeling low and stopped taking most of her medication. On her last visit, she had told me she was taking all medications as prescribed. She was started on pravastatin in December but mentions she has not taken this. Repeat lipid panel has abnormal cholesterol reading She continues to smoke.  Mood has been better on seroquel but still has lot of family stress/ issues going on. Compliant with her medication otherwise. Patients would like to see a psychiatrist now (have discussed this before with patient but cost was an issue due to insurance reason) On review of her urine drug screen, it is positive for alprazolam and negative for gabapentin and hydrocodone  She has also had rash on her left arm, she mentions trying a new soap  Review of Systems   Constitutional: Negative for fever, chills and diaphoresis.   HENT: Negative for congestion.    Respiratory: Negative for cough and shortness of breath.    Cardiovascular: Negative for chest pain, palpitations and leg swelling.   Gastrointestinal: Negative for heartburn, nausea, vomiting, abdominal pain and constipation.   Genitourinary: Negative for dysuria.   Musculoskeletal: Negative for falls.   Skin: Negative for rash.   Neurological: Negative for dizziness, seizures, weakness and headaches. Has numbness in her fingers and toes and tends to lose balance at times. Not  using any assistive device Psychiatric/Behavioral:  Negative for memory loss. has depression    Past Medical History  Diagnosis Date  . Thyroid disease   . Hypercholesteremia   . Anxiety   . Lumbago   . Hypertension   . Enlargement of lymph nodes   . Benign neoplasm of lymph nodes   . Other atopic dermatitis and related conditions   . Other abnormal blood chemistry   . Unspecified schizophrenia, unspecified condition   . Bipolar I disorder, most recent episode (or current) unspecified   . Depression   . Myalgia and myositis, unspecified   . Hypothyroidism   . Diabetes mellitus type 2, controlled    Current Outpatient Prescriptions on File Prior to Visit  Medication Sig Dispense Refill  . acetaminophen (TYLENOL) 500 MG tablet Take 1,000 mg by mouth every 6 (six) hours as needed for moderate pain.      Marland Kitchen amLODipine (NORVASC) 10 MG tablet Take 0.5 tablets (5 mg total) by mouth daily.  30 tablet  5  . gabapentin (NEURONTIN) 100 MG capsule Take 1 capsule (100 mg total) by mouth 3 (three) times daily.  90 capsule  3  . HYDROcodone-acetaminophen (NORCO) 5-325 MG per tablet Take 1 tablet by mouth every 4 (four) hours as needed for severe pain.  120 tablet  0  . levothyroxine (SYNTHROID, LEVOTHROID) 150 MCG tablet Take 1 tablet (150 mcg total) by mouth daily before breakfast.  30 tablet  3  . naproxen (NAPROSYN) 500 MG tablet Take 1 tablet (500 mg total) by mouth 2 (two) times daily with a meal.  20 tablet  0  . QUEtiapine (SEROQUEL) 50 MG tablet Take 1 tablet (50 mg total) by mouth at bedtime.  30 tablet  3  . triamcinolone cream (KENALOG) 0.5 % Apply 1 application topically 2 (two) times daily as needed. For eczema  30 g  3  . ALPRAZolam (XANAX) 1 MG tablet take 1 tablet by mouth three times a day if needed for anxiety  90 tablet  0  . pravastatin (PRAVACHOL) 80 MG tablet Take 1 tablet (80 mg total) by mouth daily.  30 tablet  5   Current Facility-Administered Medications on File Prior to  Visit  Medication Dose Route Frequency Provider Last Rate Last Dose  . pneumococcal 13-valent conjugate vaccine (PREVNAR 13) injection 0.5 mL  0.5 mL Intramuscular Tomorrow-1000 Blanchie Serve, MD       Physical exam BP 138/80  Pulse 82  Resp 10  Wt 172 lb (78.019 kg)  SpO2 98%  Constitutional: She is oriented to person, place, and time. No distress. overweight   HENT:  Head: Normocephalic and atraumatic.   Mouth/Throat: Oropharynx is clear and moist.   Eyes: Pupils are equal, round, and reactive to light.  Neck: Normal range of motion. Neck supple. Mobile, non tender right supraclavicular LN persists- unchanged size on palpation. Cardiovascular: Normal rate, regular rhythm and normal heart sounds.  good dorsalis pedis No murmur heard. Pulmonary/Chest: Effort normal and breath sounds normal. No respiratory distress. She has no wheezes.   Abdominal: Soft. Bowel sounds are normal. She exhibits no distension and no mass.  Neurological: She is alert and oriented to person, place, and time.  normal pinprick and vibration sensation in feet.   Skin: Skin is warm. She is not diaphoretic. No pallor. Eczema present and has macular rash on left arm, no open sores or drainage noted Psychiatric: Judgment and thought content normal. Mood stable  Labs- Lab Results  Component Value Date   HGBA1C 6.3* 07/13/2013   Lab Results  Component Value Date   TSH 81.670* 07/13/2013   Lipid Panel     Component Value Date/Time   TRIG 184* 07/13/2013 1158   HDL 84 07/13/2013 1158   CHOLHDL 3.3 07/13/2013 1158   LDLCALC 159* 07/13/2013 1158   Assessment/plan  1. DM type 2 with diabetic peripheral neuropathy Controlled sugar reading. Off all medication at present. Monitor clinically  2. Other specified hypothyroidism Need to take the medication explained. Pt voices understanding this. Resume levothyroxine 150 mcg daily. Will recheck thyroid level next visit  3. Essential hypertension Has not been taking  increased dose of amlodipine, on 5 mg daily for now and bp stable. Continue current regimen  4. Chronic pain Was on norco here. 2 drug screen urine test has been negative for the medication in her urine. Discussed this with patient. Will d/c norco and provide pain clinic referral as pt has broken pain contract with the clinic. Pt understands and agrees with care plan - Ambulatory referral to Pain Clinic  5. Hypercholesteremia Pt will start taking pravastatin 80 mg daily from today. Monitor clinically  6. Anxiety Continue alprazolam, refill provided  7. Depression Continue seroquel, names of psychiatrist in town provided. Patient to make herself an appointment for further evaluation of her depression  8. Contact dermatitis From her new soap. To stop using this for now. Can use her triamcinolone  cream on this and some lactocalamine lotion to help with itching sensation

## 2013-07-29 ENCOUNTER — Encounter: Payer: Self-pay | Admitting: Internal Medicine

## 2013-08-01 ENCOUNTER — Telehealth: Payer: Self-pay | Admitting: *Deleted

## 2013-08-01 NOTE — Telephone Encounter (Signed)
Patient called and stated that her rash on her arm is no better. The cream is not working and wants something else called in. Please Advise

## 2013-08-01 NOTE — Telephone Encounter (Signed)
Stop triamcinolone ointment for now. Send script for betamethasone dipropionate cream 0.05% once a day to affected area for a week. If no improvement or worsening of rash, i will need to see her in the office

## 2013-08-02 MED ORDER — BETAMETHASONE DIPROPIONATE 0.05 % EX CREA: TOPICAL_CREAM | CUTANEOUS | Status: DC

## 2013-08-02 NOTE — Telephone Encounter (Signed)
Patient Notified and faxed Rx into pharmacy. Patient wanted to speak with Manager gave Caren Griffins patient's number and told patient that she would call her back. Patient agreed.

## 2013-08-03 MED ORDER — MOMETASONE FUROATE 0.1 % EX OINT: TOPICAL_OINTMENT | CUTANEOUS | Status: DC

## 2013-08-03 NOTE — Telephone Encounter (Signed)
Mometasone furoate ointment 0.1% twice a day to affected area for 10 days and reassess

## 2013-08-03 NOTE — Addendum Note (Signed)
Addended by: Rafael Bihari A on: 08/03/2013 02:24 PM   Modules accepted: Orders, Medications

## 2013-08-03 NOTE — Telephone Encounter (Signed)
Patient Notified and faxed Rx into pharmacy 

## 2013-08-03 NOTE — Telephone Encounter (Signed)
Patient called and stated that she cannot afford the Betamethasone cream it is $73.00. Needs something cheaper. Please Advise.

## 2013-08-16 ENCOUNTER — Ambulatory Visit: Payer: Medicare Other | Admitting: Internal Medicine

## 2013-08-27 ENCOUNTER — Other Ambulatory Visit: Payer: Self-pay | Admitting: Internal Medicine

## 2013-09-12 ENCOUNTER — Telehealth: Payer: Self-pay | Admitting: *Deleted

## 2013-09-12 NOTE — Telephone Encounter (Signed)
Patient called requesting Prednisone for a rash that has not gotten better. In Dr. Jackolyn Confer previous message it stated that if no better after treatment of cream she needed to see her back in the office for a follow up. Called and left message for patient to schedule a follow up appointment.

## 2013-09-20 ENCOUNTER — Telehealth: Payer: Self-pay | Admitting: Internal Medicine

## 2013-09-20 NOTE — Telephone Encounter (Signed)
Received a fax from [place] for [facility] today stating   Our office has reviewed the new patient referral noted above. Laretta Alstrom) At this time we do not have any additional pain / rehab management options to offer your patient.   Notified Dr. Bubba Camp and she stated patient would not be receiving pain medication from our office.  Called and notified patient.

## 2013-10-01 ENCOUNTER — Other Ambulatory Visit: Payer: Self-pay | Admitting: Internal Medicine

## 2013-10-21 ENCOUNTER — Other Ambulatory Visit: Payer: Self-pay

## 2013-10-21 DIAGNOSIS — E7800 Pure hypercholesterolemia, unspecified: Secondary | ICD-10-CM

## 2013-10-21 DIAGNOSIS — E039 Hypothyroidism, unspecified: Secondary | ICD-10-CM

## 2013-10-21 DIAGNOSIS — I10 Essential (primary) hypertension: Secondary | ICD-10-CM

## 2013-10-21 DIAGNOSIS — E1144 Type 2 diabetes mellitus with diabetic amyotrophy: Secondary | ICD-10-CM

## 2013-10-24 ENCOUNTER — Other Ambulatory Visit: Payer: Medicare Other

## 2013-10-26 ENCOUNTER — Encounter: Payer: Medicare Other | Admitting: Internal Medicine

## 2013-10-27 ENCOUNTER — Telehealth: Payer: Self-pay | Admitting: *Deleted

## 2013-10-27 NOTE — Telephone Encounter (Signed)
Per Dr. Bubba Camp-- patient can get her Rx at her appointment. LMOM for patient to return call.

## 2013-10-27 NOTE — Telephone Encounter (Signed)
Patient Notified and agreed. 
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
## 2013-11-08 ENCOUNTER — Ambulatory Visit (INDEPENDENT_AMBULATORY_CARE_PROVIDER_SITE_OTHER): Payer: Medicare Other | Admitting: Internal Medicine

## 2013-11-08 ENCOUNTER — Telehealth: Payer: Self-pay | Admitting: Internal Medicine

## 2013-11-08 ENCOUNTER — Encounter: Payer: Self-pay | Admitting: Internal Medicine

## 2013-11-08 ENCOUNTER — Other Ambulatory Visit: Payer: Self-pay | Admitting: Internal Medicine

## 2013-11-08 VITALS — BP 140/90 | HR 98 | Temp 98.3°F | Resp 18 | Ht 65.0 in | Wt 174.4 lb

## 2013-11-08 DIAGNOSIS — Z1211 Encounter for screening for malignant neoplasm of colon: Secondary | ICD-10-CM

## 2013-11-08 DIAGNOSIS — I10 Essential (primary) hypertension: Secondary | ICD-10-CM

## 2013-11-08 DIAGNOSIS — E1142 Type 2 diabetes mellitus with diabetic polyneuropathy: Secondary | ICD-10-CM

## 2013-11-08 DIAGNOSIS — R202 Paresthesia of skin: Secondary | ICD-10-CM

## 2013-11-08 DIAGNOSIS — E038 Other specified hypothyroidism: Secondary | ICD-10-CM

## 2013-11-08 DIAGNOSIS — F32A Depression, unspecified: Secondary | ICD-10-CM

## 2013-11-08 DIAGNOSIS — F329 Major depressive disorder, single episode, unspecified: Secondary | ICD-10-CM

## 2013-11-08 DIAGNOSIS — E119 Type 2 diabetes mellitus without complications: Secondary | ICD-10-CM

## 2013-11-08 DIAGNOSIS — E7800 Pure hypercholesterolemia, unspecified: Secondary | ICD-10-CM

## 2013-11-08 DIAGNOSIS — Z1239 Encounter for other screening for malignant neoplasm of breast: Secondary | ICD-10-CM

## 2013-11-08 DIAGNOSIS — F419 Anxiety disorder, unspecified: Secondary | ICD-10-CM

## 2013-11-08 DIAGNOSIS — E2839 Other primary ovarian failure: Secondary | ICD-10-CM

## 2013-11-08 DIAGNOSIS — Z1231 Encounter for screening mammogram for malignant neoplasm of breast: Secondary | ICD-10-CM

## 2013-11-08 DIAGNOSIS — G629 Polyneuropathy, unspecified: Secondary | ICD-10-CM

## 2013-11-08 MED ORDER — GABAPENTIN 100 MG PO CAPS: 100.0000 mg | ORAL_CAPSULE | Freq: Every day | ORAL | Status: DC

## 2013-11-08 NOTE — Progress Notes (Signed)
Patient ID: Toni Moses, female   DOB: 02-25-1960, 53 y.o.   MRN: [id number]    Chief Complaint  Patient presents with  . Medical Management of Chronic Issues   Allergies  Allergen Reactions  . Citalopram Other (See Comments)    "made her feel bad"   HPI Pt is here for RV. She has no new concern this visit. She has been having stressful home situation taking care of her own health and her husband's health. Mentions being complaint with her meds. Denies any new concern she has diet controlled DM, neuropathic pain, hypothyroidism, HTN among others.   Review of Systems   Constitutional: Negative for fever, chills and diaphoresis.   HENT: Negative for congestion.    Respiratory: Negative for cough and shortness of breath.    Cardiovascular: Negative for chest pain, palpitations and leg swelling.   Gastrointestinal: Negative for heartburn, nausea, vomiting, abdominal pain and constipation.   Genitourinary: Negative for dysuria.   Musculoskeletal: Negative for falls.   Skin: Negative for rash.   Neurological: Negative for dizziness, seizures, weakness and headaches.   Psychiatric/Behavioral:  Negative for memory loss.       Past Medical History  Diagnosis Date  . Thyroid disease   . Hypercholesteremia   . Anxiety   . Lumbago   . Hypertension   . Enlargement of lymph nodes   . Benign neoplasm of lymph nodes   . Other atopic dermatitis and related conditions   . Other abnormal blood chemistry   . Unspecified schizophrenia, unspecified condition   . Bipolar I disorder, most recent episode (or current) unspecified   . Depression   . Myalgia and myositis, unspecified   . Hypothyroidism   . Diabetes mellitus type 2, controlled    Medication reviewed. See [facility]  Physical exam BP 140/90 mmHg  Pulse 98  Temp(Src) 98.3 F (36.8 C) (Oral)  Resp 18  Ht 5\' 5"  (1.651 m)  Wt 174 lb 6.4 oz (79.107 kg)  BMI 29.02 kg/m2  SpO2 99%  Constitutional: She is oriented to person,  place, and time. No distress. overweight   HENT:  Head: Normocephalic and atraumatic.   Mouth/Throat: Oropharynx is clear and moist.   Eyes: Pupils are equal, round, and reactive to light.  Neck: Normal range of motion. Neck supple. No more supraclavicular LN palpable Cardiovascular: Normal rate, regular rhythm and normal heart sounds.  good dorsalis pedis No murmur heard. Pulmonary/Chest: Effort normal and breath sounds normal. No respiratory distress. She has no wheezes.   Abdominal: Soft. Bowel sounds are normal. She exhibits no distension and no mass.   Neurological: She is alert and oriented to person, place, and time. Skin: Skin is warm. She is not diaphoretic. No pallor.  Psychiatric: Judgment and thought content normal. Mood stable  labs Lipid Panel     Component Value Date/Time   TRIG 184* 07/13/2013 1158   HDL 84 07/13/2013 1158   CHOLHDL 3.3 07/13/2013 1158   LDLCALC 159* 07/13/2013 1158   Lab Results  Component Value Date   HGBA1C 6.3* 07/13/2013   CBC Latest Ref Rng 07/13/2013 11/03/2012 08/25/2012  WBC 3.4 - 10.8 x10E3/uL 4.8 7.7 7.1  Hemoglobin 11.1 - 15.9 g/dL 12.0 12.9 11.8  Hematocrit 34.0 - 46.6 % 37.3 38.2 33.9(L)  Platelets 150 - 400 K/uL - 362 -   CMP     Component Value Date/Time   NA 139 07/13/2013 1158   NA 137 11/03/2012 1935   K 4.1 07/13/2013 1158  CL 103 07/13/2013 1158   CO2 21 07/13/2013 1158   GLUCOSE 87 07/13/2013 1158   GLUCOSE 93 11/03/2012 1935   BUN 11 07/13/2013 1158   BUN 7 11/03/2012 1935   CREATININE 0.99 07/13/2013 1158   CALCIUM 9.4 07/13/2013 1158   PROT 7.6 07/13/2013 1158   PROT 8.0 12/03/2011 1715   ALBUMIN 3.4* 12/03/2011 1715   AST 22 07/13/2013 1158   ALT 9 07/13/2013 1158   ALKPHOS 81 07/13/2013 1158   BILITOT 0.2 07/13/2013 1158   GFRNONAA 65 07/13/2013 1158   GFRAA 75 07/13/2013 1158   Lab Results  Component Value Date   TSH 81.670* 07/13/2013    Assessment/plan  1. Essential hypertension Continue amlodipine 10 mg daily -  CMP - Lipid Panel - CBC with Differential   2. Diabetes mellitus type 2, controlled Diet controlled for now, exercise counselling provided  3. Other specified hypothyroidism Continue levothyroxine 150 mcg daily - TSH - T3, Free - T4, Free - T3 Uptake - Hemoglobin A1c - DG DXA FRACTURE ASSESSMENT; Future  4. DM type 2 with diabetic peripheral neuropathy Check a1c today, off meds at present - Hemoglobin A1c  5. Anxiety Continue xanax, no change made  6. Depression Continue seroquel, pt mentions mood stable  7. Hypercholesteremia Continue pravacol and check lipids - Lipid Panel - CBC with Differential  8. Tingling in extremities With neuropathy, continue gabapentin  9. Colon cancer screening - Ambulatory referral to Gastroenterology  10. Estrogen deficiency - DG DXA FRACTURE ASSESSMENT; Future  11. Breast cancer screening Mammogram ordered  12. Screening mammogram for high-risk patient - MM Digital Screening; Future

## 2013-11-08 NOTE — Telephone Encounter (Signed)
Toni Moses and myself discussed Toni Moses balance of [phone], she was offered payment arrangement and agreed to pay $47.17 monthly.  Toni Moses is have financial problems at this time, I will give her the # for discuss possible assistance's with her bills for [facility]. FYI to Toni Moses Community education officer to document on billing acct and hold from outside collects for 30 days, pending patient contacting the financial assistance's program. cdavis

## 2013-11-09 LAB — CBC WITH DIFFERENTIAL/PLATELET
BASOS: 1 %
Basophils Absolute: 0 10*3/uL (ref 0.0–0.2)
EOS ABS: 0.1 10*3/uL (ref 0.0–0.4)
Eos: 3 %
HCT: 33.9 % — ABNORMAL LOW (ref 34.0–46.6)
HEMOGLOBIN: 11.4 g/dL (ref 11.1–15.9)
Immature Grans (Abs): 0 10*3/uL (ref 0.0–0.1)
Immature Granulocytes: 0 %
Lymphocytes Absolute: 1 10*3/uL (ref 0.7–3.1)
Lymphs: 23 %
MCH: 27.7 pg (ref 26.6–33.0)
MCHC: 33.6 g/dL (ref 31.5–35.7)
MCV: 83 fL (ref 79–97)
MONOS ABS: 0.3 10*3/uL (ref 0.1–0.9)
Monocytes: 8 %
NEUTROS ABS: 2.8 10*3/uL (ref 1.4–7.0)
NEUTROS PCT: 65 %
RBC: 4.11 x10E6/uL (ref 3.77–5.28)
RDW: 14.4 % (ref 12.3–15.4)
WBC: 4.3 10*3/uL (ref 3.4–10.8)

## 2013-11-09 LAB — COMPREHENSIVE METABOLIC PANEL
ALT: 8 IU/L (ref 0–32)
AST: 12 IU/L (ref 0–40)
Albumin/Globulin Ratio: 1.2 (ref 1.1–2.5)
Albumin: 3.9 g/dL (ref 3.5–5.5)
Alkaline Phosphatase: 97 IU/L (ref 39–117)
BILIRUBIN TOTAL: 0.4 mg/dL (ref 0.0–1.2)
BUN/Creatinine Ratio: 12 (ref 9–23)
BUN: 10 mg/dL (ref 6–24)
CO2: 23 mmol/L (ref 18–29)
Calcium: 9.2 mg/dL (ref 8.7–10.2)
Chloride: 104 mmol/L (ref 97–108)
Creatinine, Ser: 0.85 mg/dL (ref 0.57–1.00)
GFR calc non Af Amer: 78 mL/min/{1.73_m2} (ref >59)
GFR, EST AFRICAN AMERICAN: 90 mL/min/{1.73_m2} (ref >59)
GLUCOSE: 91 mg/dL (ref 65–99)
Globulin, Total: 3.3 g/dL (ref 1.5–4.5)
POTASSIUM: 4.1 mmol/L (ref 3.5–5.2)
Sodium: 142 mmol/L (ref 134–144)
TOTAL PROTEIN: 7.2 g/dL (ref 6.0–8.5)

## 2013-11-09 LAB — TSH: TSH: 1.18 u[IU]/mL (ref 0.450–4.500)

## 2013-11-09 LAB — LIPID PANEL
CHOLESTEROL TOTAL: 219 mg/dL — AB (ref 100–199)
Chol/HDL Ratio: 3.2 ratio units (ref 0.0–4.4)
HDL: 68 mg/dL (ref >39)
LDL Calculated: 129 mg/dL — ABNORMAL HIGH (ref 0–99)
Triglycerides: 112 mg/dL (ref 0–149)
VLDL Cholesterol Cal: 22 mg/dL (ref 5–40)

## 2013-11-09 LAB — T3 UPTAKE
Free Thyroxine Index: 3.2 (ref 1.2–4.9)
T3 UPTAKE RATIO: 27 % (ref 24–39)
T4 TOTAL: 11.7 ug/dL (ref 4.5–12.0)

## 2013-11-09 LAB — T3, FREE: T3, Free: 2.8 pg/mL (ref 2.0–4.4)

## 2013-11-09 LAB — T4, FREE: Free T4: 1.57 ng/dL (ref 0.82–1.77)

## 2013-11-09 LAB — HEMOGLOBIN A1C
ESTIMATED AVERAGE GLUCOSE: 131 mg/dL
HEMOGLOBIN A1C: 6.2 % — AB (ref 4.8–5.6)

## 2013-11-15 ENCOUNTER — Encounter: Payer: BC Managed Care – PPO | Admitting: Internal Medicine

## 2013-11-15 ENCOUNTER — Encounter: Payer: Self-pay | Admitting: *Deleted

## 2013-11-28 ENCOUNTER — Other Ambulatory Visit: Payer: Self-pay

## 2013-11-28 NOTE — Telephone Encounter (Signed)
Message left on triage voicemail- need refill on xanax  After reviewing chart, patient is not due for refill until 12/09/2013 (next Friday). Left message on voicemail for patient to return call when available

## 2013-11-28 NOTE — Telephone Encounter (Signed)
Spoke with patient, patient aware she is not due for rx until 12/09/2013. Patient aware I will call in rx on 12/08/2013 (afternoon)

## 2013-12-06 ENCOUNTER — Ambulatory Visit (HOSPITAL_COMMUNITY): Admission: RE | Admit: 2013-12-06 | Payer: BC Managed Care – PPO | Source: Ambulatory Visit

## 2013-12-06 ENCOUNTER — Ambulatory Visit (HOSPITAL_COMMUNITY): Payer: BC Managed Care – PPO

## 2013-12-12 ENCOUNTER — Telehealth: Payer: Self-pay

## 2013-12-12 MED ORDER — ALPRAZOLAM 1 MG PO TABS: ORAL_TABLET | ORAL | Status: DC

## 2013-12-12 NOTE — Telephone Encounter (Signed)
Called in RX 

## 2013-12-14 ENCOUNTER — Ambulatory Visit (HOSPITAL_COMMUNITY): Payer: BC Managed Care – PPO

## 2013-12-14 ENCOUNTER — Ambulatory Visit (HOSPITAL_COMMUNITY): Admission: RE | Admit: 2013-12-14 | Payer: BC Managed Care – PPO | Source: Ambulatory Visit

## 2013-12-18 ENCOUNTER — Other Ambulatory Visit: Payer: Self-pay | Admitting: Internal Medicine

## 2014-01-09 ENCOUNTER — Encounter: Payer: BC Managed Care – PPO | Admitting: Internal Medicine

## 2014-01-10 ENCOUNTER — Other Ambulatory Visit: Payer: Self-pay | Admitting: *Deleted

## 2014-01-10 MED ORDER — ALPRAZOLAM 1 MG PO TABS: ORAL_TABLET | ORAL | Status: DC

## 2014-01-10 NOTE — Telephone Encounter (Signed)
Patient Requested and will pick up 

## 2014-02-06 ENCOUNTER — Other Ambulatory Visit: Payer: Self-pay | Admitting: *Deleted

## 2014-02-06 MED ORDER — ALPRAZOLAM 1 MG PO TABS: ORAL_TABLET | ORAL | Status: DC

## 2014-02-06 NOTE — Telephone Encounter (Signed)
Patient requested and will pick up 

## 2014-02-10 ENCOUNTER — Ambulatory Visit: Payer: Medicare Other | Admitting: Internal Medicine

## 2014-03-03 ENCOUNTER — Other Ambulatory Visit: Payer: Self-pay

## 2014-03-03 ENCOUNTER — Ambulatory Visit: Payer: Medicare Other | Admitting: Internal Medicine

## 2014-03-03 MED ORDER — ALPRAZOLAM 1 MG PO TABS: ORAL_TABLET | ORAL | Status: DC

## 2014-03-03 MED ORDER — LEVOTHYROXINE SODIUM 150 MCG PO TABS: ORAL_TABLET | ORAL | Status: DC

## 2014-03-03 NOTE — Telephone Encounter (Signed)
Patient called requesting refill for Alprazolam to be called in. Patient does not have transportation to pick-up rx. Patient also requested that thyroid medication be called in for 90 day supply vs 30.  Both called in to pharmacy on file

## 2014-03-15 ENCOUNTER — Encounter: Payer: Self-pay | Admitting: Internal Medicine

## 2014-03-28 ENCOUNTER — Other Ambulatory Visit: Payer: Self-pay | Admitting: *Deleted

## 2014-03-28 MED ORDER — ALPRAZOLAM 1 MG PO TABS: ORAL_TABLET | ORAL | Status: DC

## 2014-03-28 NOTE — Telephone Encounter (Signed)
Rx Phoned into pharmacy. Patient Requested.

## 2014-04-27 ENCOUNTER — Other Ambulatory Visit: Payer: Self-pay | Admitting: *Deleted

## 2014-04-27 MED ORDER — ALPRAZOLAM 1 MG PO TABS: ORAL_TABLET | ORAL | Status: DC

## 2014-04-27 NOTE — Telephone Encounter (Signed)
Patient called and requested a refill to be phoned to pharmacy. Rx phoned in and spoke with [place].

## 2014-05-01 ENCOUNTER — Encounter: Payer: Self-pay | Admitting: Internal Medicine

## 2014-05-10 ENCOUNTER — Encounter (HOSPITAL_COMMUNITY): Payer: Self-pay

## 2014-05-10 ENCOUNTER — Emergency Department (HOSPITAL_COMMUNITY)
Admission: EM | Admit: 2014-05-10 | Discharge: 2014-05-10 | Disposition: A | Discharge status: Home / Self Care | Payer: BC Managed Care – PPO | Attending: Emergency Medicine | Admitting: Emergency Medicine

## 2014-05-10 ENCOUNTER — Emergency Department (HOSPITAL_COMMUNITY): Payer: BC Managed Care – PPO

## 2014-05-10 DIAGNOSIS — Z86018 Personal history of other benign neoplasm: Secondary | ICD-10-CM | POA: Insufficient documentation

## 2014-05-10 DIAGNOSIS — E119 Type 2 diabetes mellitus without complications: Secondary | ICD-10-CM | POA: Diagnosis not present

## 2014-05-10 DIAGNOSIS — M79602 Pain in left arm: Secondary | ICD-10-CM | POA: Insufficient documentation

## 2014-05-10 DIAGNOSIS — Z791 Long term (current) use of non-steroidal anti-inflammatories (NSAID): Secondary | ICD-10-CM | POA: Insufficient documentation

## 2014-05-10 DIAGNOSIS — R079 Chest pain, unspecified: Secondary | ICD-10-CM | POA: Insufficient documentation

## 2014-05-10 DIAGNOSIS — Z872 Personal history of diseases of the skin and subcutaneous tissue: Secondary | ICD-10-CM | POA: Diagnosis not present

## 2014-05-10 DIAGNOSIS — E039 Hypothyroidism, unspecified: Secondary | ICD-10-CM | POA: Insufficient documentation

## 2014-05-10 DIAGNOSIS — F419 Anxiety disorder, unspecified: Secondary | ICD-10-CM | POA: Insufficient documentation

## 2014-05-10 DIAGNOSIS — I10 Essential (primary) hypertension: Secondary | ICD-10-CM | POA: Diagnosis not present

## 2014-05-10 DIAGNOSIS — D649 Anemia, unspecified: Secondary | ICD-10-CM | POA: Diagnosis not present

## 2014-05-10 DIAGNOSIS — F209 Schizophrenia, unspecified: Secondary | ICD-10-CM | POA: Diagnosis not present

## 2014-05-10 DIAGNOSIS — Z720 Tobacco use: Secondary | ICD-10-CM | POA: Insufficient documentation

## 2014-05-10 DIAGNOSIS — Z79899 Other long term (current) drug therapy: Secondary | ICD-10-CM | POA: Insufficient documentation

## 2014-05-10 DIAGNOSIS — F319 Bipolar disorder, unspecified: Secondary | ICD-10-CM | POA: Diagnosis not present

## 2014-05-10 DIAGNOSIS — E780 Pure hypercholesterolemia: Secondary | ICD-10-CM | POA: Insufficient documentation

## 2014-05-10 LAB — BASIC METABOLIC PANEL
Anion gap: 8 (ref 5–15)
BUN: 12 mg/dL (ref 6–23)
CO2: 23 mmol/L (ref 19–32)
Calcium: 8.8 mg/dL (ref 8.4–10.5)
Chloride: 107 mmol/L (ref 96–112)
Creatinine, Ser: 1 mg/dL (ref 0.50–1.10)
GFR calc Af Amer: 73 mL/min — ABNORMAL LOW (ref >90)
GFR, EST NON AFRICAN AMERICAN: 63 mL/min — AB (ref >90)
GLUCOSE: 109 mg/dL — AB (ref 70–99)
Potassium: 4 mmol/L (ref 3.5–5.1)
Sodium: 138 mmol/L (ref 135–145)

## 2014-05-10 LAB — I-STAT TROPONIN, ED
TROPONIN I, POC: 0 ng/mL (ref 0.00–0.08)
Troponin i, poc: 0 ng/mL (ref 0.00–0.08)

## 2014-05-10 LAB — CBC
HCT: 36.2 % (ref 36.0–46.0)
Hemoglobin: 11.8 g/dL — ABNORMAL LOW (ref 12.0–15.0)
MCH: 27.6 pg (ref 26.0–34.0)
MCHC: 32.6 g/dL (ref 30.0–36.0)
MCV: 84.6 fL (ref 78.0–100.0)
Platelets: 346 10*3/uL (ref 150–400)
RBC: 4.28 MIL/uL (ref 3.87–5.11)
RDW: 17.1 % — ABNORMAL HIGH (ref 11.5–15.5)
WBC: 6.7 10*3/uL (ref 4.0–10.5)

## 2014-05-10 MED ORDER — IBUPROFEN 800 MG PO TABS
800.0000 mg | ORAL_TABLET | Freq: Once | ORAL | Status: AC
  Administered 2014-05-10: 800 mg via ORAL
  Filled 2014-05-10: qty 1

## 2014-05-10 MED ORDER — ASPIRIN 81 MG PO CHEW
324.0000 mg | CHEWABLE_TABLET | Freq: Once | ORAL | Status: AC
  Administered 2014-05-10: 324 mg via ORAL
  Filled 2014-05-10: qty 4

## 2014-05-10 NOTE — ED Notes (Signed)
She states she arose and took a shower, which is her normal routine.  She then went to work as usual; and shortly after her arrival at work she experienced an intense "aching" in her left arm which lasted ~5-6 min.  During this episode she noticed some chest discomfort (not nearly as intense as in her left arm); and denies ever experiencing shortness of breath or diaphoresis.  Her skin is normal, warm and dry and she is breathing normally.

## 2014-05-10 NOTE — Discharge Instructions (Signed)
Chest Pain (Nonspecific) Follow up with your primary care provider.  Get back on your hypertension and high cholesterol medications. Take tylenol or motrin for pain.  Return for new or worsening chest pain or shortness of breath. Take an 81 mg aspirin daily.  It is often hard to give a specific diagnosis for the cause of chest pain. There is always a chance that your pain could be related to something serious, such as a heart attack or a blood clot in the lungs. You need to follow up with your health care provider for further evaluation. CAUSES   Heartburn.  Pneumonia or bronchitis.  Anxiety or stress.  Inflammation around your heart (pericarditis) or lung (pleuritis or pleurisy).  A blood clot in the lung.  A collapsed lung (pneumothorax). It can develop suddenly on its own (spontaneous pneumothorax) or from trauma to the chest.  Shingles infection (herpes zoster virus). The chest wall is composed of bones, muscles, and cartilage. Any of these can be the source of the pain.  The bones can be bruised by injury.  The muscles or cartilage can be strained by coughing or overwork.  The cartilage can be affected by inflammation and become sore (costochondritis). DIAGNOSIS  Lab tests or other studies may be needed to find the cause of your pain. Your health care provider may have you take a test called an ambulatory electrocardiogram (ECG). An ECG records your heartbeat patterns over a 24-hour period. You may also have other tests, such as:  Transthoracic echocardiogram (TTE). During echocardiography, sound waves are used to evaluate how blood flows through your heart.  Transesophageal echocardiogram (TEE).  Cardiac monitoring. This allows your health care provider to monitor your heart rate and rhythm in real time.  Holter monitor. This is a portable device that records your heartbeat and can help diagnose heart arrhythmias. It allows your health care provider to track your heart  activity for several days, if needed.  Stress tests by exercise or by giving medicine that makes the heart beat faster. TREATMENT   Treatment depends on what may be causing your chest pain. Treatment may include:  Acid blockers for heartburn.  Anti-inflammatory medicine.  Pain medicine for inflammatory conditions.  Antibiotics if an infection is present.  You may be advised to change lifestyle habits. This includes stopping smoking and avoiding alcohol, caffeine, and chocolate.  You may be advised to keep your head raised (elevated) when sleeping. This reduces the chance of acid going backward from your stomach into your esophagus. Most of the time, nonspecific chest pain will improve within 2-3 days with rest and mild pain medicine.  HOME CARE INSTRUCTIONS   If antibiotics were prescribed, take them as directed. Finish them even if you start to feel better.  For the next few days, avoid physical activities that bring on chest pain. Continue physical activities as directed.  Do not use any tobacco products, including cigarettes, chewing tobacco, or electronic cigarettes.  Avoid drinking alcohol.  Only take medicine as directed by your health care provider.  Follow your health care provider's suggestions for further testing if your chest pain does not go away.  Keep any follow-up appointments you made. If you do not go to an appointment, you could develop lasting (chronic) problems with pain. If there is any problem keeping an appointment, call to reschedule. SEEK MEDICAL CARE IF:   Your chest pain does not go away, even after treatment.  You have a rash with blisters on your chest.  You  have a fever. SEEK IMMEDIATE MEDICAL CARE IF:   You have increased chest pain or pain that spreads to your arm, neck, jaw, back, or abdomen.  You have shortness of breath.  You have an increasing cough, or you cough up blood.  You have severe back or abdominal pain.  You feel  nauseous or vomit.  You have severe weakness.  You faint.  You have chills. This is an emergency. Do not wait to see if the pain will go away. Get medical help at once. Call your local emergency services (911 in [place] Do not drive yourself to the hospital. MAKE SURE YOU:   Understand these instructions.  Will watch your condition.  Will get help right away if you are not doing well or get worse. Document Released: 10/16/2004 Document Revised: 01/11/2013 Document Reviewed: 08/12/2007 [facility] Patient Information 2015 [place], [place]. This information is not intended to replace advice given to you by your health care provider. Make sure you discuss any questions you have with your health care provider.

## 2014-05-10 NOTE — ED Provider Notes (Signed)
CSN: [id number]     Arrival date & time 05/10/14  7209 History   First MD Initiated Contact with Patient 05/10/14 0930     Chief Complaint  Patient presents with  . Chest Pain     (Consider location/radiation/quality/duration/timing/severity/associated sxs/prior Treatment) Patient is a 54 y.o. female presenting with chest pain. The history is provided by the patient. No language interpreter was used.  Chest Pain Mr. Garretson is a 54 y.o female with a history of HTN, hyperlipidemia, and thyroid disease who presents for new onset left arm pain that began 2 hours ago followed by chest pain and shortness of breath that lasted a few minutes.  She states the Chest pain is now 2/10 and feels achy. She has also been noncompliant with her medications due to stress.  She states she had to do CPR on her husband 5 days ago and he is now in critical condition at [facility] with kidney failure.  She denies any fever, palpitations, abdominal pain, nausea, vomiting, diarrhea, urinary symptoms, leg swelling. She is a 1ppd smoker.  Her mother had an MI before the age of 64.   Past Medical History  Diagnosis Date  . Thyroid disease   . Hypercholesteremia   . Anxiety   . Lumbago   . Hypertension   . Enlargement of lymph nodes   . Benign neoplasm of lymph nodes   . Other atopic dermatitis and related conditions   . Other abnormal blood chemistry   . Unspecified schizophrenia, unspecified condition   . Bipolar I disorder, most recent episode (or current) unspecified   . Depression   . Myalgia and myositis, unspecified   . Hypothyroidism   . Diabetes mellitus type 2, controlled    Past Surgical History  Procedure Laterality Date  . Abdominal hysterectomy    . Thyroidectomy    . Appendectomy    . Eye surgery Right     september   No family history on file. History  Substance Use Topics  . Smoking status: Current Every Day Smoker    Types: Cigarettes  . Smokeless tobacco: Not on file  . Alcohol Use:  No   OB History    No data available     Review of Systems  Cardiovascular: Positive for chest pain.  All other systems reviewed and are negative.     Allergies  Citalopram  Home Medications   Prior to Admission medications   Medication Sig Start Date End Date Taking? Authorizing Provider  acetaminophen (TYLENOL) 500 MG tablet Take 500 mg by mouth every 6 (six) hours as needed for moderate pain.    Yes Historical Provider, MD  ALPRAZolam Duanne Moron) 1 MG tablet take 1 tablet by mouth three times a day if needed Patient taking differently: Take 1 mg by mouth 2 (two) times daily.  04/27/14  Yes Tiffany L Reed, DO  amLODipine (NORVASC) 10 MG tablet Take 0.5 tablets (5 mg total) by mouth daily. 07/13/13  Yes Mahima Bubba Camp, MD  levothyroxine (SYNTHROID, LEVOTHROID) 150 MCG tablet take 1 tablet by mouth once daily BEFORE BREAKFAST Patient taking differently: Take 150 mcg by mouth daily before breakfast.  03/03/14  Yes Mahima Pandey, MD  pravastatin (PRAVACHOL) 80 MG tablet Take 1 tablet (80 mg total) by mouth daily. 12/28/12  Yes Mahima Pandey, MD  triamcinolone cream (KENALOG) 0.5 % Apply 1 application topically 2 (two) times daily as needed (for ezcuma).  04/08/14  Yes Historical Provider, MD  gabapentin (NEURONTIN) 100 MG capsule Take 1 capsule (  100 mg total) by mouth at bedtime. Patient not taking: Reported on 05/10/2014 11/08/13   Blanchie Serve, MD  naproxen (NAPROSYN) 500 MG tablet Take 1 tablet (500 mg total) by mouth 2 (two) times daily with a meal. Patient not taking: Reported on 05/10/2014 02/01/13   Blanchie Serve, MD  QUEtiapine (SEROQUEL) 50 MG tablet Take 1 tablet (50 mg total) by mouth at bedtime. Patient not taking: Reported on 05/10/2014 11/04/12   Lauree Chandler, NP   BP 151/81 mmHg  Pulse 74  Temp(Src) 98 F (36.7 C) (Oral)  Resp 15  SpO2 97% Physical Exam  Constitutional: She is oriented to person, place, and time. She appears well-developed and well-nourished.  Tearful  about being stressed and her husband in critical condition at [facility].  HENT:  Head: Normocephalic and atraumatic.  Eyes: Conjunctivae are normal.  Neck: Normal range of motion. Neck supple.  Cardiovascular: Normal rate, regular rhythm and normal heart sounds.   Pulmonary/Chest: Effort normal and breath sounds normal. No respiratory distress. She has no wheezes. She has no rales. She exhibits no tenderness.  Abdominal: Soft. There is no tenderness.  Musculoskeletal: Normal range of motion. She exhibits no edema.  Neurological: She is alert and oriented to person, place, and time.  Skin: Skin is warm and dry.  Nursing note and vitals reviewed.   ED Course  Procedures (including critical care time) Labs Review Labs Reviewed  CBC - Abnormal; Notable for the following:    Hemoglobin 11.8 (*)    RDW 17.1 (*)    All other components within normal limits  BASIC METABOLIC PANEL - Abnormal; Notable for the following:    Glucose, Bld 109 (*)    GFR calc non Af Amer 63 (*)    GFR calc Af Amer 73 (*)    All other components within normal limits  I-STAT TROPOININ, ED  I-STAT TROPOININ, ED    Imaging Review Dg Chest 2 View  05/10/2014   CLINICAL DATA:  Chest pain  EXAM: CHEST  2 VIEW  COMPARISON:  11/18/2011  FINDINGS: Heart size is upper normal. Negative for heart failure or pneumonia. Lungs are clear. Linear scarring in the left lung base. Focal eventration posterior left hemidiaphragm is unchanged. No change from the prior study.  IMPRESSION: No active cardiopulmonary disease.   Electronically Signed   By: Franchot Gallo M.D.   On: 05/10/2014 11:09     EKG Interpretation   Date/Time:  Wednesday May 10 2014 09:06:45 EDT Ventricular Rate:  82 PR Interval:  169 QRS Duration: 80 QT Interval:  378 QTC Calculation: 441 R Axis:   13 Text Interpretation:  Sinus rhythm Normal ECG No significant change since  last tracing Confirmed by Christy Gentles  MD, DONALD ([id number]) on 05/10/2014  9:32:29 AM       MDM   Final diagnoses:  Chest pain, unspecified chest pain type   Patient presents for chest pain and left arm pain that began 2 hours ago.  Her exam is normal.  Her labs show slight anemia. Negative troponin.  Normal EKG. CXR negative for pneumonia or edema.  I will get repeat troponin.  I reviewed the HEART score and she is at low risk. I have given her Aspirin in the ED. I do not suspect a PE. She is not tachycardic or hypoxic.  She is not on estrogen.  No recent travel or surgery. No previous DVT or PE. She is slightly anemic which she is aware of but does not take  iron supplements.  She is non compliant with all medications. I told her to follow up with her pcp and resume her medications as prescribed by her pcp.      Ottie Glazier, PA-C 05/10/14 [address], PA-C 05/11/14 1852  Ripley Fraise, MD 05/12/14 [phone]

## 2014-05-12 ENCOUNTER — Ambulatory Visit: Payer: Medicare Other | Admitting: Internal Medicine

## 2014-05-15 ENCOUNTER — Encounter: Payer: Self-pay | Admitting: Internal Medicine

## 2014-05-19 ENCOUNTER — Telehealth: Payer: Self-pay | Admitting: *Deleted

## 2014-05-19 NOTE — Telephone Encounter (Signed)
Patient called and stated that she needs a refill on her Alprazolam early. Stated that is has been a rough month. Last Refill was 04/27/2014. Please Advise.

## 2014-05-19 NOTE — Telephone Encounter (Signed)
Patient Notified had to leave message on her voicemail that her Rx will be ready next Friday.

## 2014-05-19 NOTE — Telephone Encounter (Signed)
It appears to me that this cannot be filled for another week.  If she is taking more medication than ordered, this can be dangerous.

## 2014-05-26 ENCOUNTER — Encounter: Payer: Self-pay | Admitting: Internal Medicine

## 2014-05-26 ENCOUNTER — Other Ambulatory Visit: Payer: Self-pay | Admitting: *Deleted

## 2014-05-26 ENCOUNTER — Ambulatory Visit: Payer: Medicare Other | Admitting: Internal Medicine

## 2014-05-26 DIAGNOSIS — Z0289 Encounter for other administrative examinations: Secondary | ICD-10-CM

## 2014-05-26 MED ORDER — ALPRAZOLAM 1 MG PO TABS: ORAL_TABLET | ORAL | Status: DC

## 2014-05-26 NOTE — Telephone Encounter (Signed)
Patient missed appointment today, per Dr.Reed discard rx. Patient aware we will not fax rx due to missed appointment. Rescheduled appointment to 06/05/14

## 2014-05-26 NOTE — Telephone Encounter (Signed)
Patient Requested and faxed to pharmacy. 

## 2014-06-05 ENCOUNTER — Ambulatory Visit: Payer: Self-pay | Admitting: Internal Medicine

## 2014-06-15 ENCOUNTER — Ambulatory Visit (INDEPENDENT_AMBULATORY_CARE_PROVIDER_SITE_OTHER): Payer: BC Managed Care – PPO | Admitting: Internal Medicine

## 2014-06-15 ENCOUNTER — Encounter: Payer: Self-pay | Admitting: Internal Medicine

## 2014-06-15 VITALS — BP 148/82 | HR 77 | Temp 97.3°F | Resp 18 | Ht 65.0 in | Wt 177.4 lb

## 2014-06-15 DIAGNOSIS — E039 Hypothyroidism, unspecified: Secondary | ICD-10-CM | POA: Diagnosis not present

## 2014-06-15 DIAGNOSIS — M79672 Pain in left foot: Secondary | ICD-10-CM | POA: Diagnosis not present

## 2014-06-15 DIAGNOSIS — G629 Polyneuropathy, unspecified: Secondary | ICD-10-CM

## 2014-06-15 DIAGNOSIS — Z720 Tobacco use: Secondary | ICD-10-CM

## 2014-06-15 DIAGNOSIS — L309 Dermatitis, unspecified: Secondary | ICD-10-CM

## 2014-06-15 DIAGNOSIS — H3321 Serous retinal detachment, right eye: Secondary | ICD-10-CM

## 2014-06-15 DIAGNOSIS — E1142 Type 2 diabetes mellitus with diabetic polyneuropathy: Secondary | ICD-10-CM | POA: Diagnosis not present

## 2014-06-15 DIAGNOSIS — H3570 Unspecified separation of retinal layers: Secondary | ICD-10-CM

## 2014-06-15 DIAGNOSIS — I10 Essential (primary) hypertension: Secondary | ICD-10-CM | POA: Diagnosis not present

## 2014-06-15 MED ORDER — ALPRAZOLAM 1 MG PO TABS: ORAL_TABLET | ORAL | Status: DC

## 2014-06-15 MED ORDER — LISINOPRIL 5 MG PO TABS: 5.0000 mg | ORAL_TABLET | Freq: Every day | ORAL | Status: DC

## 2014-06-15 MED ORDER — HYDROCORTISONE 2.5 % EX CREA: TOPICAL_CREAM | Freq: Two times a day (BID) | CUTANEOUS | Status: DC

## 2014-06-15 NOTE — Progress Notes (Signed)
Patient ID: Toni Moses, female   DOB: 1960/09/19, 54 y.o.   MRN: [id number]   Location:  [facility]  Goals of Care: Advanced Directive information Does patient have an advance directive?: No, Would patient like information on creating an advanced directive?: Yes - Educational materials given  Chief Complaint  Patient presents with  . Medical Management of Chronic Issues    Patient c/o says foot pain, eczmema    HPI: Patient is a 54 y.o. female seen in the office today for med mgt of chronic diseases.  She was previously followed by Dr. Bubba Camp, but she is no longer in the office so she has switched over to me.    Eczema is driving her crazy.  Is itchy all over.  Dr. Bubba Camp previously gave her kenalog cream but it was not sufficient.    Left foot nagging pain.  Took tylenol for pain.  Sometimes will shoot up the leg to the knee and knee will want to give out.   Not necessarily worse when walking--seems like it bothers her when she goes to get up   [facility] and depth perception off--trip over husband's wheelchair.  Went down on knees.  Doesn't remember specific injury to the left medial ankle.  Exquisitely tender and swollen in that area.  Had to hang foot off bed and swing it to make it feel better.  Did not take bp medication this am.  Remembered thyroid pill, but forgot the bp med right next to it.    Does have to hurry to the restroom so she does not have incontinence.    Had retinal detachment of right eye happened last March/April.  Vision was going bad and was getting shots in her eyes.    Goes next in October.  Sees Dr. Zadie Rhine.  Saw Dr. Baird Cancer as second opinion.  Still smoking.  Less than a ppd.    Diet healthy.  Avoids starchy food.  Eats a lot of veggies.  Does not exercise.  Review of Systems:  Review of Systems  Constitutional: Negative for fever and chills.       Frustrated with weight  Eyes:       Legally blind    Respiratory: Negative for shortness of breath.   Cardiovascular: Negative for chest pain.  Gastrointestinal: Negative for abdominal pain.  Genitourinary: Positive for urgency. Negative for dysuria and frequency.  Musculoskeletal: Positive for joint pain and falls.       Left ankle/foot  Skin: Positive for itching and rash.  Neurological: Positive for sensory change. Negative for dizziness and loss of consciousness.  Psychiatric/Behavioral: Positive for depression. The patient is nervous/anxious.     Past Medical History  Diagnosis Date  . Thyroid disease   . Hypercholesteremia   . Anxiety   . Lumbago   . Hypertension   . Enlargement of lymph nodes   . Benign neoplasm of lymph nodes   . Other atopic dermatitis and related conditions   . Other abnormal blood chemistry   . Unspecified schizophrenia, unspecified condition   . Bipolar I disorder, most recent episode (or current) unspecified   . Depression   . Myalgia and myositis, unspecified   . Hypothyroidism   . Diabetes mellitus type 2, controlled     Past Surgical History  Procedure Laterality Date  . Abdominal hysterectomy    . Thyroidectomy    . Appendectomy    . Eye surgery Right     september  Allergies  Allergen Reactions  . Citalopram Other (See Comments)    "made her feel bad"   Medications: Patient's Medications  New Prescriptions   HYDROCORTISONE 2.5 % CREAM    Apply topically 2 (two) times daily.   LISINOPRIL (PRINIVIL,ZESTRIL) 5 MG TABLET    Take 1 tablet (5 mg total) by mouth daily.  Previous Medications   ACETAMINOPHEN (TYLENOL) 500 MG TABLET    Take 500 mg by mouth every 6 (six) hours as needed for moderate pain.    ALPRAZOLAM (XANAX) 1 MG TABLET    take 1 tablet by mouth three times a day if needed   GABAPENTIN (NEURONTIN) 100 MG CAPSULE    Take 1 capsule (100 mg total) by mouth at bedtime.   LEVOTHYROXINE (SYNTHROID, LEVOTHROID) 150 MCG TABLET    take 1 tablet by mouth once daily BEFORE  BREAKFAST   NAPROXEN (NAPROSYN) 500 MG TABLET    Take 1 tablet (500 mg total) by mouth 2 (two) times daily with a meal.   PRAVASTATIN (PRAVACHOL) 80 MG TABLET    Take 1 tablet (80 mg total) by mouth daily.   QUETIAPINE (SEROQUEL) 50 MG TABLET    Take 1 tablet (50 mg total) by mouth at bedtime.   TRIAMCINOLONE CREAM (KENALOG) 0.5 %    Apply 1 application topically 2 (two) times daily as needed (for ezcuma).   Modified Medications   No medications on file  Discontinued Medications   AMLODIPINE (NORVASC) 10 MG TABLET    Take 0.5 tablets (5 mg total) by mouth daily.    Physical Exam: Filed Vitals:   06/15/14 1519  BP: 148/82  Pulse: 77  Temp: 97.3 F (36.3 C)  TempSrc: Oral  Resp: 18  Height: 5\' 5"  (1.651 m)  Weight: 177 lb 6.4 oz (80.468 kg)  SpO2: 99%   Physical Exam  Constitutional: She is oriented to person, place, and time. She appears well-developed and well-nourished. No distress.  Eyes:  Redness of left eye  Cardiovascular: Normal rate, regular rhythm, normal heart sounds and intact distal pulses.   Pulmonary/Chest: Effort normal and breath sounds normal. No respiratory distress.  Abdominal: Soft. Bowel sounds are normal.  Musculoskeletal: Normal range of motion. She exhibits tenderness.  Of medial-dorsal left foot tender to touch  Neurological: She is alert and oriented to person, place, and time.  Skin: Skin is warm and dry. Rash noted.    Labs reviewed: Basic Metabolic Panel:  Recent Labs  07/13/13 1158 11/08/13 1122 05/10/14 0928  NA 139 142 138  K 4.1 4.1 4.0  CL 103 104 107  CO2 21 23 23   GLUCOSE 87 91 109*  BUN 11 10 12   CREATININE 0.99 0.85 1.00  CALCIUM 9.4 9.2 8.8  TSH 81.670* 1.180  --    Liver Function Tests:  Recent Labs  07/13/13 1158 11/08/13 1122  AST 22 12  ALT 9 8  ALKPHOS 81 97  BILITOT 0.2 0.4  PROT 7.6 7.2   No results for input(s): LIPASE, AMYLASE in the last 8760 hours. No results for input(s): AMMONIA in the last 8760  hours. CBC:  Recent Labs  07/13/13 1158 11/08/13 1122 05/10/14 0928  WBC 4.8 4.3 6.7  NEUTROABS 2.9 2.8  --   HGB 12.0 11.4 11.8*  HCT 37.3 33.9* 36.2  MCV 81 83 84.6  PLT  --   --  346   Lipid Panel:  Recent Labs  07/13/13 1158 11/08/13 1122  CHOL 280* 219*  HDL 84 68  LDLCALC 159* 129*  TRIG 184* 112  CHOLHDL 3.3 3.2   Lab Results  Component Value Date   HGBA1C 6.2* 11/08/2013   Assessment/Plan 1. Eczema -will prescribe short term high strength steroid cream, but advised that she should be able to decrease the use of this after 1-2 weeks and maintain moisture with eucerin or gold bond unscented - hydrocortisone 2.5 % cream; Apply topically 2 (two) times daily.  Dispense: 453.6 g; Refill: 3  2. Essential hypertension -bp not controlled with her current amlodipine but this is b/c she's not taking it properly -she says she's had a side effect -will change to lisinopril 5mg  daily and monitor--check labs before next appt  3. Right retinal detachment -follows with Dr. 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 DM type 2 with diabetic peripheral neuropathy - f/u labs today and again before routine visit in 3 mos - lisinopril (PRINIVIL,ZESTRIL) 5 MG tablet; Take 1 tablet (5 mg total) by mouth daily.  Dispense: 30 tablet; Refill: 3 - Hemoglobin A1c - Lipid panel; Future - Hemoglobin A1c; Future - Basic metabolic panel; Future  5. Hypothyroidism, unspecified hypothyroidism type -continue synthroid - TSH  6. Tobacco use -ongoing, not ready to quit at present  7. Left foot pain - suspect tendon or ligamental injury from fall over her husband's wheelchair -r/o fractures with xrays - DG Foot Complete Left; Future - DG Ankle Complete Left; Future  Labs/tests ordered:   Orders Placed This Encounter    Procedures  . DG Foot Complete Left    Standing Status: Future     Number of Occurrences:      Standing Expiration Date: 08/15/2015    Order Specific Question:  Reason for Exam (SYMPTOM  OR DIAGNOSIS REQUIRED)    Answer:  left medial foot pain, s/p fall    Order Specific Question:  Is the patient pregnant?    Answer:  No    Order Specific Question:  Preferred imaging location?    Answer:  [facility]  . DG Ankle Complete Left    Standing Status: Future     Number of Occurrences:      Standing Expiration Date: 08/15/2015    Order Specific Question:  Reason for Exam (SYMPTOM  OR DIAGNOSIS REQUIRED)    Answer:  left medial foot pain near ankle, swelling, s/p fall    Order Specific Question:  Is the patient pregnant?    Answer:  No    Order Specific Question:  Preferred imaging location?    Answer:  [facility]  . Hemoglobin A1c  . TSH  . Lipid panel    Standing Status: Future     Number of Occurrences:      Standing Expiration Date: 12/16/2014    Order Specific Question:  Has the patient fasted?    Answer:  Yes  . Hemoglobin A1c    Standing Status: Future     Number of Occurrences:      Standing Expiration Date: 12/16/2014  . Basic metabolic panel    Standing Status: Future     Number of Occurrences:      Standing Expiration Date: 12/16/2014    Order Specific Question:  Has the patient fasted?    Answer:  Yes    Next appt:  3 mos with labs before  [place]  Lynelle Doctor, D.O. [facility] [address],  [address] Cell Phone (Mon-Fri 8am-5pm):  [phone] On Call:  [phone] & follow prompts after 5pm & weekends Office Phone:  [phone] Office Fax:  [phone]

## 2014-06-17 LAB — HEMOGLOBIN A1C
Est. average glucose Bld gHb Est-mCnc: 131 mg/dL
Hgb A1c MFr Bld: 6.2 % — ABNORMAL HIGH (ref 4.8–5.6)

## 2014-06-17 LAB — TSH: TSH: 13.64 u[IU]/mL — ABNORMAL HIGH (ref 0.450–4.500)

## 2014-07-11 ENCOUNTER — Telehealth: Payer: Self-pay | Admitting: *Deleted

## 2014-07-11 NOTE — Telephone Encounter (Signed)
I recommend she see the dermatologist if she is not getting better. Prednisone has too many side effects to give for a long-term problem like eczema especially with her diabetes.  It will run up her sugars and then she'll have more problems.  If she agrees to a derm referral, I will sign the order.

## 2014-07-11 NOTE — Telephone Encounter (Signed)
Spoke with patient regarding her itching, informed her that Dr. Mariea Clonts thought that her next course of action should be that she go see a dermatologist. I had to explain to her that the prednisone might give her issues with her diabetes. She stated that she would call me back when she returned home.

## 2014-07-11 NOTE — Telephone Encounter (Signed)
Patient called requesting something stronger for her itching,she stated that the kenalog cream for her eczema is not working.  She suggested if it was possible to get some prednisone, I informed her that I would have to send you a message. Please Advise!

## 2014-07-18 ENCOUNTER — Other Ambulatory Visit: Payer: Self-pay

## 2014-07-18 MED ORDER — ALPRAZOLAM 1 MG PO TABS: ORAL_TABLET | ORAL | Status: DC

## 2014-08-10 ENCOUNTER — Telehealth: Payer: Self-pay | Admitting: Internal Medicine

## 2014-08-10 NOTE — Telephone Encounter (Signed)
Noted.  We will discuss at her next appointment.

## 2014-08-10 NOTE — Telephone Encounter (Signed)
A mammogram, Bone Density & X-ray of foot & ankle were ordered for patient by Dr. Bubba Camp  After several attempts to schedule patient has declined having any of the tests done    CC'd Rodena Piety for data entry

## 2014-08-11 NOTE — Telephone Encounter (Signed)
Added to Health Maintenance

## 2014-08-16 ENCOUNTER — Other Ambulatory Visit: Payer: Self-pay

## 2014-08-16 MED ORDER — ALPRAZOLAM 1 MG PO TABS: ORAL_TABLET | ORAL | Status: DC

## 2014-09-06 ENCOUNTER — Telehealth: Payer: Self-pay | Admitting: *Deleted

## 2014-09-06 DIAGNOSIS — L299 Pruritus, unspecified: Secondary | ICD-10-CM

## 2014-09-06 NOTE — Telephone Encounter (Signed)
Received a call from Toni Moses regarding her referral for a dermatologist, she stated that she had seen one some years ago but does not remember his name. She stated that it would be ok to set it up with another one in the [facility] system.

## 2014-09-14 ENCOUNTER — Other Ambulatory Visit: Payer: BC Managed Care – PPO

## 2014-09-18 ENCOUNTER — Ambulatory Visit: Payer: BC Managed Care – PPO | Admitting: Internal Medicine

## 2014-10-03 ENCOUNTER — Telehealth: Payer: Self-pay | Admitting: *Deleted

## 2014-10-03 NOTE — Telephone Encounter (Signed)
Patient called and requested refill on her Alprazolam. Patient has "no showed" her appointments. Patient needs an appointment before refills can be given. I spoke with patient and informed her of this and she stated she will make an appointment and stated that she would make one for herself and husband. While trying to get Providers schedule pulled up we were disconnected, tried calling back and rang busy.

## 2014-10-05 ENCOUNTER — Ambulatory Visit (INDEPENDENT_AMBULATORY_CARE_PROVIDER_SITE_OTHER): Payer: BC Managed Care – PPO | Admitting: Nurse Practitioner

## 2014-10-05 ENCOUNTER — Ambulatory Visit (INDEPENDENT_AMBULATORY_CARE_PROVIDER_SITE_OTHER): Payer: BC Managed Care – PPO | Admitting: Internal Medicine

## 2014-10-05 ENCOUNTER — Encounter: Payer: Self-pay | Admitting: Internal Medicine

## 2014-10-05 VITALS — BP 140/82 | HR 68 | Temp 98.1°F | Resp 14 | Ht 64.08 in | Wt 170.0 lb

## 2014-10-05 DIAGNOSIS — H548 Legal blindness, as defined in USA: Secondary | ICD-10-CM | POA: Diagnosis not present

## 2014-10-05 DIAGNOSIS — H3570 Unspecified separation of retinal layers: Secondary | ICD-10-CM

## 2014-10-05 DIAGNOSIS — Z636 Dependent relative needing care at home: Secondary | ICD-10-CM

## 2014-10-05 DIAGNOSIS — H3321 Serous retinal detachment, right eye: Secondary | ICD-10-CM

## 2014-10-05 DIAGNOSIS — I10 Essential (primary) hypertension: Secondary | ICD-10-CM

## 2014-10-05 NOTE — Progress Notes (Signed)
Patient ID: Toni Moses, female   DOB: November 10, 1960, 54 y.o.   MRN: [id number]   Location:  [facility]  Goals of Care: Advanced Directive information Does patient have an advance directive?: No, Would patient like information on creating an advanced directive?: Yes - Educational materials given (Patient has on hand from previous visit )   Chief Complaint  Patient presents with  . Follow-up    Follow-up to renew medications   . Eczema    Discuss rx for eczema or steroid injection     HPI: Patient is a 54 y.o. black female seen in the office today for completion of fmla form and wants her xanax and seroquel.  BP was up this morning b/c she was told to come here on the wrong day.  She would not stay for a full exam.      Review of Systems:  Review of Systems  Eyes: Positive for redness.       Legally blind; uses walking stick  Cardiovascular:       Bp elevated today  Psychiatric/Behavioral: Positive for depression.       Caregiver stress with husband    Past Medical History  Diagnosis Date  . Thyroid disease   . Hypercholesteremia   . Anxiety   . Lumbago   . Hypertension   . Enlargement of lymph nodes   . Benign neoplasm of lymph nodes   . Other atopic dermatitis and related conditions   . Other abnormal blood chemistry   . Unspecified schizophrenia, unspecified condition   . Bipolar I disorder, most recent episode (or current) unspecified   . Depression   . Myalgia and myositis, unspecified   . Hypothyroidism   . Diabetes mellitus type 2, controlled     Past Surgical History  Procedure Laterality Date  . Abdominal hysterectomy    . Thyroidectomy    . Appendectomy    . Eye surgery Right     september    Allergies  Allergen Reactions  . Citalopram Other (See Comments)    "made her feel bad"   Medications: Patient's Medications  New Prescriptions   No medications on file  Previous Medications   ACETAMINOPHEN  (TYLENOL) 500 MG TABLET    Take 500 mg by mouth every 6 (six) hours as needed for moderate pain.    ALPRAZOLAM (XANAX) 1 MG TABLET    take 1 tablet by mouth three times a day if needed   GABAPENTIN (NEURONTIN) 100 MG CAPSULE    Take 1 capsule (100 mg total) by mouth at bedtime.   HYDROCORTISONE 2.5 % CREAM    Apply topically 2 (two) times daily.   LEVOTHYROXINE (SYNTHROID, LEVOTHROID) 150 MCG TABLET    take 1 tablet by mouth once daily BEFORE BREAKFAST   LISINOPRIL (PRINIVIL,ZESTRIL) 5 MG TABLET    Take 1 tablet (5 mg total) by mouth daily.   PRAVASTATIN (PRAVACHOL) 80 MG TABLET    Take 1 tablet (80 mg total) by mouth daily.   QUETIAPINE (SEROQUEL) 50 MG TABLET    Take 1 tablet (50 mg total) by mouth at bedtime.   TRIAMCINOLONE CREAM (KENALOG) 0.5 %    Apply 1 application topically 2 (two) times daily as needed (for ezcuma).   Modified Medications   No medications on file  Discontinued Medications   NAPROXEN (NAPROSYN) 500 MG TABLET    Take 1 tablet (500 mg total) by mouth 2 (two) times daily with a meal.  Physical Exam: Filed Vitals:   10/05/14 0920  BP: 140/82  Pulse: 68  Temp: 98.1 F (36.7 C)  TempSrc: Oral  Resp: 14  Height: 5' 4.07" (1.628 m)  Weight: 170 lb (77.111 kg)  SpO2: 96%   Physical Exam  Constitutional: She is oriented to person, place, and time. She appears well-developed and well-nourished.  Agitated and angry  Eyes:  Legally blind and walking with walking stick  Pulmonary/Chest: Effort normal.  Musculoskeletal: Normal range of motion.  Neurological: She is alert and oriented to person, place, and time.  Psychiatric:  Agitated and angry, refuses to stay for exam and for manager to take her to work--insists upon leaving on scat bus b/c she's waited too long (I was doing the extensive FMLA form)    Labs reviewed: Basic Metabolic Panel:  Recent Labs  11/08/13 1122 05/10/14 0928 06/15/14 1616  NA 142 138  --   K 4.1 4.0  --   CL 104 107  --   CO2 23  23  --   GLUCOSE 91 109*  --   BUN 10 12  --   CREATININE 0.85 1.00  --   CALCIUM 9.2 8.8  --   TSH 1.180  --  13.640*   Liver Function Tests:  Recent Labs  11/08/13 1122  AST 12  ALT 8  ALKPHOS 97  BILITOT 0.4  PROT 7.2   No results for input(s): LIPASE, AMYLASE in the last 8760 hours. No results for input(s): AMMONIA in the last 8760 hours. CBC:  Recent Labs  11/08/13 1122 05/10/14 0928  WBC 4.3 6.7  NEUTROABS 2.8  --   HGB 11.4 11.8*  HCT 33.9* 36.2  MCV 83 84.6  PLT  --  346   Lipid Panel:  Recent Labs  11/08/13 1122  CHOL 219*  HDL 68  LDLCALC 129*  TRIG 112  CHOLHDL 3.2   Lab Results  Component Value Date   HGBA1C 6.2* 06/15/2014    Assessment/Plan 1. Essential hypertension -not controlled today due to agitation -cont lisinopril 5mg  daily, monitor bp at home and provide record at next visit  2. Caregiver stress -is severe in combination with her baseline depression and anxiety -is very inpatient -FMLA form was completed today which took more than 15 minutes to complete and vitals were reviewed  3. Right retinal detachment -needs to maintain f/u with ophthalmology  4. Legal blindness -uses scat for transport and walking stick -is primary caregiver of her husband with PAD s/p bka, DMII, CAD and ESRD  Xanax and seroquel Rxs were not provided b/c patient left too quickly to get them.  Next appt:  Has one next week--will see if she comes to it so her chronic conditions can truly be addressed.  Dimples Probus L. Humaira Sculley, D.O. [facility] [address], [place] [address] Cell Phone (Mon-Fri 8am-5pm):  [phone] On Call:  [phone] & follow prompts after 5pm & weekends Office Phone:  [phone] Office Fax:  [phone]

## 2014-10-10 ENCOUNTER — Encounter: Payer: Self-pay | Admitting: Nurse Practitioner

## 2014-10-10 ENCOUNTER — Ambulatory Visit (INDEPENDENT_AMBULATORY_CARE_PROVIDER_SITE_OTHER): Payer: BC Managed Care – PPO | Admitting: Nurse Practitioner

## 2014-10-10 ENCOUNTER — Ambulatory Visit: Payer: BC Managed Care – PPO | Admitting: Nurse Practitioner

## 2014-10-10 VITALS — BP 138/80 | HR 93 | Temp 98.3°F | Resp 16 | Ht 64.05 in | Wt 170.0 lb

## 2014-10-10 DIAGNOSIS — L309 Dermatitis, unspecified: Secondary | ICD-10-CM | POA: Diagnosis not present

## 2014-10-10 DIAGNOSIS — E1142 Type 2 diabetes mellitus with diabetic polyneuropathy: Secondary | ICD-10-CM | POA: Diagnosis not present

## 2014-10-10 DIAGNOSIS — F419 Anxiety disorder, unspecified: Secondary | ICD-10-CM

## 2014-10-10 DIAGNOSIS — E039 Hypothyroidism, unspecified: Secondary | ICD-10-CM

## 2014-10-10 DIAGNOSIS — G629 Polyneuropathy, unspecified: Secondary | ICD-10-CM | POA: Diagnosis not present

## 2014-10-10 MED ORDER — PREDNISONE 5 MG PO TABS: ORAL_TABLET | ORAL | Status: DC

## 2014-10-10 MED ORDER — LISINOPRIL 5 MG PO TABS: 5.0000 mg | ORAL_TABLET | Freq: Every day | ORAL | Status: DC

## 2014-10-10 NOTE — Patient Instructions (Signed)
To take prednisone 5 mg tablet 4 tablets 20 mg for 4 days, then decrease by 5 mg daily until complete--- so decrease to 15 mg, 10 mg 5 mg then stop   Use Eucerin to keep skin moisturized  Follow up with Dr Mariea Clonts in 1 week

## 2014-10-10 NOTE — Progress Notes (Signed)
Patient ID: Toni Moses, female   DOB: Jul 17, 1960, 54 y.o.   MRN: [id number]    PCP: Hollace Kinnier, DO  Advanced Directive information    Allergies  Allergen Reactions  . Citalopram Other (See Comments)    "made her feel bad"    Chief Complaint  Patient presents with  . Acute Visit    Not feeling well- patient c/o rash (current creams not working) and increased nerve issues ( patient husband with decline in health)   . Medication Refill    Renew Xanax      HPI: Patient is a 54 y.o. female seen in the office today due to not feeling well. Hx of eczema. Husband has been really sick and it is stressing her out. Worse anxiety due to itching. Breaking out and itching all over. Had an appt with dermatologist but was unable to keep appt due to her husband being in the hospital. Was given hydrocodone 2.5% cream but has not been helpful.  Using once a day at night.  Nerves are bad due to husband being sick.  Reports she is now taking synthroid as prescribed   Review of Systems:  Review of Systems  Constitutional: Negative for fever and chills.  Eyes:       Legally blind  Respiratory: Negative for shortness of breath.   Cardiovascular: Negative for chest pain.  Gastrointestinal: Negative for abdominal pain.  Genitourinary: Negative for dysuria and frequency.  Skin: Positive for rash.  Neurological: Negative for dizziness.  Psychiatric/Behavioral: The patient is nervous/anxious.     Past Medical History  Diagnosis Date  . Thyroid disease   . Hypercholesteremia   . Anxiety   . Lumbago   . Hypertension   . Enlargement of lymph nodes   . Benign neoplasm of lymph nodes   . Other atopic dermatitis and related conditions   . Other abnormal blood chemistry   . Unspecified schizophrenia, unspecified condition   . Bipolar I disorder, most recent episode (or current) unspecified   . Depression   . Myalgia and myositis, unspecified   . Hypothyroidism   . Diabetes mellitus  type 2, controlled    Past Surgical History  Procedure Laterality Date  . Abdominal hysterectomy    . Thyroidectomy    . Appendectomy    . Eye surgery Right     september   Social History:   reports that she has been smoking Cigarettes.  She does not have any smokeless tobacco history on file. She reports that she does not drink alcohol or use illicit drugs.  History reviewed. No pertinent family history.  Medications: Patient's Medications  New Prescriptions   No medications on file  Previous Medications   ACETAMINOPHEN (TYLENOL) 500 MG TABLET    Take 500 mg by mouth every 6 (six) hours as needed for moderate pain.    ALPRAZOLAM (XANAX) 1 MG TABLET    take 1 tablet by mouth three times a day if needed   GABAPENTIN (NEURONTIN) 100 MG CAPSULE    Take 1 capsule (100 mg total) by mouth at bedtime.   HYDROCORTISONE 2.5 % CREAM    Apply topically 2 (two) times daily.   LEVOTHYROXINE (SYNTHROID, LEVOTHROID) 150 MCG TABLET    take 1 tablet by mouth once daily BEFORE BREAKFAST   QUETIAPINE (SEROQUEL) 50 MG TABLET    Take 1 tablet (50 mg total) by mouth at bedtime.   TRIAMCINOLONE CREAM (KENALOG) 0.5 %    Apply 1 application topically 2 (two)  times daily as needed (for ezcuma).   Modified Medications   Modified Medication Previous Medication   LISINOPRIL (PRINIVIL,ZESTRIL) 5 MG TABLET lisinopril (PRINIVIL,ZESTRIL) 5 MG tablet      Take 1 tablet (5 mg total) by mouth daily.    Take 1 tablet (5 mg total) by mouth daily.  Discontinued Medications   PRAVASTATIN (PRAVACHOL) 80 MG TABLET    Take 1 tablet (80 mg total) by mouth daily.     Physical Exam:  Filed Vitals:   10/10/14 1607  BP: 138/80  Pulse: 93  Temp: 98.3 F (36.8 C)  TempSrc: Oral  Resp: 16  Height: 5' 4.05" (1.627 m)  Weight: 170 lb (77.111 kg)  SpO2: 98%   Body mass index is 29.13 kg/(m^2).  Physical Exam  Constitutional: She is oriented to person, place, and time. She appears well-developed and well-nourished. No  distress.  HENT:  Head: Normocephalic and atraumatic.  Neck: Normal range of motion. Neck supple.  Cardiovascular: Normal rate, regular rhythm, normal heart sounds and intact distal pulses.   Pulmonary/Chest: Effort normal and breath sounds normal. No respiratory distress.  Abdominal: Soft. Bowel sounds are normal.  Musculoskeletal: She exhibits no tenderness.  Neurological: She is alert and oriented to person, place, and time.  Skin: Skin is warm and dry. Rash noted.  Eczema noted to bilateral arms and legs, skin with excoriation from itching     Labs reviewed: Basic Metabolic Panel:  Recent Labs  11/08/13 1122 05/10/14 0928 06/15/14 1616  NA 142 138  --   K 4.1 4.0  --   CL 104 107  --   CO2 23 23  --   GLUCOSE 91 109*  --   BUN 10 12  --   CREATININE 0.85 1.00  --   CALCIUM 9.2 8.8  --   TSH 1.180  --  13.640*   Liver Function Tests:  Recent Labs  11/08/13 1122  AST 12  ALT 8  ALKPHOS 97  BILITOT 0.4  PROT 7.2   No results for input(s): LIPASE, AMYLASE in the last 8760 hours. No results for input(s): AMMONIA in the last 8760 hours. CBC:  Recent Labs  11/08/13 1122 05/10/14 0928  WBC 4.3 6.7  NEUTROABS 2.8  --   HGB 11.4 11.8*  HCT 33.9* 36.2  MCV 83 84.6  PLT  --  346   Lipid Panel:  Recent Labs  11/08/13 1122  CHOL 219*  HDL 68  LDLCALC 129*  TRIG 112  CHOLHDL 3.2   TSH:  Recent Labs  11/08/13 1122 06/15/14 1616  TSH 1.180 13.640*   A1C: Lab Results  Component Value Date   HGBA1C 6.2* 06/15/2014     Assessment/Plan  1. Eczema -worse, creams not effective, to use prednisone taper for next week. Keep skin moisturized.   - predniSONE (DELTASONE) 5 MG tablet; To take 4 tablets daily for 4 days then decrease by 1 tablet daily until complete. 4,4,4,4,3,2,1  Dispense: 22 tablet; Refill: 0  2. DM type 2 with diabetic peripheral neuropathy -diet controlled, to cont diabetic diet and will follow up A1c - lisinopril  (PRINIVIL,ZESTRIL) 5 MG tablet; Take 1 tablet (5 mg total) by mouth daily.  Dispense: 30 tablet; Refill: 3 - CBC with Differential - Basic metabolic panel - Hemoglobin A1c  3. Anxiety -worse anxiety due to husband being sick and itching due to eczema  -conts with as needed xanax   4. Hypothyroidism, unspecified hypothyroidism type -reports compliance with medication, will follow  up lab - TSH  To follow up with Dr Mariea Clonts in 1 week  Carlos American. Harle Battiest  [facility] [phone] 8 am - 5 pm) [phone] (after hours)

## 2014-10-11 ENCOUNTER — Other Ambulatory Visit: Payer: Self-pay

## 2014-10-11 LAB — BASIC METABOLIC PANEL
BUN/Creatinine Ratio: 12 (ref 9–23)
BUN: 11 mg/dL (ref 6–24)
CO2: 24 mmol/L (ref 18–29)
Calcium: 9.8 mg/dL (ref 8.7–10.2)
Chloride: 107 mmol/L (ref 97–108)
Creatinine, Ser: 0.89 mg/dL (ref 0.57–1.00)
GFR calc non Af Amer: 74 mL/min/{1.73_m2} (ref >59)
GFR, EST AFRICAN AMERICAN: 85 mL/min/{1.73_m2} (ref >59)
Glucose: 90 mg/dL (ref 65–99)
POTASSIUM: 4.5 mmol/L (ref 3.5–5.2)
SODIUM: 145 mmol/L — AB (ref 134–144)

## 2014-10-11 LAB — CBC WITH DIFFERENTIAL/PLATELET
BASOS: 1 %
Basophils Absolute: 0 10*3/uL (ref 0.0–0.2)
EOS (ABSOLUTE): 0.4 10*3/uL (ref 0.0–0.4)
EOS: 7 %
Hematocrit: 34.8 % (ref 34.0–46.6)
Hemoglobin: 11.6 g/dL (ref 11.1–15.9)
Immature Grans (Abs): 0 10*3/uL (ref 0.0–0.1)
Immature Granulocytes: 0 %
LYMPHS ABS: 1.5 10*3/uL (ref 0.7–3.1)
Lymphs: 24 %
MCH: 26.9 pg (ref 26.6–33.0)
MCHC: 33.3 g/dL (ref 31.5–35.7)
MCV: 81 fL (ref 79–97)
MONOS ABS: 0.7 10*3/uL (ref 0.1–0.9)
Monocytes: 11 %
Neutrophils Absolute: 3.6 10*3/uL (ref 1.4–7.0)
Neutrophils: 57 %
Platelets: 374 10*3/uL (ref 150–379)
RBC: 4.32 x10E6/uL (ref 3.77–5.28)
RDW: 16.1 % — AB (ref 12.3–15.4)
WBC: 6.4 10*3/uL (ref 3.4–10.8)

## 2014-10-11 LAB — HEMOGLOBIN A1C
Est. average glucose Bld gHb Est-mCnc: 134 mg/dL
Hgb A1c MFr Bld: 6.3 % — ABNORMAL HIGH (ref 4.8–5.6)

## 2014-10-11 LAB — TSH: TSH: 1.55 u[IU]/mL (ref 0.450–4.500)

## 2014-10-11 MED ORDER — ALPRAZOLAM 1 MG PO TABS: ORAL_TABLET | ORAL | Status: DC

## 2014-10-11 NOTE — Telephone Encounter (Signed)
Patient informed that she should keep pending appointment on 10/20/14 or future refills will be denied per Sherrie Mustache, NP.

## 2014-10-12 ENCOUNTER — Ambulatory Visit: Payer: BC Managed Care – PPO | Admitting: Internal Medicine

## 2014-10-12 ENCOUNTER — Ambulatory Visit: Payer: BC Managed Care – PPO | Admitting: Nurse Practitioner

## 2014-10-20 ENCOUNTER — Ambulatory Visit: Payer: BC Managed Care – PPO | Admitting: Internal Medicine

## 2014-10-20 ENCOUNTER — Encounter: Payer: Self-pay | Admitting: Internal Medicine

## 2014-10-20 DIAGNOSIS — Z0289 Encounter for other administrative examinations: Secondary | ICD-10-CM

## 2014-10-27 ENCOUNTER — Ambulatory Visit: Payer: BC Managed Care – PPO | Admitting: Internal Medicine

## 2014-11-16 ENCOUNTER — Other Ambulatory Visit: Payer: Self-pay

## 2014-11-16 MED ORDER — ALPRAZOLAM 1 MG PO TABS: ORAL_TABLET | ORAL | Status: DC

## 2014-11-30 ENCOUNTER — Ambulatory Visit (INDEPENDENT_AMBULATORY_CARE_PROVIDER_SITE_OTHER): Payer: BC Managed Care – PPO | Admitting: Internal Medicine

## 2014-11-30 ENCOUNTER — Encounter: Payer: Self-pay | Admitting: Internal Medicine

## 2014-11-30 VITALS — BP 132/88 | HR 82 | Temp 97.5°F | Resp 12 | Ht 64.0 in | Wt 174.4 lb

## 2014-11-30 DIAGNOSIS — H548 Legal blindness, as defined in USA: Secondary | ICD-10-CM | POA: Insufficient documentation

## 2014-11-30 DIAGNOSIS — E7800 Pure hypercholesterolemia, unspecified: Secondary | ICD-10-CM

## 2014-11-30 DIAGNOSIS — L309 Dermatitis, unspecified: Secondary | ICD-10-CM | POA: Diagnosis not present

## 2014-11-30 DIAGNOSIS — F203 Undifferentiated schizophrenia: Secondary | ICD-10-CM

## 2014-11-30 DIAGNOSIS — E890 Postprocedural hypothyroidism: Secondary | ICD-10-CM | POA: Diagnosis not present

## 2014-11-30 DIAGNOSIS — Z636 Dependent relative needing care at home: Secondary | ICD-10-CM | POA: Diagnosis not present

## 2014-11-30 DIAGNOSIS — E1142 Type 2 diabetes mellitus with diabetic polyneuropathy: Secondary | ICD-10-CM | POA: Diagnosis not present

## 2014-11-30 MED ORDER — TRIAMCINOLONE ACETONIDE 0.5 % EX CREA: 1.0000 "application " | TOPICAL_CREAM | Freq: Two times a day (BID) | CUTANEOUS | Status: DC | PRN

## 2014-11-30 MED ORDER — QUETIAPINE FUMARATE 50 MG PO TABS: 50.0000 mg | ORAL_TABLET | Freq: Every day | ORAL | Status: DC

## 2014-11-30 NOTE — Progress Notes (Signed)
Patient ID: Toni Moses, female   DOB: [birth year], 54 y.o.   MRN: [id number]   Location: [place] Provider: Rexene Edison. Mariea Clonts, D.O., C.M.D.  Goals of Care: Advanced Directive information Does patient have an advance directive?: No, Would patient like information on creating an advanced directive?: No - patient declined information  Chief Complaint  Patient presents with  . Medical Management of Chronic Issues    2 month follow-up, not fasting today. DM foot exam due   . Medication Management    Discuss Seroquel, patient out x > 6 months, discuss Gabapentin out > 6 months, discuss Kenalog cream, out > 6 months    HPI: Patient is a 54 y.o. female seen in the office today for med mgt of chronic diseases.  She is in better spirits today and willing to stay for a full visit.  Eczema dries up in the winter.  Puts the cream on, absorbs and skin dries out real quick.  Kenalog cream out.  Out of seroquel--says for months.  Does help her schizophrenia.  Has not had for more than 6 mos.  Seroquel helps stabilize her mood, too.  Xanax helps with her mood swings.  Sometimes hallucinations like creepy stuff.  Will close her eyes and see crazy stuff.    Not using the tylenol.  Doesn't help her.  Not much of a sweets person.  Does love starchy stuff real bad.  Has been trying to eat healthy.  Breakfast with fruit and working out each evening.  Loves potatoes and bread.  Doing leg lifts and crunches.  Usually works out for an hour.   Had prior thyroid surgery and had to have dye-treatment to shrink treatment.  Now on levothyroxine.    Has not had nerve pain in her feet lately she says, but they are now achey some--thinks from exercising when she had not done that in a while.    Needs to quit, but says she cannot go to the classes.  She works for the [employer] and she cannot get off from work there.  If ratio low of blind employees, govt can come in and take money away.     Review of Systems:  Review of Systems  Constitutional: Negative for fever and chills.  HENT: Negative for congestion and hearing loss.   Eyes: Positive for blurred vision.       Legally blind now  Respiratory: Negative for shortness of breath.   Cardiovascular: Negative for chest pain and leg swelling.  Gastrointestinal: Negative for abdominal pain, constipation, blood in stool and melena.  Genitourinary: Negative for dysuria and urgency.  Musculoskeletal: Positive for myalgias. Negative for falls.       Due to exercising when she had not for a while  Skin: Positive for itching and rash.       eczema  Neurological: Positive for tingling and sensory change. Negative for weakness.  Endo/Heme/Allergies:       Diabetic, hypothyroid  Psychiatric/Behavioral: Positive for depression and hallucinations. The patient is nervous/anxious.     Past Medical History  Diagnosis Date  . Thyroid disease   . Hypercholesteremia   . Anxiety   . Lumbago   . Hypertension   . Enlargement of lymph nodes   . Benign neoplasm of lymph nodes   . Other atopic dermatitis and related conditions   . Other abnormal blood chemistry   . Unspecified schizophrenia, unspecified condition   . Bipolar I disorder, most recent episode (or  current) unspecified   . Depression   . Myalgia and myositis, unspecified   . Hypothyroidism   . Diabetes mellitus type 2, controlled [facility])     Past Surgical History  Procedure Laterality Date  . Abdominal hysterectomy    . Thyroidectomy    . Appendectomy    . Eye surgery Right     september    Allergies  Allergen Reactions  . Citalopram Other (See Comments)    "made her feel bad"      Medication List       This list is accurate as of: 11/30/14  8:21 AM.  Always use your most recent med list.               ALPRAZolam 1 MG tablet  Commonly known as:  XANAX  take 1 tablet by mouth three times a day if needed     gabapentin 100 MG capsule  Commonly known  as:  NEURONTIN  Take 1 capsule (100 mg total) by mouth at bedtime.     hydrocortisone 2.5 % cream  Apply topically 2 (two) times daily.     levothyroxine 150 MCG tablet  Commonly known as:  SYNTHROID, LEVOTHROID  take 1 tablet by mouth once daily BEFORE BREAKFAST     lisinopril 5 MG tablet  Commonly known as:  PRINIVIL,ZESTRIL  Take 1 tablet (5 mg total) by mouth daily.     QUEtiapine 50 MG tablet  Commonly known as:  SEROQUEL  Take 1 tablet (50 mg total) by mouth at bedtime.     triamcinolone cream 0.5 %  Commonly known as:  KENALOG  Apply 1 application topically 2 (two) times daily as needed (for ezcuma).        Health Maintenance  Topic Date Due  . Hepatitis C Screening  1961/01/20  . PNEUMOCOCCAL POLYSACCHARIDE VACCINE (1) 02/03/1962  . HIV Screening  02/04/1975  . PAP SMEAR  01/21/2014  . FOOT EXAM  04/20/2014  . OPHTHALMOLOGY EXAM  05/25/2014  . URINE MICROALBUMIN  07/14/2014  . MAMMOGRAM  08/10/2015 (Originally 01/23/2011)  . DEXA SCAN  08/10/2015 (Originally 09-18-1960)  . INFLUENZA VACCINE  01/21/2048 (Originally 08/21/2014)  . HEMOGLOBIN A1C  04/09/2015  . TETANUS/TDAP  01/22/2020  . COLONOSCOPY  07/14/2023    Physical Exam: Filed Vitals:   11/30/14 0753  BP: 132/88  Pulse: 82  Temp: 97.5 F (36.4 C)  TempSrc: Oral  Resp: 12  Height: 5\' 4"  (1.626 m)  Weight: 174 lb 6.4 oz (79.107 kg)  SpO2: 95%   Body mass index is 29.92 kg/(m^2). Physical Exam  Constitutional: She is oriented to person, place, and time. She appears well-developed and well-nourished. No distress.  HENT:  Head: Normocephalic and atraumatic.  Eyes:  Legally blind  Neck: Neck supple.  Cardiovascular: Normal rate, regular rhythm, normal heart sounds and intact distal pulses.   Pulmonary/Chest: Effort normal and breath sounds normal. No respiratory distress.  Abdominal: Soft. Bowel sounds are normal. She exhibits no distension and no mass. There is no tenderness.  Musculoskeletal:  Normal range of motion.  Neurological: She is alert and oriented to person, place, and time.  Skin:  Dry scaly skin patches present  Psychiatric: She has a normal mood and affect.    Labs reviewed: Basic Metabolic Panel:  Recent Labs  05/10/14 0928 06/15/14 1616 10/10/14 1706  NA 138  --  145*  K 4.0  --  4.5  CL 107  --  107  CO2 23  --  24  GLUCOSE 109*  --  90  BUN 12  --  11  CREATININE 1.00  --  0.89  CALCIUM 8.8  --  9.8  TSH  --  13.640* 1.550   Liver Function Tests: No results for input(s): AST, ALT, ALKPHOS, BILITOT, PROT, ALBUMIN in the last 8760 hours. No results for input(s): LIPASE, AMYLASE in the last 8760 hours. No results for input(s): AMMONIA in the last 8760 hours. CBC:  Recent Labs  05/10/14 0928 10/10/14 1706  WBC 6.7 6.4  NEUTROABS  --  3.6  HGB 11.8*  --   HCT 36.2 34.8  MCV 84.6  --   PLT 346  --    Lipid Panel: No results for input(s): CHOL, HDL, LDLCALC, TRIG, CHOLHDL, LDLDIRECT in the last 8760 hours. Lab Results  Component Value Date   HGBA1C 6.3* 10/10/2014   Assessment/Plan 1. Eczema -this was her biggest concern today--resume cream and provided with refills so she does not run out  - triamcinolone cream (KENALOG) 0.5 %; Apply 1 application topically 2 (two) times daily as needed (for ezcuma).  Dispense: 30 g; Refill: 5  2. Undifferentiated schizophrenia ([place]) - per history, notes she does hallucinate at times -will resume her seroquel but this may affect her glucose levels - QUEtiapine (SEROQUEL) 50 MG tablet; Take 1 tablet (50 mg total) by mouth at bedtime.  Dispense: 30 tablet; Refill: 5  3. DM type 2 with diabetic peripheral neuropathy (HCC) - control improving -cont exercise program, discussed dietary changes extensively today -cont ace, should also be on statin - Hemoglobin A1c; Future - Basic metabolic panel; Future - CBC with Differential/Platelet; Future  4. Hypercholesteremia -is diabetic and not on statin  therapy -will f/u lipid panel, but really should be on a low dose even if fairly good lipid panel - Lipid panel; Future  5. Postoperative hypothyroidism - and also had radioactive iodine it sounds like - cont current synthroid and f/u tsh - TSH; Future  6. Caregiver stress -continues to look after her husband who's had bilateral LE amputations -she is stressed with this and working, but her blindness -cont xanax for this and her schizophrenia  7. Legal blindness -works at [employer] -interestingly organization does not allow its patients off from work to come to appts on a consistent basis  Labs/tests ordered:   Orders Placed This Encounter  Procedures  . Hemoglobin A1c    Standing Status: Future     Number of Occurrences:      Standing Expiration Date: 03/30/2015  . Lipid panel    Standing Status: Future     Number of Occurrences:      Standing Expiration Date: 03/30/2015    Order Specific Question:  Has the patient fasted?    Answer:  Yes  . Basic metabolic panel    Standing Status: Future     Number of Occurrences:      Standing Expiration Date: 03/30/2015    Order Specific Question:  Has the patient fasted?    Answer:  Yes  . CBC with Differential/Platelet    Standing Status: Future     Number of Occurrences:      Standing Expiration Date: 03/30/2015  . TSH    Standing Status: Future     Number of Occurrences:      Standing Expiration Date: 03/30/2015   Next appt:  03/30/2015 with labs before   Livingston Wheeler. Olivier Frayre, D.O. [facility] [phone]  [address], [place] [address] Cell Phone (Mon-Fri 8am-5pm):  [phone] On Call:  [phone] & follow prompts after 5pm & weekends Office Phone:  [phone] Office Fax:  [phone]

## 2014-12-07 ENCOUNTER — Telehealth: Payer: Self-pay | Admitting: *Deleted

## 2014-12-07 NOTE — Telephone Encounter (Signed)
Patient called requesting a refill on Xanax, patient last refilled on 11/16/2014. Informed patient that is was not due yet and we could refill on 12/16/14. Patient will call back.

## 2014-12-18 ENCOUNTER — Other Ambulatory Visit: Payer: Self-pay | Admitting: *Deleted

## 2014-12-18 MED ORDER — ALPRAZOLAM 1 MG PO TABS: ORAL_TABLET | ORAL | Status: DC

## 2014-12-18 NOTE — Telephone Encounter (Signed)
[facility] 

## 2014-12-19 ENCOUNTER — Other Ambulatory Visit: Payer: Self-pay | Admitting: *Deleted

## 2014-12-19 DIAGNOSIS — L309 Dermatitis, unspecified: Secondary | ICD-10-CM

## 2014-12-19 MED ORDER — HYDROCORTISONE 2.5 % EX CREA: TOPICAL_CREAM | Freq: Two times a day (BID) | CUTANEOUS | Status: AC

## 2014-12-20 ENCOUNTER — Emergency Department (HOSPITAL_COMMUNITY)
Admission: EM | Admit: 2014-12-20 | Discharge: 2014-12-21 | Disposition: A | Discharge status: Home / Self Care | Payer: BC Managed Care – PPO | Attending: Emergency Medicine | Admitting: Emergency Medicine

## 2014-12-20 ENCOUNTER — Encounter (HOSPITAL_COMMUNITY): Payer: Self-pay | Admitting: Neurology

## 2014-12-20 ENCOUNTER — Emergency Department (HOSPITAL_COMMUNITY): Payer: BC Managed Care – PPO

## 2014-12-20 DIAGNOSIS — R0789 Other chest pain: Secondary | ICD-10-CM | POA: Insufficient documentation

## 2014-12-20 DIAGNOSIS — F419 Anxiety disorder, unspecified: Secondary | ICD-10-CM | POA: Insufficient documentation

## 2014-12-20 DIAGNOSIS — Z7952 Long term (current) use of systemic steroids: Secondary | ICD-10-CM | POA: Insufficient documentation

## 2014-12-20 DIAGNOSIS — I10 Essential (primary) hypertension: Secondary | ICD-10-CM | POA: Diagnosis not present

## 2014-12-20 DIAGNOSIS — E119 Type 2 diabetes mellitus without complications: Secondary | ICD-10-CM | POA: Insufficient documentation

## 2014-12-20 DIAGNOSIS — R079 Chest pain, unspecified: Secondary | ICD-10-CM | POA: Diagnosis present

## 2014-12-20 DIAGNOSIS — Z7982 Long term (current) use of aspirin: Secondary | ICD-10-CM | POA: Insufficient documentation

## 2014-12-20 DIAGNOSIS — F319 Bipolar disorder, unspecified: Secondary | ICD-10-CM | POA: Insufficient documentation

## 2014-12-20 DIAGNOSIS — F17210 Nicotine dependence, cigarettes, uncomplicated: Secondary | ICD-10-CM | POA: Insufficient documentation

## 2014-12-20 DIAGNOSIS — R51 Headache: Secondary | ICD-10-CM | POA: Diagnosis not present

## 2014-12-20 DIAGNOSIS — Z862 Personal history of diseases of the blood and blood-forming organs and certain disorders involving the immune mechanism: Secondary | ICD-10-CM | POA: Diagnosis not present

## 2014-12-20 DIAGNOSIS — M79602 Pain in left arm: Secondary | ICD-10-CM | POA: Diagnosis not present

## 2014-12-20 DIAGNOSIS — Z79899 Other long term (current) drug therapy: Secondary | ICD-10-CM | POA: Insufficient documentation

## 2014-12-20 DIAGNOSIS — E039 Hypothyroidism, unspecified: Secondary | ICD-10-CM | POA: Diagnosis not present

## 2014-12-20 DIAGNOSIS — R519 Headache, unspecified: Secondary | ICD-10-CM

## 2014-12-20 LAB — CBC
HCT: 35.8 % — ABNORMAL LOW (ref 36.0–46.0)
Hemoglobin: 11.6 g/dL — ABNORMAL LOW (ref 12.0–15.0)
MCH: 28 pg (ref 26.0–34.0)
MCHC: 32.4 g/dL (ref 30.0–36.0)
MCV: 86.3 fL (ref 78.0–100.0)
PLATELETS: 322 10*3/uL (ref 150–400)
RBC: 4.15 MIL/uL (ref 3.87–5.11)
RDW: 17.5 % — AB (ref 11.5–15.5)
WBC: 6 10*3/uL (ref 4.0–10.5)

## 2014-12-20 LAB — BASIC METABOLIC PANEL
Anion gap: 5 (ref 5–15)
BUN: 10 mg/dL (ref 6–20)
CHLORIDE: 107 mmol/L (ref 101–111)
CO2: 27 mmol/L (ref 22–32)
CREATININE: 1.12 mg/dL — AB (ref 0.44–1.00)
Calcium: 9.2 mg/dL (ref 8.9–10.3)
GFR calc Af Amer: >60 mL/min (ref >60)
GFR, EST NON AFRICAN AMERICAN: 55 mL/min — AB (ref >60)
Glucose, Bld: 93 mg/dL (ref 65–99)
Potassium: 4.4 mmol/L (ref 3.5–5.1)
SODIUM: 139 mmol/L (ref 135–145)

## 2014-12-20 LAB — I-STAT TROPONIN, ED: Troponin i, poc: 0 ng/mL (ref 0.00–0.08)

## 2014-12-20 MED ORDER — SODIUM CHLORIDE 0.9 % IV BOLUS (SEPSIS)
1000.0000 mL | Freq: Once | INTRAVENOUS | Status: AC
  Administered 2014-12-20: 1000 mL via INTRAVENOUS

## 2014-12-20 MED ORDER — LORAZEPAM 2 MG/ML IJ SOLN
1.0000 mg | Freq: Once | INTRAMUSCULAR | Status: AC
  Administered 2014-12-20: 1 mg via INTRAVENOUS
  Filled 2014-12-20: qty 1

## 2014-12-20 MED ORDER — IBUPROFEN 800 MG PO TABS
800.0000 mg | ORAL_TABLET | Freq: Once | ORAL | Status: AC
  Administered 2014-12-20: 800 mg via ORAL
  Filled 2014-12-20: qty 1

## 2014-12-20 MED ORDER — IOHEXOL 300 MG/ML  SOLN
75.0000 mL | Freq: Once | INTRAMUSCULAR | Status: AC | PRN
  Administered 2014-12-20: 75 mL via INTRAVENOUS

## 2014-12-20 NOTE — ED Notes (Signed)
Pt reports this morning had left sided cp and h/a, the symptoms went away. They came back at 1630. At current left sided cp 8/10, radiating into left arm, feeling nauseated with headache. Pt is crying in triage. Denies cardiac hx.

## 2014-12-20 NOTE — ED Notes (Signed)
Patient transported to CT 

## 2014-12-20 NOTE — ED Provider Notes (Signed)
CSN: [id number]     Arrival date & time 12/20/14  [place] History   First MD Initiated Contact with Patient 12/20/14 2035     Chief Complaint  Patient presents with  . Chest Pain  . Headache     (Consider location/radiation/quality/duration/timing/severity/associated sxs/prior Treatment) HPI   Toni Moses is a 54 y.o. female with PMH significant for HTN, HLD, anxiety, HTN, myalgia, DM who presents with left sided CP, left arm pain, and headache that began this morning approximately 4:30 AM.  She states that the symptoms went away and she was able to go to work, but they returned around 1:30 PM.  She describes the CP as constant and burning.  It does not radiate.  It is made worse with bending over.  No meds PTA.  Denies trauma or injury.  Associated symptoms include nausea, fatigue, left sided neck pain, and mild dizziness.  Denies vomiting, fever, neck stiffness, arm swelling, SOB, or abdominal pain.   Past Medical History  Diagnosis Date  . Thyroid disease   . Hypercholesteremia   . Anxiety   . Lumbago   . Hypertension   . Enlargement of lymph nodes   . Benign neoplasm of lymph nodes   . Other atopic dermatitis and related conditions   . Other abnormal blood chemistry   . Unspecified schizophrenia, unspecified condition   . Bipolar I disorder, most recent episode (or current) unspecified   . Depression   . Myalgia and myositis, unspecified   . Hypothyroidism   . Diabetes mellitus type 2, controlled [facility])    Past Surgical History  Procedure Laterality Date  . Abdominal hysterectomy    . Thyroidectomy    . Appendectomy    . Eye surgery Right     september   Family History  Problem Relation Age of Onset  . Heart disease Other    Social History  Substance Use Topics  . Smoking status: Current Every Day Smoker    Types: Cigarettes  . Smokeless tobacco: None  . Alcohol Use: No   OB History    No data available     Review of Systems All other systems negative  unless otherwise stated in HPI    Allergies  Citalopram  Home Medications   Prior to Admission medications   Medication Sig Start Date End Date Taking? Authorizing Provider  ALPRAZolam Duanne Moron) 1 MG tablet take 1 tablet by mouth three times a day if needed 12/18/14  Yes Tiffany L Reed, DO  aspirin 325 MG tablet Take 325 mg by mouth every 6 (six) hours as needed for moderate pain.   Yes Historical Provider, MD  hydrocortisone 2.5 % cream Apply topically 2 (two) times daily. 12/19/14  Yes Tiffany L Reed, DO  levothyroxine (SYNTHROID, LEVOTHROID) 150 MCG tablet take 1 tablet by mouth once daily BEFORE BREAKFAST Patient taking differently: Take 150 mcg by mouth daily before breakfast.  03/03/14  Yes Mahima Pandey, MD  lisinopril (PRINIVIL,ZESTRIL) 5 MG tablet Take 1 tablet (5 mg total) by mouth daily. 10/10/14  Yes Tiffany L Reed, DO  QUEtiapine (SEROQUEL) 50 MG tablet Take 1 tablet (50 mg total) by mouth at bedtime. 11/30/14  Yes Tiffany L Reed, DO  triamcinolone cream (KENALOG) 0.5 % Apply 1 application topically 2 (two) times daily as needed (for ezcuma). 11/30/14  Yes Tiffany L Reed, DO   BP 131/66 mmHg  Pulse 74  Temp(Src) 98.4 F (36.9 C) (Oral)  Resp 14  SpO2 97% Physical Exam  Constitutional:  She is oriented to person, place, and time. She appears well-developed and well-nourished.  HENT:  Head: Normocephalic and atraumatic.  Mouth/Throat: Oropharynx is clear and moist.  Eyes: Conjunctivae are normal. Pupils are equal, round, and reactive to light.  Neck: Normal range of motion. Neck supple.  Cardiovascular: Normal rate, regular rhythm and normal heart sounds.   No murmur heard. Pulmonary/Chest: Effort normal and breath sounds normal. No accessory muscle usage or stridor. No respiratory distress. She has no wheezes. She has no rhonchi. She has no rales.  Abdominal: Soft. Bowel sounds are normal. She exhibits no distension. There is no tenderness.  Musculoskeletal: Normal range  of motion.  No upper extremity swelling.   Lymphadenopathy:    She has no cervical adenopathy.  Neurological: She is alert and oriented to person, place, and time.  Mental Status:   AOx3.  Speech clear without dysarthria. Cranial Nerves:  I-not tested  II-PERRLA  III, IV, VI-EOMs intact  V-temporal and masseter strength intact  VII-symmetrical facial movements intact, no facial droop  VIII-hearing grossly intact bilaterally  IX, X-gag intact  XI-strength of sternomastoid and trapezius muscles 5/5  XII-tongue midline Motor:   Good muscle bulk and tone  Strength 5/5 bilaterally in upper and lower extremities   Cerebellar--RAMs, finger to nose intact  Romberg--maintains balance with eyes closed  Casual and tandem gait normal without ataxia  No pronator drift Sensory:  Intact in upper and lower extremities   Skin: Skin is warm and dry.  Psychiatric: She has a normal mood and affect. Her behavior is normal.    ED Course  Procedures (including critical care time) Labs Review Labs Reviewed  BASIC METABOLIC PANEL - Abnormal; Notable for the following:    Creatinine, Ser 1.12 (*)    GFR calc non Af Amer 55 (*)    All other components within normal limits  CBC - Abnormal; Notable for the following:    Hemoglobin 11.6 (*)    HCT 35.8 (*)    RDW 17.5 (*)    All other components within normal limits  I-STAT TROPOININ, ED  Randolm Idol, ED    Imaging Review Dg Chest 2 View  12/20/2014  CLINICAL DATA:  Chest pressure, nausea, dizziness.  Smoking history. EXAM: CHEST  2 VIEW COMPARISON:  Chest x-ray dated 05/10/2014. FINDINGS: There is a new nodular density projected over the left lung apex which measures 1.9 x 1.8 cm, worrisome for neoplastic pulmonary nodule. Lungs appear otherwise clear. No pleural effusion seen. Heart size is upper normal, stable. Overall cardiomediastinal silhouette is normal in size and configuration. Minimal degenerative change noted within the mid  thoracic spine. No acute - appearing osseous abnormality. Focal left hemidiaphragm eventration is stable. IMPRESSION: Suspect new pulmonary nodule within the left upper lobe measuring 1.9 x 1.8 cm. Recommend chest CT for further characterization. Remainder of the chest x-ray is unremarkable, as detailed above. These results were called by telephone at the time of interpretation on 12/20/2014 at 7:30 pm to Dr. Lajean Saver , who verbally acknowledged these results. Electronically Signed   By: Franki Cabot M.D.   On: 12/20/2014 19:28   Ct Head Wo Contrast  12/20/2014  CLINICAL DATA:  54 year old female with frontal headache EXAM: CT HEAD WITHOUT CONTRAST TECHNIQUE: Contiguous axial images were obtained from the base of the skull through the vertex without intravenous contrast. COMPARISON:  Head CT dated 11/03/2012 FINDINGS: The ventricles and the sulci are appropriate in size for the patient's age. There is no intracranial  hemorrhage. No midline shift or mass effect identified. The gray-white matter differentiation is preserved. Mild bilateral basal ganglia calcification noted. The visualized paranasal sinuses and mastoid air cells are well aerated. The calvarium is intact. There is chronic deformity of the right globe with thickening of the right palpebral tissues. IMPRESSION: No acute intracranial pathology. Electronically Signed   By: Anner Crete M.D.   On: 12/20/2014 23:58   Ct Chest W Contrast  12/21/2014  CLINICAL DATA:  54 year old female with frontal headache. Abnormal finding seen on the chest radiograph dated 12/20/2014 EXAM: CT CHEST WITH CONTRAST TECHNIQUE: Multidetector CT imaging of the chest was performed during intravenous contrast administration. CONTRAST:  75mL OMNIPAQUE IOHEXOL 300 MG/ML  SOLN COMPARISON:  Chest radiograph dated 12/20/2014 FINDINGS: There is mild centrilobular and biapical emphysema. The lungs are otherwise clear. There is no consolidation or pulmonary nodule. The  described pulmonary nodule on the recent radiograph corresponds to the left second rib end appearing as a nodule en Foss. Minimal bibasilar dependent atelectatic changes noted. There is no pleural effusion. The central airways are patent. The thoracic aorta and central pulmonary arteries appear unremarkable. There is no cardiomegaly. Trace pericardial fluid noted, likely physiologic. There is no hilar or mediastinal adenopathy. The thyroid gland is not visualized. The esophagus is grossly unremarkable. There is no axillary adenopathy. The thoracic wall soft tissues appear unremarkable. The visualized osseous structures appear intact. There is apparent thickening of the right adrenal gland versus hyperplasia. A mildly enlarged lymph nodes noted adjacent to the celiac axis of indeterminate clinical significance. There is a focal area of defect in the left posterior diaphragm with herniation of small amount of mesentery fat compatible with a Bochdalek hernia. IMPRESSION: No acute chest pathology or pulmonary nodule identified. The pulmonary nodules described on the chest radiograph corresponds to the left second rib end. Mildly enlarged celiac axis lymph nodes, nonspecific. Electronically Signed   By: Anner Crete M.D.   On: 12/21/2014 00:12   I have personally reviewed and evaluated these images and lab results as part of my medical decision-making.   EKG Interpretation   Date/Time:  Wednesday December 20 2014 18:41:56 EST Ventricular Rate:  62 PR Interval:  166 QRS Duration: 74 QT Interval:  408 QTC Calculation: 414 R Axis:   43 Text Interpretation:  Normal sinus rhythm No significant change since last  tracing Confirmed by Ashok Cordia  MD, Lennette Bihari ([id number]) on 12/20/2014 10:54:59 PM      MDM   Final diagnoses:  Nonintractable headache, unspecified chronicity pattern, unspecified headache type  Atypical chest pain  Left arm pain    Patient presents with left sided chest pain, left arm pain, and  headache.  No injury/trauma.  VSS, NAD.  On exam, heart RRR, lungs CTAB, abdomen soft and nontender.  No focal neurological deficits.  Will obtain EKG, BMP, CBC, troponin, and CXR.  Will give fluids and ibuprofen.  HEART score 3.  Will obtain delta troponin.  -EKG shows NSR, no acute changes.  Troponin 0.00.  Delta troponin 0.00. -BMP shows Cr 1.12, which appears to be her baseline -CBC unremarkable -CXR shows new nodular density of left lung apex that is worrisome for neoplastic pulmonary nodule.  Will obtain CT chest and head.  CT head negative.  CT chest shows no acute chest pathology or pulmonary nodule.  Pulmonary nodules on CXR correspond to L second rib end.  Evaluation does not show pathology requring ongoing emergent intervention or admission. Pt is hemodynamically stable and mentating appropriately.  Discussed findings/results and plan with patient/guardian, who agrees with plan. All questions answered. Return precautions discussed and outpatient follow up given.   Case has been discussed with and seen by Dr. Ashok Cordia who agrees with the above plan for discharge.     Gloriann Loan, PA-C 12/21/14 0126  Lajean Saver, MD 12/22/14 1500

## 2014-12-21 LAB — I-STAT TROPONIN, ED: TROPONIN I, POC: 0 ng/mL (ref 0.00–0.08)

## 2014-12-21 MED ORDER — IBUPROFEN 800 MG PO TABS
800.0000 mg | ORAL_TABLET | Freq: Three times a day (TID) | ORAL | Status: DC
Start: 2014-12-21 — End: 2014-12-29

## 2014-12-21 MED ORDER — ACETAMINOPHEN 325 MG PO TABS
650.0000 mg | ORAL_TABLET | Freq: Once | ORAL | Status: AC
  Administered 2014-12-21: 650 mg via ORAL
  Filled 2014-12-21: qty 2

## 2014-12-21 MED ORDER — ACETAMINOPHEN 500 MG PO TABS: 500.0000 mg | ORAL_TABLET | Freq: Four times a day (QID) | ORAL | Status: DC | PRN

## 2014-12-21 NOTE — Discharge Instructions (Signed)
Migraine Headache A migraine headache is an intense, throbbing pain on one or both sides of your head. A migraine can last for 30 minutes to several hours. CAUSES  The exact cause of a migraine headache is not always known. However, a migraine may be caused when nerves in the brain become irritated and release chemicals that cause inflammation. This causes pain. Certain things may also trigger migraines, such as:  Alcohol.  Smoking.  Stress.  Menstruation.  Aged cheeses.  Foods or drinks that contain nitrates, glutamate, aspartame, or tyramine.  Lack of sleep.  Chocolate.  Caffeine.  Hunger.  Physical exertion.  Fatigue.  Medicines used to treat chest pain (nitroglycerine), birth control pills, estrogen, and some blood pressure medicines. SIGNS AND SYMPTOMS  Pain on one or both sides of your head.  Pulsating or throbbing pain.  Severe pain that prevents daily activities.  Pain that is aggravated by any physical activity.  Nausea, vomiting, or both.  Dizziness.  Pain with exposure to bright lights, loud noises, or activity.  General sensitivity to bright lights, loud noises, or smells. Before you get a migraine, you may get warning signs that a migraine is coming (aura). An aura may include:  Seeing flashing lights.  Seeing bright spots, halos, or zigzag lines.  Having tunnel vision or blurred vision.  Having feelings of numbness or tingling.  Having trouble talking.  Having muscle weakness. DIAGNOSIS  A migraine headache is often diagnosed based on:  Symptoms.  Physical exam.  A CT scan or MRI of your head. These imaging tests cannot diagnose migraines, but they can help rule out other causes of headaches. TREATMENT Medicines may be given for pain and nausea. Medicines can also be given to help prevent recurrent migraines.  HOME CARE INSTRUCTIONS  Only take over-the-counter or prescription medicines for pain or discomfort as directed by your  health care provider. The use of long-term narcotics is not recommended.  Lie down in a dark, quiet room when you have a migraine.  Keep a journal to find out what may trigger your migraine headaches. For example, write down:  What you eat and drink.  How much sleep you get.  Any change to your diet or medicines.  Limit alcohol consumption.  Quit smoking if you smoke.  Get 7-9 hours of sleep, or as recommended by your health care provider.  Limit stress.  Keep lights dim if bright lights bother you and make your migraines worse. SEEK IMMEDIATE MEDICAL CARE IF:   Your migraine becomes severe.  You have a fever.  You have a stiff neck.  You have vision loss.  You have muscular weakness or loss of muscle control.  You start losing your balance or have trouble walking.  You feel faint or pass out.  You have severe symptoms that are different from your first symptoms. MAKE SURE YOU:   Understand these instructions.  Will watch your condition.  Will get help right away if you are not doing well or get worse.   This information is not intended to replace advice given to you by your health care provider. Make sure you discuss any questions you have with your health care provider.   Document Released: 01/06/2005 Document Revised: 01/27/2014 Document Reviewed: 09/13/2012 Elsevier Interactive Patient Education 2016 [employer]  Nonspecific Chest Pain It is often hard to find the cause of chest pain. There is always a chance that your pain could be related to something serious, such as a heart attack or  a blood clot in your lungs. Chest pain can also be caused by conditions that are not life-threatening. If you have chest pain, it is very important to follow up with your doctor.  HOME CARE  If you were prescribed an antibiotic medicine, finish it all even if you start to feel better.  Avoid any activities that cause chest pain.  Do not use any tobacco products,  including cigarettes, chewing tobacco, or electronic cigarettes. If you need help quitting, ask your doctor.  Do not drink alcohol.  Take medicines only as told by your doctor.  Keep all follow-up visits as told by your doctor. This is important. This includes any further testing if your chest pain does not go away.  Your doctor may tell you to keep your head raised (elevated) while you sleep.  Make lifestyle changes as told by your doctor. These may include:  Getting regular exercise. Ask your doctor to suggest some activities that are safe for you.  Eating a heart-healthy diet. Your doctor or a diet specialist (dietitian) can help you to learn healthy eating options.  Maintaining a healthy weight.  Managing diabetes, if necessary.  Reducing stress. GET HELP IF:  Your chest pain does not go away, even after treatment.  You have a rash with blisters on your chest.  You have a fever. GET HELP RIGHT AWAY IF:  Your chest pain is worse.  You have an increasing cough, or you cough up blood.  You have severe belly (abdominal) pain.  You feel extremely weak.  You pass out (faint).  You have chills.  You have sudden, unexplained chest discomfort.  You have sudden, unexplained discomfort in your arms, back, neck, or jaw.  You have shortness of breath at any time.  You suddenly start to sweat, or your skin gets clammy.  You feel nauseous.  You vomit.  You suddenly feel light-headed or dizzy.  Your heart begins to beat quickly, or it feels like it is skipping beats. These symptoms may be an emergency. Do not wait to see if the symptoms will go away. Get medical help right away. Call your local emergency services (911 in the [place] Do not drive yourself to the hospital.   This information is not intended to replace advice given to you by your health care provider. Make sure you discuss any questions you have with your health care provider.   Document Released:  06/25/2007 Document Revised: 01/27/2014 Document Reviewed: 08/12/2013 Elsevier Interactive Patient Education [employer].

## 2014-12-29 ENCOUNTER — Encounter (HOSPITAL_COMMUNITY): Payer: Self-pay | Admitting: *Deleted

## 2014-12-29 ENCOUNTER — Emergency Department (HOSPITAL_COMMUNITY)
Admission: EM | Admit: 2014-12-29 | Discharge: 2014-12-29 | Disposition: A | Discharge status: Home / Self Care | Payer: BC Managed Care – PPO | Attending: Emergency Medicine | Admitting: Emergency Medicine

## 2014-12-29 DIAGNOSIS — M549 Dorsalgia, unspecified: Secondary | ICD-10-CM

## 2014-12-29 DIAGNOSIS — E079 Disorder of thyroid, unspecified: Secondary | ICD-10-CM | POA: Insufficient documentation

## 2014-12-29 DIAGNOSIS — I10 Essential (primary) hypertension: Secondary | ICD-10-CM | POA: Diagnosis not present

## 2014-12-29 DIAGNOSIS — Z7982 Long term (current) use of aspirin: Secondary | ICD-10-CM | POA: Insufficient documentation

## 2014-12-29 DIAGNOSIS — Y998 Other external cause status: Secondary | ICD-10-CM | POA: Insufficient documentation

## 2014-12-29 DIAGNOSIS — E119 Type 2 diabetes mellitus without complications: Secondary | ICD-10-CM | POA: Diagnosis not present

## 2014-12-29 DIAGNOSIS — F419 Anxiety disorder, unspecified: Secondary | ICD-10-CM | POA: Diagnosis not present

## 2014-12-29 DIAGNOSIS — Y9389 Activity, other specified: Secondary | ICD-10-CM | POA: Diagnosis not present

## 2014-12-29 DIAGNOSIS — E039 Hypothyroidism, unspecified: Secondary | ICD-10-CM | POA: Diagnosis not present

## 2014-12-29 DIAGNOSIS — F17210 Nicotine dependence, cigarettes, uncomplicated: Secondary | ICD-10-CM | POA: Insufficient documentation

## 2014-12-29 DIAGNOSIS — Y92410 Unspecified street and highway as the place of occurrence of the external cause: Secondary | ICD-10-CM | POA: Diagnosis not present

## 2014-12-29 DIAGNOSIS — S3992XA Unspecified injury of lower back, initial encounter: Secondary | ICD-10-CM | POA: Insufficient documentation

## 2014-12-29 MED ORDER — NAPROXEN 500 MG PO TABS: 500.0000 mg | ORAL_TABLET | Freq: Two times a day (BID) | ORAL | Status: DC

## 2014-12-29 MED ORDER — METHOCARBAMOL 500 MG PO TABS: 500.0000 mg | ORAL_TABLET | Freq: Two times a day (BID) | ORAL | Status: DC

## 2014-12-29 NOTE — Discharge Instructions (Signed)
Take the prescribed medication as directed.  You may continue to be sore for the next few days which is normal. Follow-up with your primary care physician. Return to the ED for new or worsening symptoms.  Back Pain, Adult Back pain is very common in adults.The cause of back pain is rarely dangerous and the pain often gets better over time.The cause of your back pain may not be known. Some common causes of back pain include:  Strain of the muscles or ligaments supporting the spine.  Wear and tear (degeneration) of the spinal disks.  Arthritis.  Direct injury to the back. For many people, back pain may return. Since back pain is rarely dangerous, most people can learn to manage this condition on their own. HOME CARE INSTRUCTIONS Watch your back pain for any changes. The following actions may help to lessen any discomfort you are feeling:  Remain active. It is stressful on your back to sit or stand in one place for long periods of time. Do not sit, drive, or stand in one place for more than 30 minutes at a time. Take short walks on even surfaces as soon as you are able.Try to increase the length of time you walk each day.  Exercise regularly as directed by your health care provider. Exercise helps your back heal faster. It also helps avoid future injury by keeping your muscles strong and flexible.  Do not stay in bed.Resting more than 1-2 days can delay your recovery.  Pay attention to your body when you bend and lift. The most comfortable positions are those that put less stress on your recovering back. Always use proper lifting techniques, including:  Bending your knees.  Keeping the load close to your body.  Avoiding twisting.  Find a comfortable position to sleep. Use a firm mattress and lie on your side with your knees slightly bent. If you lie on your back, put a pillow under your knees.  Avoid feeling anxious or stressed.Stress increases muscle tension and can worsen back  pain.It is important to recognize when you are anxious or stressed and learn ways to manage it, such as with exercise.  Take medicines only as directed by your health care provider. Over-the-counter medicines to reduce pain and inflammation are often the most helpful.Your health care provider may prescribe muscle relaxant drugs.These medicines help dull your pain so you can more quickly return to your normal activities and healthy exercise.  Apply ice to the injured area:  Put ice in a plastic bag.  Place a towel between your skin and the bag.  Leave the ice on for 20 minutes, 2-3 times a day for the first 2-3 days. After that, ice and heat may be alternated to reduce pain and spasms.  Maintain a healthy weight. Excess weight puts extra stress on your back and makes it difficult to maintain good posture. SEEK MEDICAL CARE IF:  You have pain that is not relieved with rest or medicine.  You have increasing pain going down into the legs or buttocks.  You have pain that does not improve in one week.  You have night pain.  You lose weight.  You have a fever or chills. SEEK IMMEDIATE MEDICAL CARE IF:   You develop new bowel or bladder control problems.  You have unusual weakness or numbness in your arms or legs.  You develop nausea or vomiting.  You develop abdominal pain.  You feel faint.   This information is not intended to replace advice given  to you by your health care provider. Make sure you discuss any questions you have with your health care provider.   Document Released: 01/06/2005 Document Revised: 01/27/2014 Document Reviewed: 05/10/2013 Elsevier Interactive Patient Education [employer].
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
## 2014-12-29 NOTE — ED Notes (Signed)
Pt able to ambulate independently 

## 2015-01-02 ENCOUNTER — Other Ambulatory Visit: Payer: BC Managed Care – PPO

## 2015-01-16 ENCOUNTER — Other Ambulatory Visit: Payer: Self-pay | Admitting: *Deleted

## 2015-01-16 MED ORDER — ALPRAZOLAM 1 MG PO TABS
ORAL_TABLET | ORAL | Status: DC
Start: 2015-01-16 — End: 2015-02-15

## 2015-01-16 NOTE — Telephone Encounter (Signed)
Patient requested. Printed and faxed to pharmacy.  

## 2015-02-15 ENCOUNTER — Other Ambulatory Visit: Payer: Self-pay | Admitting: *Deleted

## 2015-02-15 MED ORDER — ALPRAZOLAM 1 MG PO TABS
ORAL_TABLET | ORAL | Status: DC
Start: 2015-02-15 — End: 2015-03-15

## 2015-02-15 NOTE — Telephone Encounter (Signed)
Patient requested to be faxed to pharmacy 

## 2015-02-17 ENCOUNTER — Other Ambulatory Visit: Payer: Self-pay | Admitting: Internal Medicine

## 2015-03-15 ENCOUNTER — Other Ambulatory Visit: Payer: Self-pay

## 2015-03-15 MED ORDER — ALPRAZOLAM 1 MG PO TABS: ORAL_TABLET | ORAL | Status: DC

## 2015-03-30 ENCOUNTER — Encounter: Payer: Self-pay | Admitting: Internal Medicine

## 2015-03-30 ENCOUNTER — Ambulatory Visit: Payer: BC Managed Care – PPO | Admitting: Internal Medicine

## 2015-04-13 ENCOUNTER — Other Ambulatory Visit: Payer: Self-pay | Admitting: *Deleted

## 2015-04-13 MED ORDER — ALPRAZOLAM 1 MG PO TABS
ORAL_TABLET | ORAL | Status: DC
Start: 2015-04-13 — End: 2015-05-11

## 2015-04-13 NOTE — Telephone Encounter (Signed)
Patient requested. Faxed to pharmacy.  

## 2015-04-19 ENCOUNTER — Other Ambulatory Visit: Payer: Self-pay | Admitting: Internal Medicine

## 2015-04-19 DIAGNOSIS — L309 Dermatitis, unspecified: Secondary | ICD-10-CM

## 2015-04-19 MED ORDER — TRIAMCINOLONE ACETONIDE 0.5 % EX CREA: 1.0000 "application " | TOPICAL_CREAM | Freq: Two times a day (BID) | CUTANEOUS | Status: DC | PRN

## 2015-05-03 ENCOUNTER — Ambulatory Visit (INDEPENDENT_AMBULATORY_CARE_PROVIDER_SITE_OTHER): Payer: BC Managed Care – PPO | Admitting: Internal Medicine

## 2015-05-03 ENCOUNTER — Encounter: Payer: Self-pay | Admitting: Internal Medicine

## 2015-05-03 ENCOUNTER — Other Ambulatory Visit: Payer: Self-pay | Admitting: Internal Medicine

## 2015-05-03 VITALS — BP 140/90 | HR 82 | Temp 97.7°F | Resp 20 | Ht 65.0 in | Wt 170.0 lb

## 2015-05-03 DIAGNOSIS — Z1231 Encounter for screening mammogram for malignant neoplasm of breast: Secondary | ICD-10-CM

## 2015-05-03 DIAGNOSIS — E890 Postprocedural hypothyroidism: Secondary | ICD-10-CM | POA: Diagnosis not present

## 2015-05-03 DIAGNOSIS — Z23 Encounter for immunization: Secondary | ICD-10-CM | POA: Diagnosis not present

## 2015-05-03 DIAGNOSIS — E7800 Pure hypercholesterolemia, unspecified: Secondary | ICD-10-CM

## 2015-05-03 DIAGNOSIS — E1142 Type 2 diabetes mellitus with diabetic polyneuropathy: Secondary | ICD-10-CM

## 2015-05-03 DIAGNOSIS — H548 Legal blindness, as defined in USA: Secondary | ICD-10-CM | POA: Diagnosis not present

## 2015-05-03 DIAGNOSIS — F419 Anxiety disorder, unspecified: Secondary | ICD-10-CM | POA: Diagnosis not present

## 2015-05-03 DIAGNOSIS — I10 Essential (primary) hypertension: Secondary | ICD-10-CM | POA: Diagnosis not present

## 2015-05-03 DIAGNOSIS — Z636 Dependent relative needing care at home: Secondary | ICD-10-CM | POA: Diagnosis not present

## 2015-05-03 DIAGNOSIS — Z1239 Encounter for other screening for malignant neoplasm of breast: Secondary | ICD-10-CM | POA: Diagnosis not present

## 2015-05-03 MED ORDER — NAPROXEN 500 MG PO TABS: 500.0000 mg | ORAL_TABLET | Freq: Two times a day (BID) | ORAL | Status: DC

## 2015-05-03 NOTE — Progress Notes (Signed)
Patient ID: Toni Moses, female   DOB: Mar 21, 1960, 55 y.o.   MRN: [id number]   Location:  [facility] clinic Provider:  Conard Alvira L. Mariea Clonts, D.O., C.M.D.  Code Status: full code Goals of Care:  Advanced Directives 05/03/2015  Does patient have an advance directive? Yes  Type of Advance Directive [place]  Does patient want to make changes to advanced directive? No - Patient declined  Copy of advanced directive(s) in chart? Yes  Would patient like information on creating an advanced directive? -     Chief Complaint  Patient presents with  . Medical Management of Chronic Issues    HPI: Patient is a 55 y.o. female seen today for medical management of chronic diseases.    She says she is stressed b/c her husband has not gone to HD all week.  Monday didn't go due to constipation, Tues he said he had to go to the bathroom (said missed bus), didn't go yesterday and hadn't decided would he go today.  He's been overmedicating himself with miralax when she's gone and then having fecal incontinence all over the house.  Home health collected his urine sample. Then he said it didn't hurt no more.    Her vision has declined so much that she is now using the walking stick and has no vision in right and minimal vision in left.  She had a fall at work into a ditch when helping another pt into a [place] and that's when she started using the walking stick.    Forgets her meds some mornings.  She takes care of her husband, but he doesn't take care of himself.  He's demanding things of her as she's trying to get ready for work, but then he's been able to do some stuff on his own like getting breakfast  Muscle relaxer and naprosyn are for legs.  Uses the naprosyn as needed, not scheduled bid.  Using the muscle relaxer for her back.    Still smokes every now and then, but not like she used to.  Doesn't at work, then has 4 cigarettes at home.    Will have a couple drinks at bowling.  Doesn't drink  otherwise.    Past Medical History  Diagnosis Date  . Thyroid disease   . Hypercholesteremia   . Anxiety   . Lumbago   . Hypertension   . Enlargement of lymph nodes   . Benign neoplasm of lymph nodes   . Other atopic dermatitis and related conditions   . Other abnormal blood chemistry   . Unspecified schizophrenia, unspecified condition   . Bipolar I disorder, most recent episode (or current) unspecified   . Depression   . Myalgia and myositis, unspecified   . Hypothyroidism   . Diabetes mellitus type 2, controlled [facility])     Past Surgical History  Procedure Laterality Date  . Abdominal hysterectomy    . Thyroidectomy    . Appendectomy    . Eye surgery Right     september    Allergies  Allergen Reactions  . Citalopram Other (See Comments)    "made her feel bad", "foaming at the mouth"      Medication List       This list is accurate as of: 05/03/15 11:59 PM.  Always use your most recent med list.               acetaminophen 500 MG tablet  Commonly known as:  TYLENOL  Take 1 tablet (  500 mg total) by mouth every 6 (six) hours as needed.     ALPRAZolam 1 MG tablet  Commonly known as:  XANAX  take 1 tablet by mouth three times a day if needed     aspirin 325 MG tablet  Take 325 mg by mouth every 6 (six) hours as needed for moderate pain.     hydrocortisone 2.5 % cream  Apply topically 2 (two) times daily.     levothyroxine 150 MCG tablet  Commonly known as:  SYNTHROID, LEVOTHROID  take 1 tablet by mouth every morning     lisinopril 5 MG tablet  Commonly known as:  PRINIVIL,ZESTRIL  Take 1 tablet (5 mg total) by mouth daily.     methocarbamol 500 MG tablet  Commonly known as:  ROBAXIN  Take 1 tablet (500 mg total) by mouth 2 (two) times daily.     naproxen 500 MG tablet  Commonly known as:  NAPROSYN  Take 1 tablet (500 mg total) by mouth 2 (two) times daily with a meal. As needed     QUEtiapine 50 MG tablet  Commonly known as:  SEROQUEL  Take 1  tablet (50 mg total) by mouth at bedtime.     triamcinolone cream 0.5 %  Commonly known as:  KENALOG  Apply 1 application topically 2 (two) times daily as needed (for ezcema).        Review of Systems:  Review of Systems  Constitutional: Negative for fever, chills and malaise/fatigue.  HENT: Negative for congestion and hearing loss.   Eyes: Positive for blurred vision.  Respiratory: Negative for shortness of breath.   Cardiovascular: Negative for chest pain and leg swelling.  Gastrointestinal: Negative for abdominal pain, constipation, blood in stool and melena.  Genitourinary: Negative for dysuria, urgency and frequency.  Musculoskeletal: Positive for myalgias and back pain.       Leg pain  Neurological: Negative for dizziness, loss of consciousness and weakness.  Psychiatric/Behavioral: Positive for depression. Negative for hallucinations and memory loss. The patient is nervous/anxious and has insomnia.        Since taking medication    Health Maintenance  Topic Date Due  . Hepatitis C Screening  Jun 14, 1960  . HIV Screening  02/04/1975  . PAP SMEAR  01/21/2014  . OPHTHALMOLOGY EXAM  05/25/2014  . HEMOGLOBIN A1C  04/09/2015  . MAMMOGRAM  08/10/2015 (Originally 01/23/2011)  . DEXA SCAN  08/10/2015 (Originally 1960/07/08)  . INFLUENZA VACCINE  01/21/2048 (Originally 08/21/2015)  . FOOT EXAM  11/30/2015  . TETANUS/TDAP  01/22/2020  . PNEUMOCOCCAL POLYSACCHARIDE VACCINE (2) 05/02/2020  . COLONOSCOPY  07/14/2023    Physical Exam: Filed Vitals:   05/03/15 0756 05/03/15 0850  BP: 160/82 140/90  Pulse: 82   Temp: 97.7 F (36.5 C)   TempSrc: Oral   Resp: 20   Height: 5\' 5"  (1.651 m)   Weight: 170 lb (77.111 kg)   SpO2: 97%    Body mass index is 28.29 kg/(m^2). Physical Exam  Constitutional: She is oriented to person, place, and time. She appears well-developed and well-nourished. No distress.  Cardiovascular: Normal rate, regular rhythm, normal heart sounds and intact  distal pulses.   Pulmonary/Chest: Effort normal and breath sounds normal. No respiratory distress.  Abdominal: Soft. Bowel sounds are normal. She exhibits no distension. There is no tenderness.  Musculoskeletal: Normal range of motion.  Neurological: She is alert and oriented to person, place, and time.  Skin: Skin is warm and dry.  Psychiatric:  Anxious jittery  Labs reviewed: Basic Metabolic Panel:  Recent Labs  06/15/14 1616 10/10/14 1706 12/20/14 1918 05/03/15 0900  NA  --  145* 139 141  K  --  4.5 4.4 4.6  CL  --  107 107 102  CO2  --  24 27 24   GLUCOSE  --  90 93 89  BUN  --  11 10 12   CREATININE  --  0.89 1.12* 0.92  CALCIUM  --  9.8 9.2 9.4  TSH 13.640* 1.550  --  3.700   Liver Function Tests: No results for input(s): AST, ALT, ALKPHOS, BILITOT, PROT, ALBUMIN in the last 8760 hours. No results for input(s): LIPASE, AMYLASE in the last 8760 hours. No results for input(s): AMMONIA in the last 8760 hours. CBC:  Recent Labs  05/10/14 0928 10/10/14 1706 12/20/14 1918 05/03/15 0900  WBC 6.7 6.4 6.0 4.7  NEUTROABS  --  3.6  --  3.1  HGB 11.8*  --  11.6*  --   HCT 36.2 34.8 35.8* 38.6  MCV 84.6 81 86.3 85  PLT 346 374 322 355   Lipid Panel:  Recent Labs  05/03/15 0900  CHOL 210*  HDL 87  LDLCALC 103*  TRIG 100  CHOLHDL 2.4   Lab Results  Component Value Date   HGBA1C 6.0* 05/03/2015    Assessment/Plan 1. Caregiver stress -will try to get home health aide to assist with her caring for her husband due to pt's legal blindness--this should help  2. DM type 2 with diabetic peripheral neuropathy (HCC) -hba1c in prediabetic range  - due to seroquel most likely  3. Postoperative hypothyroidism - cont current synthroid, check tsh today  4. Hypercholesteremia - check lipids--NOTE she is NOT fasting (had cream and sugar in coffee)  5. Legal blindness -now using cane everywhere due to recent fall at work -is progressing and more help needed in the  home as she cares for her husband  6. Anxiety -cont xanax tid prn for this  7. Essential hypertension -bp elevated, but did improve on recheck after we discussed how she could be helped at home and before her pneumonia shot  8. Screening for breast cancer - MM DIGITAL SCREENING BILATERAL; Future  9.  Need for pneumovax:  Given today  Labs/tests ordered:   Orders Placed This Encounter  Procedures  . Pneumococcal polysaccharide vaccine 23-valent greater than or equal to 2yo subcutaneous/IM  . Basic Metabolic Panel  . Hemoglobin A1c  . Lipid Panel  . TSH  . CBC with Differential    Next appt:  3 mos for annual  Naphtali Riede L. Anijah Spohr, D.O. [facility] [address], [place] [address] Cell Phone (Mon-Fri 8am-5pm):  [phone] On Call:  [phone] & follow prompts after 5pm & weekends Office Phone:  [phone] Office Fax:  [phone]

## 2015-05-04 LAB — CBC WITH DIFFERENTIAL/PLATELET
Basophils Absolute: 0 10*3/uL (ref 0.0–0.2)
Basos: 1 %
EOS (ABSOLUTE): 0.2 10*3/uL (ref 0.0–0.4)
Eos: 4 %
Hematocrit: 38.6 % (ref 34.0–46.6)
Hemoglobin: 12.6 g/dL (ref 11.1–15.9)
Immature Grans (Abs): 0 10*3/uL (ref 0.0–0.1)
Immature Granulocytes: 0 %
Lymphocytes Absolute: 1 10*3/uL (ref 0.7–3.1)
Lymphs: 20 %
MCH: 27.6 pg (ref 26.6–33.0)
MCHC: 32.6 g/dL (ref 31.5–35.7)
MCV: 85 fL (ref 79–97)
Monocytes Absolute: 0.4 10*3/uL (ref 0.1–0.9)
Monocytes: 9 %
Neutrophils Absolute: 3.1 10*3/uL (ref 1.4–7.0)
Neutrophils: 66 %
Platelets: 355 10*3/uL (ref 150–379)
RBC: 4.57 x10E6/uL (ref 3.77–5.28)
RDW: 14.7 % (ref 12.3–15.4)
WBC: 4.7 10*3/uL (ref 3.4–10.8)

## 2015-05-04 LAB — BASIC METABOLIC PANEL
BUN/Creatinine Ratio: 13 (ref 9–23)
BUN: 12 mg/dL (ref 6–24)
CO2: 24 mmol/L (ref 18–29)
Calcium: 9.4 mg/dL (ref 8.7–10.2)
Chloride: 102 mmol/L (ref 96–106)
Creatinine, Ser: 0.92 mg/dL (ref 0.57–1.00)
GFR calc Af Amer: 81 mL/min/{1.73_m2} (ref >59)
GFR calc non Af Amer: 70 mL/min/{1.73_m2} (ref >59)
Glucose: 89 mg/dL (ref 65–99)
Potassium: 4.6 mmol/L (ref 3.5–5.2)
Sodium: 141 mmol/L (ref 134–144)

## 2015-05-04 LAB — LIPID PANEL
Chol/HDL Ratio: 2.4 ratio units (ref 0.0–4.4)
Cholesterol, Total: 210 mg/dL — ABNORMAL HIGH (ref 100–199)
HDL: 87 mg/dL (ref >39)
LDL Calculated: 103 mg/dL — ABNORMAL HIGH (ref 0–99)
Triglycerides: 100 mg/dL (ref 0–149)
VLDL Cholesterol Cal: 20 mg/dL (ref 5–40)

## 2015-05-04 LAB — HEMOGLOBIN A1C
Est. average glucose Bld gHb Est-mCnc: 126 mg/dL
Hgb A1c MFr Bld: 6 % — ABNORMAL HIGH (ref 4.8–5.6)

## 2015-05-04 LAB — TSH: TSH: 3.7 u[IU]/mL (ref 0.450–4.500)

## 2015-05-11 ENCOUNTER — Other Ambulatory Visit: Payer: Self-pay

## 2015-05-11 MED ORDER — ALPRAZOLAM 1 MG PO TABS: ORAL_TABLET | ORAL | Status: DC

## 2015-06-01 ENCOUNTER — Ambulatory Visit: Payer: BC Managed Care – PPO

## 2015-06-11 ENCOUNTER — Other Ambulatory Visit: Payer: Self-pay | Admitting: *Deleted

## 2015-06-11 MED ORDER — ALPRAZOLAM 1 MG PO TABS
ORAL_TABLET | ORAL | Status: DC
Start: 2015-06-11 — End: 2015-07-12

## 2015-06-11 NOTE — Telephone Encounter (Signed)
Patient requested. Printed and faxed to pharmacy.  

## 2015-07-09 ENCOUNTER — Other Ambulatory Visit: Payer: Self-pay

## 2015-07-09 ENCOUNTER — Encounter (HOSPITAL_COMMUNITY): Payer: Self-pay

## 2015-07-09 ENCOUNTER — Observation Stay (HOSPITAL_COMMUNITY)
Admission: EM | Admit: 2015-07-09 | Discharge: 2015-07-12 | Disposition: A | Discharge status: Home / Self Care | Payer: BLUE CROSS/BLUE SHIELD | Attending: General Surgery | Admitting: General Surgery

## 2015-07-09 ENCOUNTER — Emergency Department (HOSPITAL_COMMUNITY): Payer: BLUE CROSS/BLUE SHIELD

## 2015-07-09 ENCOUNTER — Inpatient Hospital Stay (HOSPITAL_COMMUNITY): Payer: BLUE CROSS/BLUE SHIELD

## 2015-07-09 DIAGNOSIS — F319 Bipolar disorder, unspecified: Secondary | ICD-10-CM | POA: Diagnosis not present

## 2015-07-09 DIAGNOSIS — E039 Hypothyroidism, unspecified: Secondary | ICD-10-CM | POA: Insufficient documentation

## 2015-07-09 DIAGNOSIS — F17210 Nicotine dependence, cigarettes, uncomplicated: Secondary | ICD-10-CM | POA: Insufficient documentation

## 2015-07-09 DIAGNOSIS — R079 Chest pain, unspecified: Secondary | ICD-10-CM | POA: Diagnosis present

## 2015-07-09 DIAGNOSIS — K8010 Calculus of gallbladder with chronic cholecystitis without obstruction: Principal | ICD-10-CM | POA: Insufficient documentation

## 2015-07-09 DIAGNOSIS — Z791 Long term (current) use of non-steroidal anti-inflammatories (NSAID): Secondary | ICD-10-CM | POA: Insufficient documentation

## 2015-07-09 DIAGNOSIS — H548 Legal blindness, as defined in USA: Secondary | ICD-10-CM | POA: Insufficient documentation

## 2015-07-09 DIAGNOSIS — Z79899 Other long term (current) drug therapy: Secondary | ICD-10-CM | POA: Diagnosis not present

## 2015-07-09 DIAGNOSIS — E1140 Type 2 diabetes mellitus with diabetic neuropathy, unspecified: Secondary | ICD-10-CM | POA: Diagnosis not present

## 2015-07-09 DIAGNOSIS — K819 Cholecystitis, unspecified: Secondary | ICD-10-CM | POA: Diagnosis present

## 2015-07-09 DIAGNOSIS — F419 Anxiety disorder, unspecified: Secondary | ICD-10-CM | POA: Diagnosis not present

## 2015-07-09 DIAGNOSIS — Z7982 Long term (current) use of aspirin: Secondary | ICD-10-CM | POA: Insufficient documentation

## 2015-07-09 DIAGNOSIS — K810 Acute cholecystitis: Secondary | ICD-10-CM

## 2015-07-09 DIAGNOSIS — I10 Essential (primary) hypertension: Secondary | ICD-10-CM | POA: Diagnosis not present

## 2015-07-09 DIAGNOSIS — K8042 Calculus of bile duct with acute cholecystitis without obstruction: Secondary | ICD-10-CM

## 2015-07-09 DIAGNOSIS — K8020 Calculus of gallbladder without cholecystitis without obstruction: Secondary | ICD-10-CM

## 2015-07-09 LAB — BASIC METABOLIC PANEL
ANION GAP: 5 (ref 5–15)
BUN: 9 mg/dL (ref 6–20)
CALCIUM: 9.6 mg/dL (ref 8.9–10.3)
CO2: 26 mmol/L (ref 22–32)
Chloride: 107 mmol/L (ref 101–111)
Creatinine, Ser: 1.19 mg/dL — ABNORMAL HIGH (ref 0.44–1.00)
GFR calc non Af Amer: 50 mL/min — ABNORMAL LOW (ref >60)
GFR, EST AFRICAN AMERICAN: 58 mL/min — AB (ref >60)
Glucose, Bld: 122 mg/dL — ABNORMAL HIGH (ref 65–99)
Potassium: 4.2 mmol/L (ref 3.5–5.1)
Sodium: 138 mmol/L (ref 135–145)

## 2015-07-09 LAB — CBC
HCT: 36.5 % (ref 36.0–46.0)
HEMOGLOBIN: 11.7 g/dL — AB (ref 12.0–15.0)
MCH: 26.9 pg (ref 26.0–34.0)
MCHC: 32.1 g/dL (ref 30.0–36.0)
MCV: 83.9 fL (ref 78.0–100.0)
Platelets: 305 10*3/uL (ref 150–400)
RBC: 4.35 MIL/uL (ref 3.87–5.11)
RDW: 17.6 % — ABNORMAL HIGH (ref 11.5–15.5)
WBC: 6.1 10*3/uL (ref 4.0–10.5)

## 2015-07-09 LAB — HEPATIC FUNCTION PANEL
ALBUMIN: 3.5 g/dL (ref 3.5–5.0)
ALT: 18 U/L (ref 14–54)
AST: 28 U/L (ref 15–41)
Alkaline Phosphatase: 90 U/L (ref 38–126)
BILIRUBIN TOTAL: 0.6 mg/dL (ref 0.3–1.2)
Bilirubin, Direct: 0.2 mg/dL (ref 0.1–0.5)
Indirect Bilirubin: 0.4 mg/dL (ref 0.3–0.9)
Total Protein: 7.5 g/dL (ref 6.5–8.1)

## 2015-07-09 LAB — I-STAT TROPONIN, ED: TROPONIN I, POC: 0 ng/mL (ref 0.00–0.08)

## 2015-07-09 LAB — LIPASE, BLOOD: Lipase: 44 U/L (ref 11–51)

## 2015-07-09 MED ORDER — GADOBENATE DIMEGLUMINE 529 MG/ML IV SOLN
15.0000 mL | Freq: Once | INTRAVENOUS | Status: AC | PRN
  Administered 2015-07-09: 15 mL via INTRAVENOUS

## 2015-07-09 MED ORDER — MORPHINE SULFATE (PF) 4 MG/ML IV SOLN
4.0000 mg | Freq: Once | INTRAVENOUS | Status: AC
  Administered 2015-07-09: 4 mg via INTRAVENOUS
  Filled 2015-07-09: qty 1

## 2015-07-09 MED ORDER — ONDANSETRON HCL 4 MG/2ML IJ SOLN
4.0000 mg | Freq: Once | INTRAMUSCULAR | Status: AC
  Administered 2015-07-09: 4 mg via INTRAVENOUS
  Filled 2015-07-09: qty 2

## 2015-07-09 NOTE — ED Notes (Signed)
Patient called for room 3X with no response 

## 2015-07-09 NOTE — ED Notes (Signed)
Pt transported to US

## 2015-07-09 NOTE — H&P (Signed)
Toni Moses is an 55 y.o. female.   Chief Complaint: ruq pain HPI: 21 yof who states this is first episode of ruq pain that started yesterday. It appears reviewing chart that she was evaluated in 2012 with wes sign on her gb US though.  She states she has ruq pain radiation straight to back since yesterday. Not getting better. Not eating. Nausea but no fevers.  She has undergone [place] that shows stones and dilated cbd (certainly larger than last [place] in 2012 and abnl). Her lfts are normal  Past Medical History  Diagnosis Date  . Thyroid disease   . Hypercholesteremia   . Anxiety   . Lumbago   . Hypertension   . Enlargement of lymph nodes   . Benign neoplasm of lymph nodes   . Other atopic dermatitis and related conditions   . Other abnormal blood chemistry   . Unspecified schizophrenia, unspecified condition   . Bipolar I disorder, most recent episode (or current) unspecified   . Depression   . Myalgia and myositis, unspecified   . Hypothyroidism   . Diabetes mellitus type 2, controlled [facility])     Past Surgical History  Procedure Laterality Date  . Abdominal hysterectomy    . Thyroidectomy    . Appendectomy    . Eye surgery Right     september   She also states she had hh repair in [place]  Family History  Problem Relation Age of Onset  . Heart disease Other    Social History:  reports that she has been smoking Cigarettes.  She does not have any smokeless tobacco history on file. She reports that she does not drink alcohol or use illicit drugs.  Allergies:  Allergies  Allergen Reactions  . Citalopram Other (See Comments)    "made her feel bad", "foaming at the mouth"   meds reviewed   Results for orders placed or performed during the hospital encounter of 07/09/15 (from the past 48 hour(s))  Basic metabolic panel     Status: Abnormal   Collection Time: 07/09/15  1:20 PM  Result Value Ref Range   Sodium 138 135 - 145 mmol/L   Potassium 4.2 3.5 - 5.1 mmol/L   Chloride 107 101 - 111 mmol/L   CO2 26 22 - 32 mmol/L   Glucose, Bld 122 (H) 65 - 99 mg/dL   BUN 9 6 - 20 mg/dL   Creatinine, Ser 1.19 (H) 0.44 - 1.00 mg/dL   Calcium 9.6 8.9 - 10.3 mg/dL   GFR calc non Af Amer 50 (L) >60 mL/min   GFR calc Af Amer 58 (L) >60 mL/min    Comment: (NOTE) The eGFR has been calculated using the CKD EPI equation. This calculation has not been validated in all clinical situations. eGFR's persistently <60 mL/min signify possible Chronic Kidney Disease.    Anion gap 5 5 - 15  CBC     Status: Abnormal   Collection Time: 07/09/15  1:20 PM  Result Value Ref Range   WBC 6.1 4.0 - 10.5 K/uL   RBC 4.35 3.87 - 5.11 MIL/uL   Hemoglobin 11.7 (L) 12.0 - 15.0 g/dL   HCT 36.5 36.0 - 46.0 %   MCV 83.9 78.0 - 100.0 fL   MCH 26.9 26.0 - 34.0 pg   MCHC 32.1 30.0 - 36.0 g/dL   RDW 17.6 (H) 11.5 - 15.5 %   Platelets 305 150 - 400 K/uL  I-stat troponin, ED     Status: None  Collection Time: 07/09/15  1:30 PM  Result Value Ref Range   Troponin i, poc 0.00 0.00 - 0.08 ng/mL   Comment 3            Comment: Due to the release kinetics of cTnI, a negative result within the first hours of the onset of symptoms does not rule out myocardial infarction with certainty. If myocardial infarction is still suspected, repeat the test at appropriate intervals.   Hepatic function panel     Status: None   Collection Time: 07/09/15  6:20 PM  Result Value Ref Range   Total Protein 7.5 6.5 - 8.1 g/dL   Albumin 3.5 3.5 - 5.0 g/dL   AST 28 15 - 41 U/L   ALT 18 14 - 54 U/L   Alkaline Phosphatase 90 38 - 126 U/L   Total Bilirubin 0.6 0.3 - 1.2 mg/dL   Bilirubin, Direct 0.2 0.1 - 0.5 mg/dL   Indirect Bilirubin 0.4 0.3 - 0.9 mg/dL  Lipase, blood     Status: None   Collection Time: 07/09/15  6:20 PM  Result Value Ref Range   Lipase 44 11 - 51 U/L   Dg Chest 2 View  07/09/2015  CLINICAL DATA:  Chest pain since last night, worsening today EXAM: CHEST  2 VIEW COMPARISON:  Chest x-ray  and chest CT 12/20/2014 FINDINGS: Linear scarring at the left base. 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 No confluent airspace opacity or effusion. Heart is normal size. No acute bony abnormality. IMPRESSION: No active cardiopulmonary disease. Electronically Signed   By: Rolm Baptise M.D.   On: 07/09/2015 14:03   US Abdomen Limited  07/09/2015  CLINICAL DATA:  Acute onset of right upper quadrant abdominal pain. Initial encounter. EXAM: US ABDOMEN LIMITED - RIGHT UPPER QUADRANT COMPARISON:  Abdominal ultrasound performed 09/03/2010, and CT of the chest, abdomen and pelvis performed 11/27/2011 FINDINGS: Gallbladder: Stones are seen dependently within the gallbladder, measuring up to 7 mm. Gallbladder wall thickening is noted, measuring up to 6 mm. No pericholecystic fluid is seen. No ultrasonographic Murphy's sign is elicited. Common bile duct: Diameter: 1.4 cm, significantly dilated. Liver: A small 1.6 cm hyperechoic focus within the inferior right hepatic lobe likely reflects a small hemangioma. The liver is otherwise unremarkable in appearance, demonstrating normal parenchymal echogenicity. A small right renal cyst is noted. IMPRESSION: 1. Cholelithiasis, with diffuse gallbladder wall thickening and a significantly dilated common bile duct. This raises concern for distal obstruction. MRCP or ERCP would be helpful for further evaluation. 2. 1.6 cm hyperechoic focus within the inferior right hepatic lobe likely reflects a small hemangioma. 3. Small right renal cyst noted. Electronically Signed   By: Garald Balding M.D.   On: 07/09/2015 19:35    Review of Systems  Constitutional: Negative for fever and chills.  Respiratory: Negative for cough and shortness of breath.   Cardiovascular: Negative for chest pain.  Gastrointestinal: Positive for nausea and abdominal pain. Negative for vomiting and constipation.  Musculoskeletal: Positive for back pain.    Blood pressure 164/95, pulse 55, temperature 98  F (36.7 C), temperature source Oral, resp. rate 9, height '5\' 5"'  (1.651 m), weight 77.111 kg (170 lb), SpO2 99 %. Physical Exam  Vitals reviewed. Constitutional: She is oriented to person, place, and time. She appears well-developed and well-nourished.  Eyes: No scleral icterus.  Neck: Neck supple.  Cardiovascular: Normal rate, regular rhythm and normal heart sounds.   Respiratory: Effort normal and breath sounds normal. She has no wheezes.  She has no rales.  GI: Soft. There is tenderness. There is positive Murphy's sign.  Neurological: She is alert and oriented to person, place, and time.     Assessment/Plan Cholecystitis, ? Stone passed  I think could either get mrcp or proceed with ioc. Will attempt to get mrcp tonight due to bile duct dilation.  Recheck lfts in am.  Recommended lap chole possible cholangiogram to her and discussed procedure. Will place on abx as I think she has cholecystitis by her exam tonight. Jolyne Loa, MD 07/09/2015, 8:57 PM

## 2015-07-09 NOTE — ED Notes (Signed)
Attempted to call report

## 2015-07-09 NOTE — ED Notes (Signed)
Pt up to nurse first desk asking where she was on the list. Pt was informed her name was called multiple times with no answer. Pt stats she was asleep and did not hear her name called.

## 2015-07-09 NOTE — ED Notes (Signed)
Patient complains of chest pain and epigastric pain with radiation to thoracic area since 0600. Pain worse with movement

## 2015-07-09 NOTE — ED Notes (Signed)
Patient transported to MRI 

## 2015-07-09 NOTE — ED Provider Notes (Signed)
CSN: [id number]     Arrival date & time 07/09/15  1305 History   First MD Initiated Contact with Patient 07/09/15 1746     Chief Complaint  Patient presents with  . Chest Pain  . epigastric pain      (Consider location/radiation/quality/duration/timing/severity/associated sxs/prior Treatment) HPI Comments: Patient presents to the emergency department with chief complaint of right upper quadrant abdominal and epigastric pain that radiates to her chest. She states that the symptoms started last night. She reports that they have persisted throughout the day today. She reports increased pain and discomfort when she eats as well as with palpation. She states that she has had associated nausea and diarrhea, but denies any vomiting. She denies any fevers or chills. Prior abdominal surgeries include abdominal hysterectomy and appendectomy. She denies any associated shortness of breath or cough. There are no other modifying factors.  The history is provided by the patient. No language interpreter was used.    Past Medical History  Diagnosis Date  . Thyroid disease   . Hypercholesteremia   . Anxiety   . Lumbago   . Hypertension   . Enlargement of lymph nodes   . Benign neoplasm of lymph nodes   . Other atopic dermatitis and related conditions   . Other abnormal blood chemistry   . Unspecified schizophrenia, unspecified condition   . Bipolar I disorder, most recent episode (or current) unspecified   . Depression   . Myalgia and myositis, unspecified   . Hypothyroidism   . Diabetes mellitus type 2, controlled [facility])    Past Surgical History  Procedure Laterality Date  . Abdominal hysterectomy    . Thyroidectomy    . Appendectomy    . Eye surgery Right     september   Family History  Problem Relation Age of Onset  . Heart disease Other    Social History  Substance Use Topics  . Smoking status: Current Every Day Smoker    Types: Cigarettes  . Smokeless tobacco: None  . Alcohol  Use: No   OB History    No data available     Review of Systems  Constitutional: Negative for fever and chills.  Respiratory: Negative for shortness of breath.   Cardiovascular: Negative for chest pain.  Gastrointestinal: Positive for nausea, abdominal pain and diarrhea. Negative for vomiting and constipation.  Genitourinary: Negative for dysuria.  All other systems reviewed and are negative.     Allergies  Citalopram  Home Medications   Prior to Admission medications   Medication Sig Start Date End Date Taking? Authorizing Provider  acetaminophen (TYLENOL) 500 MG tablet Take 1 tablet (500 mg total) by mouth every 6 (six) hours as needed. 12/21/14   Gloriann Loan, PA-C  ALPRAZolam Duanne Moron) 1 MG tablet take 1 tablet by mouth three times a day if needed 06/11/15   Gayland Curry, DO  aspirin 325 MG tablet Take 325 mg by mouth every 6 (six) hours as needed for moderate pain.    Historical Provider, MD  hydrocortisone 2.5 % cream Apply topically 2 (two) times daily. 12/19/14   Tiffany L Reed, DO  levothyroxine (SYNTHROID, LEVOTHROID) 150 MCG tablet take 1 tablet by mouth every morning 02/19/15   Tiffany L Reed, DO  lisinopril (PRINIVIL,ZESTRIL) 5 MG tablet Take 1 tablet (5 mg total) by mouth daily. 10/10/14   Tiffany L Reed, DO  methocarbamol (ROBAXIN) 500 MG tablet Take 1 tablet (500 mg total) by mouth 2 (two) times daily. 12/29/14   Lattie Haw  Chalmers Guest, PA-C  naproxen (NAPROSYN) 500 MG tablet Take 1 tablet (500 mg total) by mouth 2 (two) times daily with a meal. As needed 05/03/15   Tiffany L Reed, DO  QUEtiapine (SEROQUEL) 50 MG tablet Take 1 tablet (50 mg total) by mouth at bedtime. 11/30/14   Tiffany L Reed, DO  triamcinolone cream (KENALOG) 0.5 % Apply 1 application topically 2 (two) times daily as needed (for ezcema). 04/19/15   Tiffany L Reed, DO   BP 147/105 mmHg  Pulse 76  Temp(Src) 98 F (36.7 C) (Oral)  Resp 18  Ht 5\' 5"  (1.651 m)  Wt 77.111 kg  BMI 28.29 kg/m2  SpO2 100% Physical  Exam  Constitutional: She is oriented to person, place, and time. She appears well-developed and well-nourished.  HENT:  Head: Normocephalic and atraumatic.  Eyes: Conjunctivae and EOM are normal. Pupils are equal, round, and reactive to light.  Neck: Normal range of motion. Neck supple.  Cardiovascular: Normal rate and regular rhythm.  Exam reveals no gallop and no friction rub.   No murmur heard. Pulmonary/Chest: Effort normal and breath sounds normal. No respiratory distress. She has no wheezes. She has no rales. She exhibits no tenderness.  CTAB  Abdominal: Soft. Bowel sounds are normal. She exhibits no distension and no mass. There is no tenderness. There is no rebound and no guarding.  Significant tenderness to palpation of right upper quadrant and epigastrium no other focal abdominal tenderness  Musculoskeletal: Normal range of motion. She exhibits no edema or tenderness.  Neurological: She is alert and oriented to person, place, and time.  Skin: Skin is warm and dry.  Psychiatric: She has a normal mood and affect. Her behavior is normal. Judgment and thought content normal.  Nursing note and vitals reviewed.   ED Course  Procedures (including critical care time) Results for orders placed or performed during the hospital encounter of 0000000  Basic metabolic panel  Result Value Ref Range   Sodium 138 135 - 145 mmol/L   Potassium 4.2 3.5 - 5.1 mmol/L   Chloride 107 101 - 111 mmol/L   CO2 26 22 - 32 mmol/L   Glucose, Bld 122 (H) 65 - 99 mg/dL   BUN 9 6 - 20 mg/dL   Creatinine, Ser 1.19 (H) 0.44 - 1.00 mg/dL   Calcium 9.6 8.9 - 10.3 mg/dL   GFR calc non Af Amer 50 (L) >60 mL/min   GFR calc Af Amer 58 (L) >60 mL/min   Anion gap 5 5 - 15  CBC  Result Value Ref Range   WBC 6.1 4.0 - 10.5 K/uL   RBC 4.35 3.87 - 5.11 MIL/uL   Hemoglobin 11.7 (L) 12.0 - 15.0 g/dL   HCT 36.5 36.0 - 46.0 %   MCV 83.9 78.0 - 100.0 fL   MCH 26.9 26.0 - 34.0 pg   MCHC 32.1 30.0 - 36.0 g/dL   RDW  17.6 (H) 11.5 - 15.5 %   Platelets 305 150 - 400 K/uL  Hepatic function panel  Result Value Ref Range   Total Protein 7.5 6.5 - 8.1 g/dL   Albumin 3.5 3.5 - 5.0 g/dL   AST 28 15 - 41 U/L   ALT 18 14 - 54 U/L   Alkaline Phosphatase 90 38 - 126 U/L   Total Bilirubin 0.6 0.3 - 1.2 mg/dL   Bilirubin, Direct 0.2 0.1 - 0.5 mg/dL   Indirect Bilirubin 0.4 0.3 - 0.9 mg/dL  Lipase, blood  Result Value Ref  Range   Lipase 44 11 - 51 U/L  I-stat troponin, ED  Result Value Ref Range   Troponin i, poc 0.00 0.00 - 0.08 ng/mL   Comment 3           Dg Chest 2 View  07/09/2015  CLINICAL DATA:  Chest pain since last night, worsening today EXAM: CHEST  2 VIEW COMPARISON:  Chest x-ray and chest CT 12/20/2014 FINDINGS: Linear scarring at the left base. 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 No confluent airspace opacity or effusion. Heart is normal size. No acute bony abnormality. IMPRESSION: No active cardiopulmonary disease. Electronically Signed   By: Rolm Baptise M.D.   On: 07/09/2015 14:03   US Abdomen Limited  07/09/2015  CLINICAL DATA:  Acute onset of right upper quadrant abdominal pain. Initial encounter. EXAM: US ABDOMEN LIMITED - RIGHT UPPER QUADRANT COMPARISON:  Abdominal ultrasound performed 09/03/2010, and CT of the chest, abdomen and pelvis performed 11/27/2011 FINDINGS: Gallbladder: Stones are seen dependently within the gallbladder, measuring up to 7 mm. Gallbladder wall thickening is noted, measuring up to 6 mm. No pericholecystic fluid is seen. No ultrasonographic Murphy's sign is elicited. Common bile duct: Diameter: 1.4 cm, significantly dilated. Liver: A small 1.6 cm hyperechoic focus within the inferior right hepatic lobe likely reflects a small hemangioma. The liver is otherwise unremarkable in appearance, demonstrating normal parenchymal echogenicity. A small right renal cyst is noted. IMPRESSION: 1. Cholelithiasis, with diffuse gallbladder wall thickening and a significantly dilated  common bile duct. This raises concern for distal obstruction. MRCP or ERCP would be helpful for further evaluation. 2. 1.6 cm hyperechoic focus within the inferior right hepatic lobe likely reflects a small hemangioma. 3. Small right renal cyst noted. Electronically Signed   By: Garald Balding M.D.   On: 07/09/2015 19:35     I have personally reviewed and evaluated these images and lab results as part of my medical decision-making.   MDM   Final diagnoses:  Symptomatic cholelithiasis    Patient with right upper quadrant and epigastric abdominal pain. The pain radiates to her chest. She has had nausea and diarrhea. Eating food makes her symptoms worse along with palpation. Labs ordered in triage are unremarkable, will add LFTs and lipase, will also check right upper quadrant ultrasound and treat pain.  Patient is upset about her weight time. I apologize profusely, and give the patient an additional warm blanket.  Ultrasound remarkable for as above. Will consult with general surgery.  Patient discussed with Dr. Ashok Cordia, who agrees with the plan.  8:00 PM Appreciate Dr. Donne Hazel for coming to see the patient.  Montine Circle, PA-C 07/09/15 2001  Lajean Saver, MD 07/09/15 [phone]

## 2015-07-10 LAB — CBC
HCT: 35.6 % — ABNORMAL LOW (ref 36.0–46.0)
Hemoglobin: 11.3 g/dL — ABNORMAL LOW (ref 12.0–15.0)
MCH: 26.6 pg (ref 26.0–34.0)
MCHC: 31.7 g/dL (ref 30.0–36.0)
MCV: 83.8 fL (ref 78.0–100.0)
PLATELETS: 292 10*3/uL (ref 150–400)
RBC: 4.25 MIL/uL (ref 3.87–5.11)
RDW: 17.9 % — ABNORMAL HIGH (ref 11.5–15.5)
WBC: 5.7 10*3/uL (ref 4.0–10.5)

## 2015-07-10 LAB — COMPREHENSIVE METABOLIC PANEL
ALBUMIN: 3.2 g/dL — AB (ref 3.5–5.0)
ALK PHOS: 87 U/L (ref 38–126)
ALT: 16 U/L (ref 14–54)
AST: 24 U/L (ref 15–41)
Anion gap: 6 (ref 5–15)
BUN: 8 mg/dL (ref 6–20)
CALCIUM: 8.8 mg/dL — AB (ref 8.9–10.3)
CO2: 25 mmol/L (ref 22–32)
CREATININE: 1.1 mg/dL — AB (ref 0.44–1.00)
Chloride: 107 mmol/L (ref 101–111)
GFR calc Af Amer: >60 mL/min (ref >60)
GFR calc non Af Amer: 55 mL/min — ABNORMAL LOW (ref >60)
GLUCOSE: 86 mg/dL (ref 65–99)
Potassium: 3.7 mmol/L (ref 3.5–5.1)
SODIUM: 138 mmol/L (ref 135–145)
Total Bilirubin: 0.5 mg/dL (ref 0.3–1.2)
Total Protein: 6.9 g/dL (ref 6.5–8.1)

## 2015-07-10 LAB — SURGICAL PCR SCREEN
MRSA, PCR: NEGATIVE
Staphylococcus aureus: NEGATIVE

## 2015-07-10 MED ORDER — HEPARIN SODIUM (PORCINE) 5000 UNIT/ML IJ SOLN
5000.0000 [IU] | Freq: Three times a day (TID) | INTRAMUSCULAR | Status: DC
  Administered 2015-07-10 – 2015-07-12 (×5): 5000 [IU] via SUBCUTANEOUS
  Filled 2015-07-10 (×5): qty 1

## 2015-07-10 MED ORDER — HYDROMORPHONE HCL 1 MG/ML IJ SOLN
1.0000 mg | INTRAMUSCULAR | Status: DC | PRN
  Administered 2015-07-10 – 2015-07-12 (×10): 1 mg via INTRAVENOUS
  Filled 2015-07-10 (×10): qty 1

## 2015-07-10 MED ORDER — ACETAMINOPHEN 325 MG PO TABS: 650.0000 mg | ORAL_TABLET | Freq: Four times a day (QID) | ORAL | Status: DC | PRN

## 2015-07-10 MED ORDER — SODIUM CHLORIDE 0.9 % IV SOLN
INTRAVENOUS | Status: DC
  Administered 2015-07-10 (×2): via INTRAVENOUS
  Administered 2015-07-12: 100 mL/h via INTRAVENOUS

## 2015-07-10 MED ORDER — METHOCARBAMOL 500 MG PO TABS
500.0000 mg | ORAL_TABLET | Freq: Four times a day (QID) | ORAL | Status: DC | PRN
  Administered 2015-07-12: 500 mg via ORAL
  Filled 2015-07-10: qty 1

## 2015-07-10 MED ORDER — ACETAMINOPHEN 650 MG RE SUPP: 650.0000 mg | Freq: Four times a day (QID) | RECTAL | Status: DC | PRN

## 2015-07-10 MED ORDER — ONDANSETRON 4 MG PO TBDP: 4.0000 mg | ORAL_TABLET | Freq: Four times a day (QID) | ORAL | Status: DC | PRN

## 2015-07-10 MED ORDER — ONDANSETRON HCL 4 MG/2ML IJ SOLN
4.0000 mg | Freq: Four times a day (QID) | INTRAMUSCULAR | Status: DC | PRN
Start: 2015-07-10 — End: 2015-07-12

## 2015-07-10 MED ORDER — CETYLPYRIDINIUM CHLORIDE 0.05 % MT LIQD
7.0000 mL | Freq: Two times a day (BID) | OROMUCOSAL | Status: DC
  Administered 2015-07-10 – 2015-07-11 (×3): 7 mL via OROMUCOSAL

## 2015-07-10 MED ORDER — DEXTROSE 5 % IV SOLN
2.0000 g | INTRAVENOUS | Status: DC
  Administered 2015-07-10 – 2015-07-12 (×3): 2 g via INTRAVENOUS
  Filled 2015-07-10 (×3): qty 2

## 2015-07-10 NOTE — Progress Notes (Signed)
Patient ID: Toni Moses, female   DOB: 07-12-1960, 55 y.o.   MRN: [id number]     [facility]      [address] [place], [place] [address]    Phone: [phone] FAX: [phone]     Subjective: Still with pain. No n/v. lfts normal. mrcp without evidence of cbd stone.  Vss.  Afebrile.   Objective:  Vital signs:  Filed Vitals:   07/09/15 2215 07/10/15 0029 07/10/15 0524 07/10/15 0900  BP: 107/74 147/84 133/58 142/121  Pulse: 63 56 56 59  Temp:  98 F (36.7 C) 97.7 F (36.5 C) 98.4 F (36.9 C)  TempSrc:  Oral Oral Oral  Resp: '17 16 16   ' Height:  '5\' 5"'  (1.651 m)    Weight:  82.328 kg (181 lb 8 oz)    SpO2: 99% 98% 97% 100%    Last BM Date: 07/09/15  Intake/Output   Yesterday:  06/19 0701 - 06/20 0700 In: 1 [P.O.:1] Out: -  This shift: I/O last 3 completed shifts: In: 1 [P.O.:1] Out: -     Physical Exam: General: Pt awake/alert/oriented x4 in no acute distress Chest: cta.  No chest wall pain w good excursion CV:  Pulses intact.  Regular rhythm MS: Normal AROM mjr joints.  No obvious deformity Abdomen: Soft.  Nondistended. TTP RUQ.   No evidence of peritonitis.  No incarcerated hernias. Ext:  SCDs BLE.  No mjr edema.  No cyanosis Skin: No petechiae / purpura   Problem List:   Active Problems:   Cholecystitis    Results:   Labs: Results for orders placed or performed during the hospital encounter of 07/09/15 (from the past 48 hour(s))  Basic metabolic panel     Status: Abnormal   Collection Time: 07/09/15  1:20 PM  Result Value Ref Range   Sodium 138 135 - 145 mmol/L   Potassium 4.2 3.5 - 5.1 mmol/L   Chloride 107 101 - 111 mmol/L   CO2 26 22 - 32 mmol/L   Glucose, Bld 122 (H) 65 - 99 mg/dL   BUN 9 6 - 20 mg/dL   Creatinine, Ser 1.19 (H) 0.44 - 1.00 mg/dL   Calcium 9.6 8.9 - 10.3 mg/dL   GFR calc non Af Amer 50 (L) >60 mL/min   GFR calc Af Amer 58 (L) >60 mL/min    Comment: (NOTE) The eGFR has  been calculated using the CKD EPI equation. This calculation has not been validated in all clinical situations. eGFR's persistently <60 mL/min signify possible Chronic Kidney Disease.    Anion gap 5 5 - 15  CBC     Status: Abnormal   Collection Time: 07/09/15  1:20 PM  Result Value Ref Range   WBC 6.1 4.0 - 10.5 K/uL   RBC 4.35 3.87 - 5.11 MIL/uL   Hemoglobin 11.7 (L) 12.0 - 15.0 g/dL   HCT 36.5 36.0 - 46.0 %   MCV 83.9 78.0 - 100.0 fL   MCH 26.9 26.0 - 34.0 pg   MCHC 32.1 30.0 - 36.0 g/dL   RDW 17.6 (H) 11.5 - 15.5 %   Platelets 305 150 - 400 K/uL  I-stat troponin, ED     Status: None   Collection Time: 07/09/15  1:30 PM  Result Value Ref Range   Troponin i, poc 0.00 0.00 - 0.08 ng/mL   Comment 3            Comment: Due to the release  kinetics of cTnI, a negative result within the first hours of the onset of symptoms does not rule out myocardial infarction with certainty. If myocardial infarction is still suspected, repeat the test at appropriate intervals.   Hepatic function panel     Status: None   Collection Time: 07/09/15  6:20 PM  Result Value Ref Range   Total Protein 7.5 6.5 - 8.1 g/dL   Albumin 3.5 3.5 - 5.0 g/dL   AST 28 15 - 41 U/L   ALT 18 14 - 54 U/L   Alkaline Phosphatase 90 38 - 126 U/L   Total Bilirubin 0.6 0.3 - 1.2 mg/dL   Bilirubin, Direct 0.2 0.1 - 0.5 mg/dL   Indirect Bilirubin 0.4 0.3 - 0.9 mg/dL  Lipase, blood     Status: None   Collection Time: 07/09/15  6:20 PM  Result Value Ref Range   Lipase 44 11 - 51 U/L  Comprehensive metabolic panel     Status: Abnormal   Collection Time: 07/10/15  5:10 AM  Result Value Ref Range   Sodium 138 135 - 145 mmol/L   Potassium 3.7 3.5 - 5.1 mmol/L   Chloride 107 101 - 111 mmol/L   CO2 25 22 - 32 mmol/L   Glucose, Bld 86 65 - 99 mg/dL   BUN 8 6 - 20 mg/dL   Creatinine, Ser 1.10 (H) 0.44 - 1.00 mg/dL   Calcium 8.8 (L) 8.9 - 10.3 mg/dL   Total Protein 6.9 6.5 - 8.1 g/dL   Albumin 3.2 (L) 3.5 - 5.0 g/dL    AST 24 15 - 41 U/L   ALT 16 14 - 54 U/L   Alkaline Phosphatase 87 38 - 126 U/L   Total Bilirubin 0.5 0.3 - 1.2 mg/dL   GFR calc non Af Amer 55 (L) >60 mL/min   GFR calc Af Amer >60 >60 mL/min    Comment: (NOTE) The eGFR has been calculated using the CKD EPI equation. This calculation has not been validated in all clinical situations. eGFR's persistently <60 mL/min signify possible Chronic Kidney Disease.    Anion gap 6 5 - 15  CBC     Status: Abnormal   Collection Time: 07/10/15  5:10 AM  Result Value Ref Range   WBC 5.7 4.0 - 10.5 K/uL   RBC 4.25 3.87 - 5.11 MIL/uL   Hemoglobin 11.3 (L) 12.0 - 15.0 g/dL   HCT 35.6 (L) 36.0 - 46.0 %   MCV 83.8 78.0 - 100.0 fL   MCH 26.6 26.0 - 34.0 pg   MCHC 31.7 30.0 - 36.0 g/dL   RDW 17.9 (H) 11.5 - 15.5 %   Platelets 292 150 - 400 K/uL    Imaging / Studies: Dg Chest 2 View  07/09/2015  CLINICAL DATA:  Chest pain since last night, worsening today EXAM: CHEST  2 VIEW COMPARISON:  Chest x-ray and chest CT 12/20/2014 FINDINGS: Linear scarring at the left base. 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 No confluent airspace opacity or effusion. Heart is normal size. No acute bony abnormality. IMPRESSION: No active cardiopulmonary disease. Electronically Signed   By: Rolm Baptise M.D.   On: 07/09/2015 14:03   Mr 3d Recon At Scanner  07/10/2015  CLINICAL DATA:  55 year old female with right upper quadrant abdominal pain and nausea. EXAM: MRI ABDOMEN WITHOUT AND WITH CONTRAST (INCLUDING MRCP) TECHNIQUE: Multiplanar multisequence MR imaging of the abdomen was performed both before and after the administration of intravenous contrast. Heavily T2-weighted images of the biliary  and pancreatic ducts were obtained, and three-dimensional MRCP images were rendered by post processing. CONTRAST:  22m MULTIHANCE GADOBENATE DIMEGLUMINE 529 MG/ML IV SOLN COMPARISON:  Stop right upper quadrant ultrasound dated 07/09/2015 FINDINGS: Evaluation of this exam is limited  due to respiratory motion artifact. There is a 11 mm low T1 high T2 signal intensity lesion in the right lobe of the liver posteriorly. This lesion demonstrates an irregular and nodular peripheral enhancement and most compatible with a hemangioma. A subcentimeter low T1/high T2 subcapsular lesion in the right lobe of the liver inferiorly does not demonstrate enhancement and represents a cyst. There are multiple stones within the gallbladder. There is enhancement of the gallbladder mucosa. There is diffuse edema and thickening of the gallbladder wall. No significant pericholecystic fluid identified. There is focal dilatation of the common bile duct measuring up to 14 cm. There is normal tapering of the CBD at the head of the pancreas. No obstructing stone or lesions identified. The pancreas appears unremarkable with normal enhancement pattern. A 6 mm high T2/low T1 signal intensity in the proximal body of the pancreas with no enhancement likely represents a side branch IPMN retention cyst. There is no dilatation of the main pancreatic duct. The spleen, and the left adrenal gland appear unremarkable. There is a 1.5 cm nodule in the right adrenal gland with signal dropout on out of phase images most compatible with an adenoma. There is no hydronephrosis on either side. There is an 11 mm right renal interpolar cyst. The aorta is unremarkable. There multiple mildly enlarged lymph nodes adjacent to the head of the pancreas. An enlarged lymph node in the gastrohepatic space measures approximately 13 mm in short axis. A portacaval lymph node measures approximately 12 mm in short axis. Mildly enlarged right axillary lymph nodes. IMPRESSION: Cholelithiasis with gallbladder wall thickening and edema most compatible with an acute cholecystitis. A hepatobiliary scintigraphy may provide better evaluation of the gallbladder and its functional contractility. No stone identified in the common bile duct. Small right hepatic  hemangioma as well as a small cyst in the right lobe of the liver. 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 Small right renal cyst.  No hydronephrosis. A 1.5 cm right adrenal adenoma. Enlarged retroperitoneal lymph nodes in the portacaval region as well as enlarged lymph node in the gastrohepatic space of indeterminate etiology. These lymph nodes appear similar to chest CT dated 12/20/2014 but have increased in size since the study dated 11/27/2011. Although these may be reactive, Underlying malignancy or metastatic disease is not excluded. Clinical correlation is recommended. Nonemergent CT of the abdomen and pelvis is recommended for complete evaluation. Electronically Signed   By: AAnner CreteM.D.   On: 07/10/2015 00:32   [place]domen Limited  07/09/2015  CLINICAL DATA:  Acute onset of right upper quadrant abdominal pain. Initial encounter. EXAM: [place]DOMEN LIMITED - RIGHT UPPER QUADRANT COMPARISON:  Abdominal ultrasound performed 09/03/2010, and CT of the chest, abdomen and pelvis performed 11/27/2011 FINDINGS: Gallbladder: Stones are seen dependently within the gallbladder, measuring up to 7 mm. Gallbladder wall thickening is noted, measuring up to 6 mm. No pericholecystic fluid is seen. No ultrasonographic Murphy's sign is elicited. Common bile duct: Diameter: 1.4 cm, significantly dilated. Liver: A small 1.6 cm hyperechoic focus within the inferior right hepatic lobe likely reflects a small hemangioma. The liver is otherwise unremarkable in appearance, demonstrating normal parenchymal echogenicity. A small right renal cyst is noted. IMPRESSION: 1. Cholelithiasis, with diffuse gallbladder wall  thickening and a significantly dilated common bile duct. This raises concern for distal obstruction. MRCP or ERCP would be helpful for further evaluation. 2. 1.6 cm hyperechoic focus within the inferior right hepatic lobe likely reflects a small hemangioma. 3. Small right renal cyst  noted. Electronically Signed   By: Garald Balding M.D.   On: 07/09/2015 19:35   Mr Jeananne Rama W/wo Cm/mrcp  07/10/2015  CLINICAL DATA:  55 year old female with right upper quadrant abdominal pain and nausea. EXAM: MRI ABDOMEN WITHOUT AND WITH CONTRAST (INCLUDING MRCP) TECHNIQUE: Multiplanar multisequence MR imaging of the abdomen was performed both before and after the administration of intravenous contrast. Heavily T2-weighted images of the biliary and pancreatic ducts were obtained, and three-dimensional MRCP images were rendered by post processing. CONTRAST:  62m MULTIHANCE GADOBENATE DIMEGLUMINE 529 MG/ML IV SOLN COMPARISON:  Stop right upper quadrant ultrasound dated 07/09/2015 FINDINGS: Evaluation of this exam is limited due to respiratory motion artifact. There is a 11 mm low T1 high T2 signal intensity lesion in the right lobe of the liver posteriorly. This lesion demonstrates an irregular and nodular peripheral enhancement and most compatible with a hemangioma. A subcentimeter low T1/high T2 subcapsular lesion in the right lobe of the liver inferiorly does not demonstrate enhancement and represents a cyst. There are multiple stones within the gallbladder. There is enhancement of the gallbladder mucosa. There is diffuse edema and thickening of the gallbladder wall. No significant pericholecystic fluid identified. There is focal dilatation of the common bile duct measuring up to 14 cm. There is normal tapering of the CBD at the head of the pancreas. No obstructing stone or lesions identified. The pancreas appears unremarkable with normal enhancement pattern. A 6 mm high T2/low T1 signal intensity in the proximal body of the pancreas with no enhancement likely represents a side branch IPMN retention cyst. There is no dilatation of the main pancreatic duct. The spleen, and the left adrenal gland appear unremarkable. There is a 1.5 cm nodule in the right adrenal gland with signal dropout on out of phase images most  compatible with an adenoma. There is no hydronephrosis on either side. There is an 11 mm right renal interpolar cyst. The aorta is unremarkable. There multiple mildly enlarged lymph nodes adjacent to the head of the pancreas. An enlarged lymph node in the gastrohepatic space measures approximately 13 mm in short axis. A portacaval lymph node measures approximately 12 mm in short axis. Mildly enlarged right axillary lymph nodes. IMPRESSION: Cholelithiasis with gallbladder wall thickening and edema most compatible with an acute cholecystitis. A hepatobiliary scintigraphy may provide better evaluation of the gallbladder and its functional contractility. No stone identified in the common bile duct. Small right hepatic hemangioma as well as a small cyst in the right lobe of the liver. 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 Small right renal cyst.  No hydronephrosis. A 1.5 cm right adrenal adenoma. Enlarged retroperitoneal lymph nodes in the portacaval region as well as enlarged lymph node in the gastrohepatic space of indeterminate etiology. These lymph nodes appear similar to chest CT dated 12/20/2014 but have increased in size since the study dated 11/27/2011. Although these may be reactive, Underlying malignancy or metastatic disease is not excluded. Clinical correlation is recommended. Nonemergent CT of the abdomen and pelvis is recommended for complete evaluation. Electronically Signed   By: AAnner CreteM.D.   On: 07/10/2015 00:32    Medications / Allergies:  Scheduled Meds: . antiseptic oral rinse  7 mL  Mouth Rinse BID  . cefTRIAXone (ROCEPHIN)  IV  2 g Intravenous Q24H  . heparin  5,000 Units Subcutaneous Q8H   Continuous Infusions: . sodium chloride 100 mL/hr at 07/10/15 0046   PRN Meds:.acetaminophen **OR** acetaminophen, HYDROmorphone (DILAUDID) injection, methocarbamol, ondansetron **OR** ondansetron (ZOFRAN) IV  Antibiotics: Anti-infectives    Start      Dose/Rate Route Frequency Ordered Stop   07/10/15 0025  cefTRIAXone (ROCEPHIN) 2 g in dextrose 5 % 50 mL IVPB     2 g 100 mL/hr over 30 Minutes Intravenous Every 24 hours 07/10/15 0025          Assessment/Plan Acute cholecystitis-MRCP negative for CBD stone.  Will proceed with laparoscopic cholecystectomy with IOC today or tomorrow pending OR schedule.  Surgical risks discussed including infection, bleeding, injury to surrounding structures, heart attack, DVT/PE, respiratory compromise and death. The patient verbalizes understanding and wishes to proceed.  Right adrenal adenoma-will d/w attending for any additional inpatient eval Right hepatic hemangioma and hepatic cyst-OP follow up.  Enlarged retroperitoneal lymph nodes-patient is aware.  Discussed that it has decreased since 2013.  She understands to follow up with PCP for an outpatient CT scan and further follow up.  ID-rocephin VTE prophylaxis-scd, heparin FEN-NPO, IVF Dispo-OR  Erby Pian, [facility] Pager [phone]) For consults and floor pages call [phone])  07/10/2015 9:41 AM

## 2015-07-10 NOTE — Anesthesia Preprocedure Evaluation (Addendum)
Anesthesia Evaluation  Patient identified by MRN, date of birth, ID band Patient awake    Reviewed: Allergy & Precautions, NPO status , Patient's Chart, lab work & pertinent test results  Airway Mallampati: II  TM Distance: >3 FB Neck ROM: Full    Dental  (+) Dental Advisory Given, Poor Dentition,    Pulmonary Current Smoker,    breath sounds clear to auscultation       Cardiovascular hypertension, Pt. on medications negative cardio ROS   Rhythm:Regular     Neuro/Psych Anxiety Depression Bipolar Disorder Schizophrenia negative neurological ROS  negative psych ROS   GI/Hepatic negative GI ROS, Neg liver ROS,   Endo/Other  negative endocrine ROSHypothyroidism BMI 30  Renal/GU negative Renal ROS  negative genitourinary   Musculoskeletal negative musculoskeletal ROS (+)   Abdominal (+)  Abdomen: soft.    Peds negative pediatric ROS (+)  Hematology negative hematology ROS (+) 11/35   Anesthesia Other Findings   Reproductive/Obstetrics negative OB ROS                           Anesthesia Physical Anesthesia Plan  ASA: II  Anesthesia Plan: General   Post-op Pain Management:    Induction: Intravenous  Airway Management Planned: Oral ETT  Additional Equipment:   Intra-op Plan:   Post-operative Plan: Extubation in OR  Informed Consent: I have reviewed the patients History and Physical, chart, labs and discussed the procedure including the risks, benefits and alternatives for the proposed anesthesia with the patient or authorized representative who has indicated his/her understanding and acceptance.     Plan Discussed with:   Anesthesia Plan Comments:         Anesthesia Quick Evaluation

## 2015-07-11 ENCOUNTER — Observation Stay (HOSPITAL_COMMUNITY): Payer: BLUE CROSS/BLUE SHIELD | Admitting: Anesthesiology

## 2015-07-11 ENCOUNTER — Observation Stay (HOSPITAL_COMMUNITY): Payer: BLUE CROSS/BLUE SHIELD

## 2015-07-11 ENCOUNTER — Other Ambulatory Visit: Payer: Self-pay | Admitting: *Deleted

## 2015-07-11 ENCOUNTER — Encounter (HOSPITAL_COMMUNITY)
Admission: EM | Disposition: A | Discharge status: Home / Self Care | Payer: Self-pay | Source: Home / Self Care | Attending: Emergency Medicine

## 2015-07-11 ENCOUNTER — Telehealth: Payer: Self-pay | Admitting: *Deleted

## 2015-07-11 ENCOUNTER — Encounter (HOSPITAL_COMMUNITY): Payer: Self-pay | Admitting: Certified Registered Nurse Anesthetist

## 2015-07-11 HISTORY — PX: CHOLECYSTECTOMY: SHX55

## 2015-07-11 SURGERY — LAPAROSCOPIC CHOLECYSTECTOMY WITH INTRAOPERATIVE CHOLANGIOGRAM
Anesthesia: General | Site: Abdomen

## 2015-07-11 MED ORDER — LIDOCAINE HCL 1 % IJ SOLN
INTRAMUSCULAR | Status: DC | PRN
  Administered 2015-07-11: 17 mL

## 2015-07-11 MED ORDER — FENTANYL CITRATE (PF) 100 MCG/2ML IJ SOLN
INTRAMUSCULAR | Status: DC | PRN
Start: 2015-07-11 — End: 2015-07-11
  Administered 2015-07-11: 50 ug via INTRAVENOUS
  Administered 2015-07-11 (×2): 100 ug via INTRAVENOUS

## 2015-07-11 MED ORDER — DEXAMETHASONE SODIUM PHOSPHATE 10 MG/ML IJ SOLN
INTRAMUSCULAR | Status: AC
  Filled 2015-07-11: qty 1

## 2015-07-11 MED ORDER — FENTANYL CITRATE (PF) 100 MCG/2ML IJ SOLN
25.0000 ug | INTRAMUSCULAR | Status: DC | PRN
  Administered 2015-07-11 (×2): 50 ug via INTRAVENOUS

## 2015-07-11 MED ORDER — LIDOCAINE HCL (PF) 1 % IJ SOLN
INTRAMUSCULAR | Status: AC
  Filled 2015-07-11: qty 30

## 2015-07-11 MED ORDER — OXYCODONE HCL 5 MG PO TABS
5.0000 mg | ORAL_TABLET | ORAL | Status: DC | PRN
  Administered 2015-07-11 – 2015-07-12 (×3): 10 mg via ORAL
  Filled 2015-07-11 (×3): qty 2

## 2015-07-11 MED ORDER — BUPIVACAINE-EPINEPHRINE (PF) 0.25% -1:200000 IJ SOLN
INTRAMUSCULAR | Status: AC
  Filled 2015-07-11: qty 30

## 2015-07-11 MED ORDER — ROCURONIUM BROMIDE 100 MG/10ML IV SOLN
INTRAVENOUS | Status: DC | PRN
  Administered 2015-07-11: 40 mg via INTRAVENOUS

## 2015-07-11 MED ORDER — LIDOCAINE 2% (20 MG/ML) 5 ML SYRINGE
INTRAMUSCULAR | Status: AC
  Filled 2015-07-11: qty 5

## 2015-07-11 MED ORDER — DEXAMETHASONE SODIUM PHOSPHATE 10 MG/ML IJ SOLN
INTRAMUSCULAR | Status: DC | PRN
  Administered 2015-07-11: 10 mg via INTRAVENOUS

## 2015-07-11 MED ORDER — MIDAZOLAM HCL 5 MG/5ML IJ SOLN
INTRAMUSCULAR | Status: DC | PRN
  Administered 2015-07-11: 2 mg via INTRAVENOUS

## 2015-07-11 MED ORDER — ONDANSETRON HCL 4 MG/2ML IJ SOLN
INTRAMUSCULAR | Status: DC | PRN
  Administered 2015-07-11: 4 mg via INTRAVENOUS

## 2015-07-11 MED ORDER — PROPOFOL 10 MG/ML IV BOLUS
INTRAVENOUS | Status: DC | PRN
  Administered 2015-07-11: 160 mg via INTRAVENOUS

## 2015-07-11 MED ORDER — MIDAZOLAM HCL 2 MG/2ML IJ SOLN
INTRAMUSCULAR | Status: AC
  Filled 2015-07-11: qty 2

## 2015-07-11 MED ORDER — EPHEDRINE SULFATE 50 MG/ML IJ SOLN
INTRAMUSCULAR | Status: DC | PRN
  Administered 2015-07-11: 15 mg via INTRAVENOUS
  Administered 2015-07-11: 5 mg via INTRAVENOUS

## 2015-07-11 MED ORDER — ROCURONIUM BROMIDE 50 MG/5ML IV SOLN
INTRAVENOUS | Status: AC
  Filled 2015-07-11: qty 1

## 2015-07-11 MED ORDER — 0.9 % SODIUM CHLORIDE (POUR BTL) OPTIME
TOPICAL | Status: DC | PRN
  Administered 2015-07-11: 1000 mL

## 2015-07-11 MED ORDER — LACTATED RINGERS IV SOLN
INTRAVENOUS | Status: DC
  Administered 2015-07-11 (×2): via INTRAVENOUS

## 2015-07-11 MED ORDER — LIDOCAINE HCL (CARDIAC) 20 MG/ML IV SOLN
INTRAVENOUS | Status: DC | PRN
  Administered 2015-07-11: 100 mg via INTRAVENOUS

## 2015-07-11 MED ORDER — SODIUM CHLORIDE 0.9 % IV SOLN
INTRAVENOUS | Status: DC | PRN
  Administered 2015-07-11: 14 mL

## 2015-07-11 MED ORDER — SUGAMMADEX SODIUM 200 MG/2ML IV SOLN
INTRAVENOUS | Status: DC | PRN
  Administered 2015-07-11: 160 mg via INTRAVENOUS

## 2015-07-11 MED ORDER — PROPOFOL 10 MG/ML IV BOLUS
INTRAVENOUS | Status: AC
  Filled 2015-07-11: qty 20

## 2015-07-11 MED ORDER — PROMETHAZINE HCL 25 MG/ML IJ SOLN: 6.2500 mg | INTRAMUSCULAR | Status: DC | PRN

## 2015-07-11 MED ORDER — OXYCODONE HCL 5 MG PO TABS
ORAL_TABLET | ORAL | Status: AC
  Filled 2015-07-11: qty 2

## 2015-07-11 MED ORDER — SODIUM CHLORIDE 0.9 % IR SOLN
Status: DC | PRN
  Administered 2015-07-11: 1000 mL

## 2015-07-11 MED ORDER — FENTANYL CITRATE (PF) 100 MCG/2ML IJ SOLN
INTRAMUSCULAR | Status: AC
  Filled 2015-07-11: qty 2

## 2015-07-11 MED ORDER — ONDANSETRON HCL 4 MG/2ML IJ SOLN
INTRAMUSCULAR | Status: AC
  Filled 2015-07-11: qty 2

## 2015-07-11 MED ORDER — PHENYLEPHRINE HCL 10 MG/ML IJ SOLN
INTRAMUSCULAR | Status: DC | PRN
  Administered 2015-07-11 (×2): 120 ug via INTRAVENOUS

## 2015-07-11 MED ORDER — FENTANYL CITRATE (PF) 250 MCG/5ML IJ SOLN
INTRAMUSCULAR | Status: AC
  Filled 2015-07-11: qty 5

## 2015-07-11 MED ORDER — MEPERIDINE HCL 25 MG/ML IJ SOLN: 6.2500 mg | INTRAMUSCULAR | Status: DC | PRN

## 2015-07-11 MED ORDER — EPHEDRINE SULFATE 50 MG/ML IJ SOLN: INTRAMUSCULAR | Status: DC | PRN

## 2015-07-11 MED ORDER — IOPAMIDOL (ISOVUE-300) INJECTION 61%
INTRAVENOUS | Status: AC
  Filled 2015-07-11: qty 50

## 2015-07-11 SURGICAL SUPPLY — 47 items
APPLIER CLIP ROT 10 11.4 M/L (STAPLE) ×2
BLADE SURG ROTATE 9660 (MISCELLANEOUS) IMPLANT
CANISTER SUCTION 2500CC (MISCELLANEOUS) ×2 IMPLANT
CHLORAPREP W/TINT 26ML (MISCELLANEOUS) ×2 IMPLANT
CLIP APPLIE ROT 10 11.4 M/L (STAPLE) ×1 IMPLANT
COVER MAYO STAND STRL (DRAPES) ×2 IMPLANT
COVER SURGICAL LIGHT HANDLE (MISCELLANEOUS) ×2 IMPLANT
DERMABOND ADVANCED (GAUZE/BANDAGES/DRESSINGS) ×1
DERMABOND ADVANCED .7 DNX12 (GAUZE/BANDAGES/DRESSINGS) ×1 IMPLANT
DRAPE C-ARM 42X72 X-RAY (DRAPES) ×2 IMPLANT
DRAPE WARM FLUID 44X44 (DRAPE) ×2 IMPLANT
ELECT REM PT RETURN 9FT ADLT (ELECTROSURGICAL) ×2
ELECTRODE REM PT RTRN 9FT ADLT (ELECTROSURGICAL) ×1 IMPLANT
FILTER SMOKE EVAC LAPAROSHD (FILTER) IMPLANT
GLOVE BIO SURGEON STRL SZ 6 (GLOVE) ×2 IMPLANT
GLOVE BIO SURGEON STRL SZ7 (GLOVE) ×4 IMPLANT
GLOVE BIOGEL PI IND STRL 6.5 (GLOVE) ×1 IMPLANT
GLOVE BIOGEL PI IND STRL 7.0 (GLOVE) ×1 IMPLANT
GLOVE BIOGEL PI IND STRL 7.5 (GLOVE) ×1 IMPLANT
GLOVE BIOGEL PI INDICATOR 6.5 (GLOVE) ×1
GLOVE BIOGEL PI INDICATOR 7.0 (GLOVE) ×1
GLOVE BIOGEL PI INDICATOR 7.5 (GLOVE) ×1
GLOVE ECLIPSE 7.5 STRL STRAW (GLOVE) ×2 IMPLANT
GOWN STRL REUS W/ TWL LRG LVL3 (GOWN DISPOSABLE) ×2 IMPLANT
GOWN STRL REUS W/TWL 2XL LVL3 (GOWN DISPOSABLE) ×2 IMPLANT
GOWN STRL REUS W/TWL LRG LVL3 (GOWN DISPOSABLE) ×2
KIT BASIN OR (CUSTOM PROCEDURE TRAY) ×2 IMPLANT
KIT ROOM TURNOVER OR (KITS) ×2 IMPLANT
L-HOOK LAP DISP 36CM (ELECTROSURGICAL) ×2
LHOOK LAP DISP 36CM (ELECTROSURGICAL) ×1 IMPLANT
NS IRRIG 1000ML POUR BTL (IV SOLUTION) ×2 IMPLANT
PAD ARMBOARD 7.5X6 YLW CONV (MISCELLANEOUS) ×2 IMPLANT
PENCIL BUTTON HOLSTER BLD 10FT (ELECTRODE) ×2 IMPLANT
POUCH SPECIMEN RETRIEVAL 10MM (ENDOMECHANICALS) ×2 IMPLANT
SCISSORS LAP 5X35 DISP (ENDOMECHANICALS) ×2 IMPLANT
SET CHOLANGIOGRAPH 5 50 .035 (SET/KITS/TRAYS/PACK) ×2 IMPLANT
SET IRRIG TUBING LAPAROSCOPIC (IRRIGATION / IRRIGATOR) ×2 IMPLANT
SLEEVE ENDOPATH XCEL 5M (ENDOMECHANICALS) ×4 IMPLANT
SPECIMEN JAR SMALL (MISCELLANEOUS) ×2 IMPLANT
SUT MNCRL AB 4-0 PS2 18 (SUTURE) ×2 IMPLANT
TOWEL OR 17X24 6PK STRL BLUE (TOWEL DISPOSABLE) ×2 IMPLANT
TOWEL OR 17X26 10 PK STRL BLUE (TOWEL DISPOSABLE) ×2 IMPLANT
TRAY LAPAROSCOPIC MC (CUSTOM PROCEDURE TRAY) ×2 IMPLANT
TROCAR XCEL BLUNT TIP 100MML (ENDOMECHANICALS) ×2 IMPLANT
TROCAR XCEL NON-BLD 11X100MML (ENDOMECHANICALS) ×2 IMPLANT
TROCAR XCEL NON-BLD 5MMX100MML (ENDOMECHANICALS) ×2 IMPLANT
TUBING INSUFFLATION (TUBING) ×2 IMPLANT

## 2015-07-11 NOTE — Transfer of Care (Signed)
Immediate Anesthesia Transfer of Care Note  Patient: Toni Moses  Procedure(s) Performed: Procedure(s): LAPAROSCOPIC CHOLECYSTECTOMY WITH INTRAOPERATIVE CHOLANGIOGRAM (N/A)  Patient Location: PACU  Anesthesia Type:General  Level of Consciousness: awake, alert , oriented and patient cooperative  Airway & Oxygen Therapy: Patient Spontanous Breathing and Patient connected to nasal cannula oxygen  Post-op Assessment: Report given to RN and Post -op Vital signs reviewed and stable  Post vital signs: Reviewed and stable  Last Vitals:  Filed Vitals:   07/10/15 2144 07/11/15 0631  BP: 137/74 154/98  Pulse: 70 68  Temp: 37.3 C 36.6 C  Resp: 17 18    Last Pain:  Filed Vitals:   07/11/15 1022  PainSc: Asleep         Complications: No apparent anesthesia complications

## 2015-07-11 NOTE — Progress Notes (Signed)
Pt a/o, PRN Dilaudid given as ordered, pt on NS @ 100 ml/hr, VSS, pt stable

## 2015-07-11 NOTE — Progress Notes (Signed)
Patient concerned that she has not had any of her home medications- MD on call has been paged for this concern. Awaiting call back.

## 2015-07-11 NOTE — Anesthesia Postprocedure Evaluation (Signed)
Anesthesia Post Note  Patient: DELMA COCCO  Procedure(s) Performed: Procedure(s) (LRB): LAPAROSCOPIC CHOLECYSTECTOMY WITH INTRAOPERATIVE CHOLANGIOGRAM (N/A)  Patient location during evaluation: PACU Anesthesia Type: General Level of consciousness: awake and alert Pain management: pain level controlled Vital Signs Assessment: post-procedure vital signs reviewed and stable Respiratory status: spontaneous breathing, nonlabored ventilation, respiratory function stable and patient connected to nasal cannula oxygen Cardiovascular status: blood pressure returned to baseline and stable Postop Assessment: no signs of nausea or vomiting Anesthetic complications: no    Last Vitals:  Filed Vitals:   07/11/15 0631 07/11/15 1021  BP: 154/98 167/79  Pulse: 68 67  Temp: 36.6 C 36.4 C  Resp: 18 11    Last Pain:  Filed Vitals:   07/11/15 1028  PainSc: Asleep                 Alexis Frock

## 2015-07-11 NOTE — Op Note (Signed)
Laparoscopic Cholecystectomy with IOC Procedure Note  Indications: This patient presents with Acute calculous cholecystitis and will undergo laparoscopic cholecystectomy.  Pre-operative Diagnosis: see above  Post-operative Diagnosis: Same  Surgeon: Stark Klein   Assistants: Judyann Munson, RNFA  Anesthesia: General endotracheal anesthesia and local  ASA Class: 2  Procedure Details  The patient was seen again in the Holding Room. The risks, benefits, complications, treatment options, and expected outcomes were discussed with the patient. The possibilities of  bleeding, recurrent infection, damage to nearby structures, the need for additional procedures, failure to diagnose a condition, the possible need to convert to an open procedure, and creating a complication requiring transfusion or operation were discussed with the patient. The likelihood of improving the patient's symptoms with return to their baseline status is good.    The patient and/or family concurred with the proposed plan, giving informed consent. The site of surgery properly noted. The patient was taken to Operating Room, and the procedure verified as Laparoscopic Cholecystectomy with Intraoperative Cholangiogram. A Time Out was held and the above information confirmed.  Prior to the induction of general anesthesia, antibiotic prophylaxis was administered. General endotracheal anesthesia was then administered and tolerated well. After the induction, the abdomen was prepped with Chloraprep and draped in the sterile fashion. The patient was positioned in the supine position.  Local anesthetic agent was injected into the skin near the umbilicus and an incision made. We dissected down to the abdominal fascia with blunt dissection.  The fascia was incised vertically and we entered the peritoneal cavity bluntly.  A pursestring suture of 0-Vicryl was placed around the fascial opening.  The Hasson cannula was inserted and secured with  the stay suture.  Pneumoperitoneum was then created with CO2 and tolerated well without any adverse changes in the patient's vital signs.  There were significant omental adhesions to the abdominal wall.  A small window was made to the right lateral abdomen and two 5 mm trocars were placed in this location.  The camera was switched to the right side, and an additional 5 mm trocar was placed in the right lower quadrant.  The adhesions were taken down sharply and bluntly where appropriate.  No cautery was used during this portion.  The adhesions were all omentum to the abdominal wall rather than bowel.    Once the surgical field was visualized, an 11-mm port was placed in the subxiphoid position. We positioned the patient in reverse Trendelenburg, tilted slightly to the patient's left.  The gallbladder was identified, the fundus grasped and retracted cephalad. Adhesions were lysed bluntly and with the electrocautery where indicated, taking care not to injure any adjacent organs or viscus. The infundibulum was grasped and retracted laterally, exposing the peritoneum overlying the triangle of Calot. This was then divided and exposed in a blunt fashion. A critical view of the cystic duct and cystic artery was obtained.  The cystic duct was clearly identified and bluntly dissected circumferentially. The cystic duct was ligated with a clip distally.   An incision was made in the cystic duct and the [facility] cholangiogram catheter introduced. The catheter was secured using a clip. A cholangiogram was then performed, demonstrating good filling of the left and right hepatic duct, a long cystic duct, a common bile duct without filling defects and filling into the duodenum.  The cystic duct was then ligated with clips and divided. The cystic artery was identified, dissected free, ligated with clips and divided as well.   The gallbladder was dissected from the  liver bed in retrograde fashion with the electrocautery.  There  was one area that was suspicious for being a duct of luschka.  This was clipped.   The gallbladder was removed and placed in an Endocatch bag.  The gallbladder and Endocatch bag were then removed through the umbilical port site.  The liver bed was irrigated and inspected. Hemostasis was achieved with the electrocautery. Copious irrigation was utilized and was repeatedly aspirated until clear.    We again inspected the right upper quadrant for hemostasis.  Pneumoperitoneum was released as we removed the trocars.   The pursestring suture was used to close the umbilical fascia.  4-0 Monocryl was used to close the skin.   The skin was cleaned and dry, and Dermabond was applied. The patient was then extubated and brought to the recovery room in stable condition. Instrument, sponge, and needle counts were correct at closure and at the conclusion of the case.   Findings: Acute inflammation and stones.    Estimated Blood Loss: min         Drains: none          Specimens: Gallbladder to pathology       Complications: None; patient tolerated the procedure well.         Disposition: PACU - hemodynamically stable.         Condition: stable

## 2015-07-11 NOTE — Telephone Encounter (Signed)
Received fax from [employer], [place] [phone] Fax#: [phone] FMLA Forms, Printed out last OV with Face Sheet and placed for Dr. Mariea Clonts to review and sign.

## 2015-07-11 NOTE — OR Nursing (Signed)
Correction: specimen time out between [facility] RN and [place]

## 2015-07-11 NOTE — Interval H&P Note (Signed)
History and Physical Interval Note:  07/11/2015 8:55 AM  Toni Moses  has presented today for surgery, with the diagnosis of ACUTE CHOLECYSTITIS  The various methods of treatment have been discussed with the patient and family. After consideration of risks, benefits and other options for treatment, the patient has consented to  Procedure(s): LAPAROSCOPIC CHOLECYSTECTOMY WITH INTRAOPERATIVE CHOLANGIOGRAM (N/A) as a surgical intervention .  The patient's history has been reviewed, patient examined, no change in status, stable for surgery.  I have reviewed the patient's chart and labs.  Questions were answered to the patient's satisfaction.     [name]

## 2015-07-11 NOTE — Progress Notes (Signed)
Pt. Off unit. ?

## 2015-07-11 NOTE — Anesthesia Procedure Notes (Signed)
Procedure Name: Intubation Date/Time: 07/11/2015 9:14 AM Performed by: Salli Quarry Ani Deoliveira Pre-anesthesia Checklist: Patient identified, Emergency Drugs available, Suction available and Patient being monitored Patient Re-evaluated:Patient Re-evaluated prior to inductionOxygen Delivery Method: Circle System Utilized Preoxygenation: Pre-oxygenation with 100% oxygen Intubation Type: IV induction Ventilation: Mask ventilation without difficulty Laryngoscope Size: Mac and 3 Grade View: Grade II Tube type: Oral Tube size: 7.0 mm Number of attempts: 1 Airway Equipment and Method: Stylet Placement Confirmation: ETT inserted through vocal cords under direct vision,  positive ETCO2 and breath sounds checked- equal and bilateral Secured at: 22 cm Tube secured with: Tape Dental Injury: Teeth and Oropharynx as per pre-operative assessment

## 2015-07-11 NOTE — H&P (View-Only) (Signed)
Patient ID: Toni Moses, female   DOB: Mar 16, 1960, 55 y.o.   MRN: [id number]     [facility]      [address] [place], [place] [address]    Phone: [phone] FAX: [phone]     Subjective: Still with pain. No n/v. lfts normal. mrcp without evidence of cbd stone.  Vss.  Afebrile.   Objective:  Vital signs:  Filed Vitals:   07/09/15 2215 07/10/15 0029 07/10/15 0524 07/10/15 0900  BP: 107/74 147/84 133/58 142/121  Pulse: 63 56 56 59  Temp:  98 F (36.7 C) 97.7 F (36.5 C) 98.4 F (36.9 C)  TempSrc:  Oral Oral Oral  Resp: '17 16 16   ' Height:  '5\' 5"'  (1.651 m)    Weight:  82.328 kg (181 lb 8 oz)    SpO2: 99% 98% 97% 100%    Last BM Date: 07/09/15  Intake/Output   Yesterday:  06/19 0701 - 06/20 0700 In: 1 [P.O.:1] Out: -  This shift: I/O last 3 completed shifts: In: 1 [P.O.:1] Out: -     Physical Exam: General: Pt awake/alert/oriented x4 in no acute distress Chest: cta.  No chest wall pain w good excursion CV:  Pulses intact.  Regular rhythm MS: Normal AROM mjr joints.  No obvious deformity Abdomen: Soft.  Nondistended. TTP RUQ.   No evidence of peritonitis.  No incarcerated hernias. Ext:  SCDs BLE.  No mjr edema.  No cyanosis Skin: No petechiae / purpura   Problem List:   Active Problems:   Cholecystitis    Results:   Labs: Results for orders placed or performed during the hospital encounter of 07/09/15 (from the past 48 hour(s))  Basic metabolic panel     Status: Abnormal   Collection Time: 07/09/15  1:20 PM  Result Value Ref Range   Sodium 138 135 - 145 mmol/L   Potassium 4.2 3.5 - 5.1 mmol/L   Chloride 107 101 - 111 mmol/L   CO2 26 22 - 32 mmol/L   Glucose, Bld 122 (H) 65 - 99 mg/dL   BUN 9 6 - 20 mg/dL   Creatinine, Ser 1.19 (H) 0.44 - 1.00 mg/dL   Calcium 9.6 8.9 - 10.3 mg/dL   GFR calc non Af Amer 50 (L) >60 mL/min   GFR calc Af Amer 58 (L) >60 mL/min    Comment: (NOTE) The eGFR has  been calculated using the CKD EPI equation. This calculation has not been validated in all clinical situations. eGFR's persistently <60 mL/min signify possible Chronic Kidney Disease.    Anion gap 5 5 - 15  CBC     Status: Abnormal   Collection Time: 07/09/15  1:20 PM  Result Value Ref Range   WBC 6.1 4.0 - 10.5 K/uL   RBC 4.35 3.87 - 5.11 MIL/uL   Hemoglobin 11.7 (L) 12.0 - 15.0 g/dL   HCT 36.5 36.0 - 46.0 %   MCV 83.9 78.0 - 100.0 fL   MCH 26.9 26.0 - 34.0 pg   MCHC 32.1 30.0 - 36.0 g/dL   RDW 17.6 (H) 11.5 - 15.5 %   Platelets 305 150 - 400 K/uL  I-stat troponin, ED     Status: None   Collection Time: 07/09/15  1:30 PM  Result Value Ref Range   Troponin i, poc 0.00 0.00 - 0.08 ng/mL   Comment 3            Comment: Due to the release  kinetics of cTnI, a negative result within the first hours of the onset of symptoms does not rule out myocardial infarction with certainty. If myocardial infarction is still suspected, repeat the test at appropriate intervals.   Hepatic function panel     Status: None   Collection Time: 07/09/15  6:20 PM  Result Value Ref Range   Total Protein 7.5 6.5 - 8.1 g/dL   Albumin 3.5 3.5 - 5.0 g/dL   AST 28 15 - 41 U/L   ALT 18 14 - 54 U/L   Alkaline Phosphatase 90 38 - 126 U/L   Total Bilirubin 0.6 0.3 - 1.2 mg/dL   Bilirubin, Direct 0.2 0.1 - 0.5 mg/dL   Indirect Bilirubin 0.4 0.3 - 0.9 mg/dL  Lipase, blood     Status: None   Collection Time: 07/09/15  6:20 PM  Result Value Ref Range   Lipase 44 11 - 51 U/L  Comprehensive metabolic panel     Status: Abnormal   Collection Time: 07/10/15  5:10 AM  Result Value Ref Range   Sodium 138 135 - 145 mmol/L   Potassium 3.7 3.5 - 5.1 mmol/L   Chloride 107 101 - 111 mmol/L   CO2 25 22 - 32 mmol/L   Glucose, Bld 86 65 - 99 mg/dL   BUN 8 6 - 20 mg/dL   Creatinine, Ser 1.10 (H) 0.44 - 1.00 mg/dL   Calcium 8.8 (L) 8.9 - 10.3 mg/dL   Total Protein 6.9 6.5 - 8.1 g/dL   Albumin 3.2 (L) 3.5 - 5.0 g/dL    AST 24 15 - 41 U/L   ALT 16 14 - 54 U/L   Alkaline Phosphatase 87 38 - 126 U/L   Total Bilirubin 0.5 0.3 - 1.2 mg/dL   GFR calc non Af Amer 55 (L) >60 mL/min   GFR calc Af Amer >60 >60 mL/min    Comment: (NOTE) The eGFR has been calculated using the CKD EPI equation. This calculation has not been validated in all clinical situations. eGFR's persistently <60 mL/min signify possible Chronic Kidney Disease.    Anion gap 6 5 - 15  CBC     Status: Abnormal   Collection Time: 07/10/15  5:10 AM  Result Value Ref Range   WBC 5.7 4.0 - 10.5 K/uL   RBC 4.25 3.87 - 5.11 MIL/uL   Hemoglobin 11.3 (L) 12.0 - 15.0 g/dL   HCT 35.6 (L) 36.0 - 46.0 %   MCV 83.8 78.0 - 100.0 fL   MCH 26.6 26.0 - 34.0 pg   MCHC 31.7 30.0 - 36.0 g/dL   RDW 17.9 (H) 11.5 - 15.5 %   Platelets 292 150 - 400 K/uL    Imaging / Studies: Dg Chest 2 View  07/09/2015  CLINICAL DATA:  Chest pain since last night, worsening today EXAM: CHEST  2 VIEW COMPARISON:  Chest x-ray and chest CT 12/20/2014 FINDINGS: Linear scarring at the left base. 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 No confluent airspace opacity or effusion. Heart is normal size. No acute bony abnormality. IMPRESSION: No active cardiopulmonary disease. Electronically Signed   By: Rolm Baptise M.D.   On: 07/09/2015 14:03   Mr 3d Recon At Scanner  07/10/2015  CLINICAL DATA:  55 year old female with right upper quadrant abdominal pain and nausea. EXAM: MRI ABDOMEN WITHOUT AND WITH CONTRAST (INCLUDING MRCP) TECHNIQUE: Multiplanar multisequence MR imaging of the abdomen was performed both before and after the administration of intravenous contrast. Heavily T2-weighted images of the biliary  and pancreatic ducts were obtained, and three-dimensional MRCP images were rendered by post processing. CONTRAST:  32m MULTIHANCE GADOBENATE DIMEGLUMINE 529 MG/ML IV SOLN COMPARISON:  Stop right upper quadrant ultrasound dated 07/09/2015 FINDINGS: Evaluation of this exam is limited  due to respiratory motion artifact. There is a 11 mm low T1 high T2 signal intensity lesion in the right lobe of the liver posteriorly. This lesion demonstrates an irregular and nodular peripheral enhancement and most compatible with a hemangioma. A subcentimeter low T1/high T2 subcapsular lesion in the right lobe of the liver inferiorly does not demonstrate enhancement and represents a cyst. There are multiple stones within the gallbladder. There is enhancement of the gallbladder mucosa. There is diffuse edema and thickening of the gallbladder wall. No significant pericholecystic fluid identified. There is focal dilatation of the common bile duct measuring up to 14 cm. There is normal tapering of the CBD at the head of the pancreas. No obstructing stone or lesions identified. The pancreas appears unremarkable with normal enhancement pattern. A 6 mm high T2/low T1 signal intensity in the proximal body of the pancreas with no enhancement likely represents a side branch IPMN retention cyst. There is no dilatation of the main pancreatic duct. The spleen, and the left adrenal gland appear unremarkable. There is a 1.5 cm nodule in the right adrenal gland with signal dropout on out of phase images most compatible with an adenoma. There is no hydronephrosis on either side. There is an 11 mm right renal interpolar cyst. The aorta is unremarkable. There multiple mildly enlarged lymph nodes adjacent to the head of the pancreas. An enlarged lymph node in the gastrohepatic space measures approximately 13 mm in short axis. A portacaval lymph node measures approximately 12 mm in short axis. Mildly enlarged right axillary lymph nodes. IMPRESSION: Cholelithiasis with gallbladder wall thickening and edema most compatible with an acute cholecystitis. A hepatobiliary scintigraphy may provide better evaluation of the gallbladder and its functional contractility. No stone identified in the common bile duct. Small right hepatic  hemangioma as well as a small cyst in the right lobe of the liver. 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 Small right renal cyst.  No hydronephrosis. A 1.5 cm right adrenal adenoma. Enlarged retroperitoneal lymph nodes in the portacaval region as well as enlarged lymph node in the gastrohepatic space of indeterminate etiology. These lymph nodes appear similar to chest CT dated 12/20/2014 but have increased in size since the study dated 11/27/2011. Although these may be reactive, Underlying malignancy or metastatic disease is not excluded. Clinical correlation is recommended. Nonemergent CT of the abdomen and pelvis is recommended for complete evaluation. Electronically Signed   By: AAnner CreteM.D.   On: 07/10/2015 00:32   [place]Abdomen Limited  07/09/2015  CLINICAL DATA:  Acute onset of right upper quadrant abdominal pain. Initial encounter. EXAM: [place]ABDOMEN LIMITED - RIGHT UPPER QUADRANT COMPARISON:  Abdominal ultrasound performed 09/03/2010, and CT of the chest, abdomen and pelvis performed 11/27/2011 FINDINGS: Gallbladder: Stones are seen dependently within the gallbladder, measuring up to 7 mm. Gallbladder wall thickening is noted, measuring up to 6 mm. No pericholecystic fluid is seen. No ultrasonographic Murphy's sign is elicited. Common bile duct: Diameter: 1.4 cm, significantly dilated. Liver: A small 1.6 cm hyperechoic focus within the inferior right hepatic lobe likely reflects a small hemangioma. The liver is otherwise unremarkable in appearance, demonstrating normal parenchymal echogenicity. A small right renal cyst is noted. IMPRESSION: 1. Cholelithiasis, with diffuse gallbladder wall  thickening and a significantly dilated common bile duct. This raises concern for distal obstruction. MRCP or ERCP would be helpful for further evaluation. 2. 1.6 cm hyperechoic focus within the inferior right hepatic lobe likely reflects a small hemangioma. 3. Small right renal cyst  noted. Electronically Signed   By: Garald Balding M.D.   On: 07/09/2015 19:35   Mr Jeananne Rama W/wo Cm/mrcp  07/10/2015  CLINICAL DATA:  55 year old female with right upper quadrant abdominal pain and nausea. EXAM: MRI ABDOMEN WITHOUT AND WITH CONTRAST (INCLUDING MRCP) TECHNIQUE: Multiplanar multisequence MR imaging of the abdomen was performed both before and after the administration of intravenous contrast. Heavily T2-weighted images of the biliary and pancreatic ducts were obtained, and three-dimensional MRCP images were rendered by post processing. CONTRAST:  59m MULTIHANCE GADOBENATE DIMEGLUMINE 529 MG/ML IV SOLN COMPARISON:  Stop right upper quadrant ultrasound dated 07/09/2015 FINDINGS: Evaluation of this exam is limited due to respiratory motion artifact. There is a 11 mm low T1 high T2 signal intensity lesion in the right lobe of the liver posteriorly. This lesion demonstrates an irregular and nodular peripheral enhancement and most compatible with a hemangioma. A subcentimeter low T1/high T2 subcapsular lesion in the right lobe of the liver inferiorly does not demonstrate enhancement and represents a cyst. There are multiple stones within the gallbladder. There is enhancement of the gallbladder mucosa. There is diffuse edema and thickening of the gallbladder wall. No significant pericholecystic fluid identified. There is focal dilatation of the common bile duct measuring up to 14 cm. There is normal tapering of the CBD at the head of the pancreas. No obstructing stone or lesions identified. The pancreas appears unremarkable with normal enhancement pattern. A 6 mm high T2/low T1 signal intensity in the proximal body of the pancreas with no enhancement likely represents a side branch IPMN retention cyst. There is no dilatation of the main pancreatic duct. The spleen, and the left adrenal gland appear unremarkable. There is a 1.5 cm nodule in the right adrenal gland with signal dropout on out of phase images most  compatible with an adenoma. There is no hydronephrosis on either side. There is an 11 mm right renal interpolar cyst. The aorta is unremarkable. There multiple mildly enlarged lymph nodes adjacent to the head of the pancreas. An enlarged lymph node in the gastrohepatic space measures approximately 13 mm in short axis. A portacaval lymph node measures approximately 12 mm in short axis. Mildly enlarged right axillary lymph nodes. IMPRESSION: Cholelithiasis with gallbladder wall thickening and edema most compatible with an acute cholecystitis. A hepatobiliary scintigraphy may provide better evaluation of the gallbladder and its functional contractility. No stone identified in the common bile duct. Small right hepatic hemangioma as well as a small cyst in the right lobe of the liver. 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 Small right renal cyst.  No hydronephrosis. A 1.5 cm right adrenal adenoma. Enlarged retroperitoneal lymph nodes in the portacaval region as well as enlarged lymph node in the gastrohepatic space of indeterminate etiology. These lymph nodes appear similar to chest CT dated 12/20/2014 but have increased in size since the study dated 11/27/2011. Although these may be reactive, Underlying malignancy or metastatic disease is not excluded. Clinical correlation is recommended. Nonemergent CT of the abdomen and pelvis is recommended for complete evaluation. Electronically Signed   By: AAnner CreteM.D.   On: 07/10/2015 00:32    Medications / Allergies:  Scheduled Meds: . antiseptic oral rinse  7 mL  Mouth Rinse BID  . cefTRIAXone (ROCEPHIN)  IV  2 g Intravenous Q24H  . heparin  5,000 Units Subcutaneous Q8H   Continuous Infusions: . sodium chloride 100 mL/hr at 07/10/15 0046   PRN Meds:.acetaminophen **OR** acetaminophen, HYDROmorphone (DILAUDID) injection, methocarbamol, ondansetron **OR** ondansetron (ZOFRAN) IV  Antibiotics: Anti-infectives    Start      Dose/Rate Route Frequency Ordered Stop   07/10/15 0025  cefTRIAXone (ROCEPHIN) 2 g in dextrose 5 % 50 mL IVPB     2 g 100 mL/hr over 30 Minutes Intravenous Every 24 hours 07/10/15 0025          Assessment/Plan Acute cholecystitis-MRCP negative for CBD stone.  Will proceed with laparoscopic cholecystectomy with IOC today or tomorrow pending OR schedule.  Surgical risks discussed including infection, bleeding, injury to surrounding structures, heart attack, DVT/PE, respiratory compromise and death. The patient verbalizes understanding and wishes to proceed.  Right adrenal adenoma-will d/w attending for any additional inpatient eval Right hepatic hemangioma and hepatic cyst-OP follow up.  Enlarged retroperitoneal lymph nodes-patient is aware.  Discussed that it has decreased since 2013.  She understands to follow up with PCP for an outpatient CT scan and further follow up.  ID-rocephin VTE prophylaxis-scd, heparin FEN-NPO, IVF Dispo-OR  Erby Pian, [facility] Pager [phone]) For consults and floor pages call [phone](7A-4:30P)  07/10/2015 9:41 AM

## 2015-07-12 ENCOUNTER — Encounter: Payer: Self-pay | Admitting: Orthopedic Surgery

## 2015-07-12 ENCOUNTER — Other Ambulatory Visit: Payer: Self-pay | Admitting: *Deleted

## 2015-07-12 ENCOUNTER — Encounter (HOSPITAL_COMMUNITY): Payer: Self-pay | Admitting: General Surgery

## 2015-07-12 MED ORDER — ALPRAZOLAM 1 MG PO TABS: ORAL_TABLET | ORAL | Status: DC

## 2015-07-12 MED ORDER — LEVOTHYROXINE SODIUM 75 MCG PO TABS: 150.0000 ug | ORAL_TABLET | Freq: Every day | ORAL | Status: DC

## 2015-07-12 MED ORDER — OXYCODONE-ACETAMINOPHEN 5-325 MG PO TABS: 1.0000 | ORAL_TABLET | ORAL | Status: DC | PRN

## 2015-07-12 NOTE — Discharge Summary (Signed)
Physician Discharge Summary  Patient ID: Toni Moses MRN: [id number] DOB/AGE: [birth year] 55 y.o.  Admit date: 07/09/2015 Discharge date: 07/12/2015  Discharge Diagnoses Patient Active Problem List   Diagnosis Date Noted  . Cholecystitis 07/09/2015  . Essential hypertension 05/03/2015  . 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 Caregiver stress 11/30/2014  . Neuropathic pain 07/13/2013  . Chronic pain 07/13/2013  . Tobacco use 07/13/2013  . Overweight 07/13/2013  . Tingling in extremities 04/19/2013  . Right shoulder pain 04/12/2013  . Localized swelling, mass and lump, neck 02/01/2013  . Hypercholesteremia   . Depression   . Anxiety   . Hypothyroidism   . Controlled type 2 diabetes mellitus with diabetic polyneuropathy [facility])     Consultants None   Procedures 6/21 -- Laparoscopic cholecystectomy with intraoperative cholangiogram by Dr. Stark Klein   HPI: 55 yof who states this is first episode of ruq pain that started yesterday. It appears reviewing chart that she was evaluated in 2012 with wes sign on her gb US though. She states she has ruq pain radiation straight to back since yesterday. Not getting better. Not eating. Nausea but no fevers. She has undergone Korea that shows stones and dilated cbd (certainly larger than last Korea in 2012 and abnl). Her lfts are normal.   Hospital Course: The patient underwent MRCP which showed no common bile duct stones. She was taken to the OR for the listed procedure and her IOC also was negative. She had an incidental finding of an adrenal adenoma for which she will follow up with her primary care provider. The day after surgery her pain was controlled and she was tolerating a diet. She was discharged home in good condition.     Medication List    STOP taking these medications        acetaminophen 500 MG tablet  Commonly known as:  TYLENOL      TAKE these medications        ALPRAZolam 1 MG  tablet  Commonly known as:  XANAX  take 1 tablet by mouth three times a day if needed     aspirin 81 MG chewable tablet  Chew 81 mg by mouth every 6 (six) hours as needed for moderate pain.     clobetasol cream 0.05 %  Commonly known as:  TEMOVATE  Apply 1 application topically 2 (two) times daily.     hydrocortisone 2.5 % cream  Apply topically 2 (two) times daily.     levothyroxine 150 MCG tablet  Commonly known as:  SYNTHROID, LEVOTHROID  take 1 tablet by mouth every morning     lisinopril 5 MG tablet  Commonly known as:  PRINIVIL,ZESTRIL  Take 1 tablet (5 mg total) by mouth daily.     methocarbamol 500 MG tablet  Commonly known as:  ROBAXIN  Take 1 tablet (500 mg total) by mouth 2 (two) times daily.     naproxen 500 MG tablet  Commonly known as:  NAPROSYN  Take 1 tablet (500 mg total) by mouth 2 (two) times daily with a meal. As needed     oxyCODONE-acetaminophen 5-325 MG tablet  Commonly known as:  ROXICET  Take 1-2 tablets by mouth every 4 (four) hours as needed (Pain).     QUEtiapine 50 MG tablet  Commonly known as:  SEROQUEL  Take 1 tablet (50 mg total) by mouth at bedtime.     triamcinolone cream 0.5 %  Commonly known as:  KENALOG  Apply  1 application topically 2 (two) times daily as needed (for ezcema).            Follow-up Information    Follow up with [facility] On 08/01/2015.   Specialty:  General Surgery   Why:  8:30AM   Contact information:   [address] [place] [phone]      Schedule an appointment as soon as possible for a visit with REED, TIFFANY, DO.   Specialty:  Geriatric Medicine   Contact information:   [address] [place] [address] [phone]        Signed: Lisette Abu, PA-C Pager: [phone] 07/12/2015, 9:37 AM

## 2015-07-12 NOTE — Progress Notes (Signed)
Pt requests her home meds be restarted. Will address with MD.

## 2015-07-12 NOTE — Addendum Note (Signed)
Addended by: Rafael Bihari A on: 07/12/2015 08:18 AM   Modules accepted: Orders

## 2015-07-12 NOTE — Progress Notes (Signed)
Pt discharge education and instructions completed with pt at bedside; pt voices understanding and denies any questions. Pt IV removed; pt handed her prescription for Roxicet; pt states her MD has her xanax prescription. Pt discharge home with family to transport her home. Pt offered wheelchair but she declined and ambulated off unit with belongings to the side. Delia Heady RN

## 2015-07-12 NOTE — Telephone Encounter (Addendum)
Patient requested to be faxed to pharmacy 

## 2015-07-12 NOTE — Discharge Instructions (Signed)
[facility] ______[facility] °LAPAROSCOPIC SURGERY: POST OP INSTRUCTIONS °Always review your discharge instruction sheet given to you by the facility where your surgery was performed. °IF YOU HAVE DISABILITY OR FAMILY LEAVE FORMS, YOU MUST BRING THEM TO THE OFFICE FOR PROCESSING.   °DO NOT GIVE THEM TO YOUR DOCTOR. ° °1. A prescription for pain medication may be given to you upon discharge.  Take your pain medication as prescribed, if needed.  If narcotic pain medicine is not needed, then you may take acetaminophen (Tylenol) or ibuprofen (Advil) as needed. °2. Take your usually prescribed medications unless otherwise directed. °3. If you need a refill on your pain medication, please contact your pharmacy.  They will contact our office to request authorization. Prescriptions will not be filled after 5pm or on week-ends. °4. You should follow a light diet the first few days after arrival home, such as soup and crackers, etc.  Be sure to include lots of fluids daily. °5. Most patients will experience some swelling and bruising in the area of the incisions.  Ice packs will help.  Swelling and bruising can take several days to resolve.  °6. It is common to experience some constipation if taking pain medication after surgery.  Increasing fluid intake and taking a stool softener (such as Colace) will usually help or prevent this problem from occurring.  A mild laxative (Milk of Magnesia or Miralax) should be taken according to package instructions if there are no bowel movements after 48 hours. °7. Unless discharge instructions indicate otherwise, you may remove your bandages 24-48 hours after surgery, and you may shower at that time.  You may have steri-strips (small skin tapes) in place directly over the incision.  These strips should be left on the skin for 7-10 days.  If your surgeon used skin glue on the incision, you may shower in 24 hours.  The glue will flake off over the next 2-3 weeks.  Any sutures or  staples will be removed at the office during your follow-up visit. °8. ACTIVITIES:  You may resume regular (light) daily activities beginning the next day--such as daily self-care, walking, climbing stairs--gradually increasing activities as tolerated.  You may have sexual intercourse when it is comfortable.  Refrain from any heavy lifting or straining until approved by your doctor. °a. You may drive when you are no longer taking prescription pain medication, you can comfortably wear a seatbelt, and you can safely maneuver your car and apply brakes. °b. RETURN TO WORK:  __________________________________________________________ °9. You should see your doctor in the office for a follow-up appointment approximately 2-3 weeks after your surgery.  Make sure that you call for this appointment within a day or two after you arrive home to insure a convenient appointment time. °10. OTHER INSTRUCTIONS: __________________________________________________________________________________________________________________________ __________________________________________________________________________________________________________________________ °WHEN TO CALL YOUR DOCTOR: °1. Fever over 101.0 °2. Inability to urinate °3. Continued bleeding from incision. °4. Increased pain, redness, or drainage from the incision. °5. Increasing abdominal pain ° °The clinic staff is available to answer your questions during regular business hours.  Please don’t hesitate to call and ask to speak to one of the nurses for clinical concerns.  If you have a medical emergency, go to the nearest emergency room or call 911.  A surgeon from [facility] is always on call at the hospital. °[address], [address], [place], [place]  [address] ? [address] [address], [place],    [address] °[phone] ? [phone] ? [phone] [phone] °Web site:   [email or web address] °

## 2015-07-12 NOTE — Progress Notes (Signed)
Patient ID: Toni Moses, female   DOB: 1960/03/16, 55 y.o.   MRN: [id number]   LOS: 3 days   Subjective: Doing well, pain controlled   Objective: Vital signs in last 24 hours: Temp:  [97.5 F (36.4 C)-98.8 F (37.1 C)] 98.1 F (36.7 C) (06/22 0540) Pulse Rate:  [59-77] 61 (06/22 0540) Resp:  [11-18] 18 (06/22 0540) BP: (125-167)/(66-90) 141/66 mmHg (06/22 0540) SpO2:  [94 %-100 %] 97 % (06/22 0540) Last BM Date: 07/09/15   Physical Exam General appearance: alert and no distress Resp: clear to auscultation bilaterally Cardio: regular rate and rhythm GI: Soft, +BS, port sites WNL, appropriate TTP   Assessment/Plan: POD#1 s/p lap chole 2/2 acute cholecystitis-MRCP negative for CBD stone. Will proceed with laparoscopic cholecystectomy with IOC today or tomorrow pending OR schedule. Surgical risks discussed including infection, bleeding, injury to surrounding structures, heart attack, DVT/PE, respiratory compromise and death. The patient verbalizes understanding and wishes to proceed.  Right adrenal adenoma- F/u w/PCP Right hepatic hemangioma and hepatic cyst-OP follow up.  Enlarged retroperitoneal lymph nodes-patient is aware. Discussed that it has decreased since 2013. She understands to follow up with PCP for an outpatient CT scan and further follow up.  Dispo-D/C home    Lisette Abu, PA-C Pager: [phone] General Trauma PA Pager: [phone]  07/12/2015

## 2015-07-19 ENCOUNTER — Ambulatory Visit (INDEPENDENT_AMBULATORY_CARE_PROVIDER_SITE_OTHER): Payer: BLUE CROSS/BLUE SHIELD | Admitting: Internal Medicine

## 2015-07-19 ENCOUNTER — Encounter: Payer: Self-pay | Admitting: Internal Medicine

## 2015-07-19 VITALS — BP 140/80 | HR 77 | Temp 97.8°F | Wt 171.0 lb

## 2015-07-19 DIAGNOSIS — E039 Hypothyroidism, unspecified: Secondary | ICD-10-CM

## 2015-07-19 DIAGNOSIS — D3501 Benign neoplasm of right adrenal gland: Secondary | ICD-10-CM | POA: Diagnosis not present

## 2015-07-19 DIAGNOSIS — Z9049 Acquired absence of other specified parts of digestive tract: Secondary | ICD-10-CM

## 2015-07-19 DIAGNOSIS — K8000 Calculus of gallbladder with acute cholecystitis without obstruction: Secondary | ICD-10-CM

## 2015-07-19 DIAGNOSIS — I10 Essential (primary) hypertension: Secondary | ICD-10-CM

## 2015-07-19 MED ORDER — OXYCODONE-ACETAMINOPHEN 5-325 MG PO TABS: 1.0000 | ORAL_TABLET | ORAL | Status: DC | PRN

## 2015-07-19 MED ORDER — LEVOTHYROXINE SODIUM 150 MCG PO TABS: 150.0000 ug | ORAL_TABLET | Freq: Every morning | ORAL | Status: DC

## 2015-07-19 NOTE — Progress Notes (Signed)
Location:  [facility] clinic Provider: Brisia Schuermann L. Mariea Clonts, D.O., C.M.D.  Code Status: full code Goals of Care:  Advanced Directives 07/19/2015  Does patient have an advance directive? No  Would patient like information on creating an advanced directive? -     Chief Complaint  Patient presents with  . Hospitalization Follow-up    FMLA paperwork    HPI: Patient is a 55 y.o. female seen today for hospital follow-up s/p admission from 07/09/15-07/12/15 for RUQ pain radiating around to her back.  She had stones and a dilated CBD.  She underwent MRCP w/o CBD stones.  She was taken to the OR and underwent lap chole.  She had an incidental 1.5 cm right adrenal adenoma noted for which she follows up.  She also needs her FMLA paperwork completed.  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  She follows up with [facility] on 7/12.  She had enlarged upper abdominal lymph nodes which were felt to be benign reactive nodes from the cholecystitis.  The MRI of her abdomen/MRCP also showed signal dropout on out of phase images c/w adenoma.  Small size also goes against malignancy.  Pain is terrible as she lifted something she shouldn't have yesterday--watermelon.  Had gone to farmer's market and went to walk around there.  Man put it in their car, but then she brought it in at her house.    Due to midline weight gain, ? Cushing's so will check 24 hr urine cortisol.    Past Medical History  Diagnosis Date  . Thyroid disease   . Hypercholesteremia   . Anxiety   . Lumbago   . Hypertension   . Enlargement of lymph nodes   . Benign neoplasm of lymph nodes   . Other atopic dermatitis and related conditions   . Other abnormal blood chemistry   . Unspecified schizophrenia, unspecified condition   . Bipolar I disorder, most recent episode (or current) unspecified   . Depression   . Myalgia and myositis,  unspecified   . Hypothyroidism   . Diabetes mellitus type 2, controlled [facility])     Past Surgical History  Procedure Laterality Date  . Abdominal hysterectomy    . Thyroidectomy    . Appendectomy    . Eye surgery Right     september  . Cholecystectomy N/A 07/11/2015    Procedure: LAPAROSCOPIC CHOLECYSTECTOMY WITH INTRAOPERATIVE CHOLANGIOGRAM;  Surgeon: Stark Klein, MD;  Location: [place];  Service: General;  Laterality: N/A;    Allergies  Allergen Reactions  . Citalopram Other (See Comments)    "made her feel bad", "foaming at the mouth"      Medication List       This list is accurate as of: 07/19/15  8:20 AM.  Always use your most recent med list.               ALPRAZolam 1 MG tablet  Commonly known as:  XANAX  take 1 tablet by mouth three times a day if needed     aspirin 81 MG chewable tablet  Chew 81 mg by mouth every 6 (six) hours as needed for moderate pain.     clobetasol cream 0.05 %  Commonly known as:  TEMOVATE  Apply 1 application topically 2 (two) times daily.     hydrocortisone 2.5 % cream  Apply topically 2 (two) times daily.     levothyroxine  150 MCG tablet  Commonly known as:  SYNTHROID, LEVOTHROID  take 1 tablet by mouth every morning     lisinopril 5 MG tablet  Commonly known as:  PRINIVIL,ZESTRIL  Take 1 tablet (5 mg total) by mouth daily.     methocarbamol 500 MG tablet  Commonly known as:  ROBAXIN  Take 1 tablet (500 mg total) by mouth 2 (two) times daily.     naproxen 500 MG tablet  Commonly known as:  NAPROSYN  Take 1 tablet (500 mg total) by mouth 2 (two) times daily with a meal. As needed     oxyCODONE-acetaminophen 5-325 MG tablet  Commonly known as:  ROXICET  Take 1-2 tablets by mouth every 4 (four) hours as needed (Pain).     QUEtiapine 50 MG tablet  Commonly known as:  SEROQUEL  Take 1 tablet (50 mg total) by mouth at bedtime.     triamcinolone cream 0.5 %  Commonly known as:  KENALOG  Apply 1 application topically 2  (two) times daily as needed (for ezcema).        Review of Systems:  Review of Systems  Constitutional: Negative for fever, chills and malaise/fatigue.  HENT: Negative for hearing loss.   Eyes: Positive for blurred vision.       Legally blind  Respiratory: Negative for shortness of breath.   Cardiovascular: Negative for chest pain and leg swelling.  Gastrointestinal: Positive for abdominal pain. Negative for diarrhea, constipation, blood in stool and melena.  Genitourinary: Negative for dysuria, urgency and frequency.  Musculoskeletal: Negative for falls.  Neurological: Negative for dizziness, loss of consciousness and weakness.  Psychiatric/Behavioral: Positive for depression. Negative for memory loss.    Health Maintenance  Topic Date Due  . Hepatitis C Screening  04/24/60  . HIV Screening  02/04/1975  . PAP SMEAR  01/21/2014  . OPHTHALMOLOGY EXAM  05/25/2014  . MAMMOGRAM  08/10/2015 (Originally 01/23/2011)  . DEXA SCAN  08/10/2015 (Originally 04/30/1960)  . INFLUENZA VACCINE  01/21/2048 (Originally 08/21/2015)  . HEMOGLOBIN A1C  11/02/2015  . FOOT EXAM  11/30/2015  . TETANUS/TDAP  01/22/2020  . PNEUMOCOCCAL POLYSACCHARIDE VACCINE (2) 05/02/2020  . COLONOSCOPY  07/14/2023    Physical Exam: Filed Vitals:   07/19/15 0815  BP: 148/80  Pulse: 77  Temp: 97.8 F (36.6 C)  TempSrc: Oral  Weight: 171 lb (77.565 kg)  SpO2: 97%   Body mass index is 28.46 kg/(m^2). Physical Exam  Constitutional: She is oriented to person, place, and time. She appears well-developed and well-nourished. No distress.  Cardiovascular: Normal rate, regular rhythm, normal heart sounds and intact distal pulses.   Pulmonary/Chest: Effort normal and breath sounds normal. No respiratory distress.  Abdominal: Soft. Bowel sounds are normal. She exhibits no distension and no mass. There is tenderness. There is no rebound and no guarding. No hernia.  RUQ; lap chole wounds healing well with glue    Musculoskeletal: Normal range of motion.  Neurological: She is alert and oriented to person, place, and time.  Skin: Skin is warm and dry.  Psoriasis of legs noted  Psychiatric: She has a normal mood and affect.    Labs reviewed: Basic Metabolic Panel:  Recent Labs  10/10/14 1706  05/03/15 0900 07/09/15 1320 07/10/15 0510  NA 145*  < > 141 138 138  K 4.5  < > 4.6 4.2 3.7  CL 107  < > 102 107 107  CO2 24  < > 24 26 25   GLUCOSE 90  < >  89 122* 86  BUN 11  < > 12 9 8   CREATININE 0.89  < > 0.92 1.19* 1.10*  CALCIUM 9.8  < > 9.4 9.6 8.8*  TSH 1.550  --  3.700  --   --   < > = values in this interval not displayed. Liver Function Tests:  Recent Labs  07/09/15 1820 07/10/15 0510  AST 28 24  ALT 18 16  ALKPHOS 90 87  BILITOT 0.6 0.5  PROT 7.5 6.9  ALBUMIN 3.5 3.2*    Recent Labs  07/09/15 1820  LIPASE 44   No results for input(s): AMMONIA in the last 8760 hours. CBC:  Recent Labs  10/10/14 1706 12/20/14 1918 05/03/15 0900 07/09/15 1320 07/10/15 0510  WBC 6.4 6.0 4.7 6.1 5.7  NEUTROABS 3.6  --  3.1  --   --   HGB  --  11.6*  --  11.7* 11.3*  HCT 34.8 35.8* 38.6 36.5 35.6*  MCV 81 86.3 85 83.9 83.8  PLT 374 322 355 305 292   Lipid Panel:  Recent Labs  05/03/15 0900  CHOL 210*  HDL 87  LDLCALC 103*  TRIG 100  CHOLHDL 2.4   Lab Results  Component Value Date   HGBA1C 6.0* 05/03/2015    Procedures since last visit: Dg Chest 2 View  07/09/2015  CLINICAL DATA:  Chest pain since last night, worsening today EXAM: CHEST  2 VIEW COMPARISON:  Chest x-ray and chest CT 12/20/2014 FINDINGS: Linear scarring at the left base. 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 No confluent airspace opacity or effusion. Heart is normal size. No acute bony abnormality. IMPRESSION: No active cardiopulmonary disease. Electronically Signed   By: Rolm Baptise M.D.   On: 07/09/2015 14:03   Mr 3d Recon At Scanner  07/10/2015  ADDENDUM REPORT: 07/10/2015 21:29 ADDENDUM: I  have reviewed this study and agree with the interpretation. The proximal common bile duct dilatation appears stable when compared to a prior abdominal CT scan from 2013. No common bile duct stones are identified. Findings suggest acute cholecystitis. Slight interval enlargement of upper abdominal lymph nodes since the prior CT scan but these are most likely benign reactive lymph nodes. Electronically Signed   By: Marijo Sanes M.D.   On: 07/10/2015 21:29  07/10/2015  CLINICAL DATA:  55 year old female with right upper quadrant abdominal pain and nausea. EXAM: MRI ABDOMEN WITHOUT AND WITH CONTRAST (INCLUDING MRCP) TECHNIQUE: Multiplanar multisequence MR imaging of the abdomen was performed both before and after the administration of intravenous contrast. Heavily T2-weighted images of the biliary and pancreatic ducts were obtained, and three-dimensional MRCP images were rendered by post processing. CONTRAST:  21mL MULTIHANCE GADOBENATE DIMEGLUMINE 529 MG/ML IV SOLN COMPARISON:  Stop right upper quadrant ultrasound dated 07/09/2015 FINDINGS: Evaluation of this exam is limited due to respiratory motion artifact. There is a 11 mm low T1 high T2 signal intensity lesion in the right lobe of the liver posteriorly. This lesion demonstrates an irregular and nodular peripheral enhancement and most compatible with a hemangioma. A subcentimeter low T1/high T2 subcapsular lesion in the right lobe of the liver inferiorly does not demonstrate enhancement and represents a cyst. There are multiple stones within the gallbladder. There is enhancement of the gallbladder mucosa. There is diffuse edema and thickening of the gallbladder wall. No significant pericholecystic fluid identified. There is focal dilatation of the common bile duct measuring up to 14 cm. There is normal tapering of the CBD at the head of the pancreas. No  obstructing stone or lesions identified. The pancreas appears unremarkable with normal enhancement pattern. A 6  mm high T2/low T1 signal intensity in the proximal body of the pancreas with no enhancement likely represents a side branch IPMN retention cyst. There is no dilatation of the main pancreatic duct. The spleen, and the left adrenal gland appear unremarkable. There is a 1.5 cm nodule in the right adrenal gland with signal dropout on out of phase images most compatible with an adenoma. There is no hydronephrosis on either side. There is an 11 mm right renal interpolar cyst. The aorta is unremarkable. There multiple mildly enlarged lymph nodes adjacent to the head of the pancreas. An enlarged lymph node in the gastrohepatic space measures approximately 13 mm in short axis. A portacaval lymph node measures approximately 12 mm in short axis. Mildly enlarged right axillary lymph nodes. IMPRESSION: Cholelithiasis with gallbladder wall thickening and edema most compatible with an acute cholecystitis. A hepatobiliary scintigraphy may provide better evaluation of the gallbladder and its functional contractility. No stone identified in the common bile duct. Small right hepatic hemangioma as well as a small cyst in the right lobe of the liver. 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 Small right renal cyst.  No hydronephrosis. A 1.5 cm right adrenal adenoma. Enlarged retroperitoneal lymph nodes in the portacaval region as well as enlarged lymph node in the gastrohepatic space of indeterminate etiology. These lymph nodes appear similar to chest CT dated 12/20/2014 but have increased in size since the study dated 11/27/2011. Although these may be reactive, Underlying malignancy or metastatic disease is not excluded. Clinical correlation is recommended. Nonemergent CT of the abdomen and pelvis is recommended for complete evaluation. Electronically Signed: By: Anner Crete M.D. On: 07/10/2015 00:32   US Abdomen Limited  07/09/2015  CLINICAL DATA:  Acute onset of right upper quadrant abdominal pain.  Initial encounter. EXAM: US ABDOMEN LIMITED - RIGHT UPPER QUADRANT COMPARISON:  Abdominal ultrasound performed 09/03/2010, and CT of the chest, abdomen and pelvis performed 11/27/2011 FINDINGS: Gallbladder: Stones are seen dependently within the gallbladder, measuring up to 7 mm. Gallbladder wall thickening is noted, measuring up to 6 mm. No pericholecystic fluid is seen. No ultrasonographic Murphy's sign is elicited. Common bile duct: Diameter: 1.4 cm, significantly dilated. Liver: A small 1.6 cm hyperechoic focus within the inferior right hepatic lobe likely reflects a small hemangioma. The liver is otherwise unremarkable in appearance, demonstrating normal parenchymal echogenicity. A small right renal cyst is noted. IMPRESSION: 1. Cholelithiasis, with diffuse gallbladder wall thickening and a significantly dilated common bile duct. This raises concern for distal obstruction. MRCP or ERCP would be helpful for further evaluation. 2. 1.6 cm hyperechoic focus within the inferior right hepatic lobe likely reflects a small hemangioma. 3. Small right renal cyst noted. Electronically Signed   By: Garald Balding M.D.   On: 07/09/2015 19:35   Mr Jeananne Rama W/wo Cm/mrcp  07/10/2015  ADDENDUM REPORT: 07/10/2015 21:29 ADDENDUM: I have reviewed this study and agree with the interpretation. The proximal common bile duct dilatation appears stable when compared to a prior abdominal CT scan from 2013. No common bile duct stones are identified. Findings suggest acute cholecystitis. Slight interval enlargement of upper abdominal lymph nodes since the prior CT scan but these are most likely benign reactive lymph nodes. Electronically Signed   By: Marijo Sanes M.D.   On: 07/10/2015 21:29  07/10/2015  CLINICAL DATA:  55 year old female with right upper quadrant abdominal pain and nausea.  EXAM: MRI ABDOMEN WITHOUT AND WITH CONTRAST (INCLUDING MRCP) TECHNIQUE: Multiplanar multisequence MR imaging of the abdomen was performed both before  and after the administration of intravenous contrast. Heavily T2-weighted images of the biliary and pancreatic ducts were obtained, and three-dimensional MRCP images were rendered by post processing. CONTRAST:  76mL MULTIHANCE GADOBENATE DIMEGLUMINE 529 MG/ML IV SOLN COMPARISON:  Stop right upper quadrant ultrasound dated 07/09/2015 FINDINGS: Evaluation of this exam is limited due to respiratory motion artifact. There is a 11 mm low T1 high T2 signal intensity lesion in the right lobe of the liver posteriorly. This lesion demonstrates an irregular and nodular peripheral enhancement and most compatible with a hemangioma. A subcentimeter low T1/high T2 subcapsular lesion in the right lobe of the liver inferiorly does not demonstrate enhancement and represents a cyst. There are multiple stones within the gallbladder. There is enhancement of the gallbladder mucosa. There is diffuse edema and thickening of the gallbladder wall. No significant pericholecystic fluid identified. There is focal dilatation of the common bile duct measuring up to 14 cm. There is normal tapering of the CBD at the head of the pancreas. No obstructing stone or lesions identified. The pancreas appears unremarkable with normal enhancement pattern. A 6 mm high T2/low T1 signal intensity in the proximal body of the pancreas with no enhancement likely represents a side branch IPMN retention cyst. There is no dilatation of the main pancreatic duct. The spleen, and the left adrenal gland appear unremarkable. There is a 1.5 cm nodule in the right adrenal gland with signal dropout on out of phase images most compatible with an adenoma. There is no hydronephrosis on either side. There is an 11 mm right renal interpolar cyst. The aorta is unremarkable. There multiple mildly enlarged lymph nodes adjacent to the head of the pancreas. An enlarged lymph node in the gastrohepatic space measures approximately 13 mm in short axis. A portacaval lymph node measures  approximately 12 mm in short axis. Mildly enlarged right axillary lymph nodes. IMPRESSION: Cholelithiasis with gallbladder wall thickening and edema most compatible with an acute cholecystitis. A hepatobiliary scintigraphy may provide better evaluation of the gallbladder and its functional contractility. No stone identified in the common bile duct. Small right hepatic hemangioma as well as a small cyst in the right lobe of the liver. 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 Small right renal cyst.  No hydronephrosis. A 1.5 cm right adrenal adenoma. Enlarged retroperitoneal lymph nodes in the portacaval region as well as enlarged lymph node in the gastrohepatic space of indeterminate etiology. These lymph nodes appear similar to chest CT dated 12/20/2014 but have increased in size since the study dated 11/27/2011. Although these may be reactive, Underlying malignancy or metastatic disease is not excluded. Clinical correlation is recommended. Nonemergent CT of the abdomen and pelvis is recommended for complete evaluation. Electronically Signed: By: Anner Crete M.D. On: 07/10/2015 00:32    Assessment/Plan 1. Adrenal adenoma, right - appears benign based on MRI characteristics including small size, washout - does have some abdominal obesity/Cushing's appearance so will r/o with 24 hr urine cortisol on 7/7 when new lab begins (pt to come to pick up urine jug) - Cortisol, urine, 24 hour; Future  2. Calculus of gallbladder with acute cholecystitis without obstruction - s/p chole, recovering gradually, still with pain on right side of abdomen, but incision sites look great - oxyCODONE-acetaminophen (ROXICET) 5-325 MG tablet; Take 1-2 tablets by mouth every 4 (four) hours as needed (Pain).  Dispense: 30 tablet; Refill: 0  3. S/P laparoscopic cholecystectomy - see #2 - oxyCODONE-acetaminophen (ROXICET) 5-325 MG tablet; Take 1-2 tablets by mouth every 4 (four) hours as needed  (Pain).  Dispense: 30 tablet; Refill: 0  4. Hypothyroidism, unspecified hypothyroidism type - last tsh was normal, cont same synthroid - levothyroxine (SYNTHROID, LEVOTHROID) 150 MCG tablet; Take 1 tablet (150 mcg total) by mouth every morning.  Dispense: 90 tablet; Refill: 3  5. Essential hypertension -bp at upper limits of normal today--recheck still 140 so will consider adjustment if persists when pain resolved  FMLA PAPERWORK COMPLETED FOR ONE MONTH OUT OF WORK, COPY GIVEN to her  Labs/tests ordered:   Orders Placed This Encounter  Procedures  . Cortisol, urine, 24 hour    Standing Status: Future     Number of Occurrences:      Standing Expiration Date: 07/18/2016    Next appt:  08/09/2015 regular visit  Felisha Claytor L. Curlie Sittner, D.O. [facility] [address], [place] [address] Cell Phone (Mon-Fri 8am-5pm):  [phone] On Call:  [phone] & follow prompts after 5pm & weekends Office Phone:  [phone] Office Fax:  [phone]

## 2015-08-07 ENCOUNTER — Telehealth: Payer: Self-pay | Admitting: *Deleted

## 2015-08-07 NOTE — Telephone Encounter (Signed)
Received fax from [employer] Form for patient to fill out and fax back to Fax: [phone] ([place]). Filled out and given to Dr. Mariea Clonts to review and complete and sign. Attached Demographics and last ov note.

## 2015-08-08 ENCOUNTER — Telehealth: Payer: Self-pay

## 2015-08-08 NOTE — Telephone Encounter (Signed)
Patient called requesting pain medication. Patient states this is her first day back to work and she is experiencing pain on her right side status post gallbladder and kidney stone removal. Patient rates pain 8 (1-10).  Please advise

## 2015-08-09 ENCOUNTER — Encounter: Payer: BC Managed Care – PPO | Admitting: Internal Medicine

## 2015-08-09 ENCOUNTER — Other Ambulatory Visit: Payer: Self-pay | Admitting: Internal Medicine

## 2015-08-09 NOTE — Telephone Encounter (Signed)
Left message on voicemail with Dr.Reed's response. Patient instructed to call if she has questions or concerns

## 2015-08-09 NOTE — Telephone Encounter (Signed)
Pt should no longer be having this much pain to require strong pain medications.  I suggest she use tylenol or ibuprofen right now and if these do not control her pain, she should follow up with her surgeon to determine why she still hurts so much.

## 2015-08-10 NOTE — Telephone Encounter (Signed)
Spoke with patient, patient with no additional concerns. Patient also informed that Short-term disability paperwork was faxed, patient requested copy to be mailed to her also, copy mailed. Copy of repost sent to scanning as well.

## 2015-08-13 ENCOUNTER — Other Ambulatory Visit: Payer: Self-pay | Admitting: *Deleted

## 2015-08-13 MED ORDER — ALPRAZOLAM 1 MG PO TABS: ORAL_TABLET | ORAL | 0 refills | Status: DC

## 2015-08-13 NOTE — Telephone Encounter (Signed)
Patient requested to be faxed to pharmacy 

## 2015-08-23 ENCOUNTER — Other Ambulatory Visit: Payer: Self-pay | Admitting: Internal Medicine

## 2015-08-23 ENCOUNTER — Emergency Department (HOSPITAL_COMMUNITY)
Admission: EM | Admit: 2015-08-23 | Discharge: 2015-08-23 | Disposition: A | Discharge status: Home / Self Care | Payer: BLUE CROSS/BLUE SHIELD | Attending: Emergency Medicine | Admitting: Emergency Medicine

## 2015-08-23 ENCOUNTER — Encounter (HOSPITAL_COMMUNITY): Payer: Self-pay | Admitting: Emergency Medicine

## 2015-08-23 DIAGNOSIS — E119 Type 2 diabetes mellitus without complications: Secondary | ICD-10-CM | POA: Insufficient documentation

## 2015-08-23 DIAGNOSIS — F17210 Nicotine dependence, cigarettes, uncomplicated: Secondary | ICD-10-CM | POA: Diagnosis not present

## 2015-08-23 DIAGNOSIS — E039 Hypothyroidism, unspecified: Secondary | ICD-10-CM | POA: Insufficient documentation

## 2015-08-23 DIAGNOSIS — G43909 Migraine, unspecified, not intractable, without status migrainosus: Secondary | ICD-10-CM | POA: Diagnosis not present

## 2015-08-23 DIAGNOSIS — L309 Dermatitis, unspecified: Secondary | ICD-10-CM

## 2015-08-23 DIAGNOSIS — R040 Epistaxis: Secondary | ICD-10-CM | POA: Insufficient documentation

## 2015-08-23 DIAGNOSIS — Z5321 Procedure and treatment not carried out due to patient leaving prior to being seen by health care provider: Secondary | ICD-10-CM | POA: Insufficient documentation

## 2015-08-23 DIAGNOSIS — E1142 Type 2 diabetes mellitus with diabetic polyneuropathy: Secondary | ICD-10-CM

## 2015-08-23 DIAGNOSIS — I10 Essential (primary) hypertension: Secondary | ICD-10-CM | POA: Insufficient documentation

## 2015-08-23 NOTE — ED Triage Notes (Signed)
Pt. reports epistaxis at left nares onset last night , no bleeding at arrival , pt. added mild headache , denies nasal injury , no fever or chills.

## 2015-08-23 NOTE — ED Notes (Signed)
Called pt for to come back to room with no response, went to the next person.

## 2015-08-23 NOTE — ED Notes (Signed)
Pt called to come back to a room, no response, pt is not seen in the lobby. Moved OTF

## 2015-09-12 ENCOUNTER — Other Ambulatory Visit: Payer: Self-pay

## 2015-09-12 MED ORDER — ALPRAZOLAM 1 MG PO TABS: ORAL_TABLET | ORAL | 0 refills | Status: DC

## 2015-10-15 ENCOUNTER — Other Ambulatory Visit: Payer: Self-pay | Admitting: *Deleted

## 2015-10-15 MED ORDER — ALPRAZOLAM 1 MG PO TABS: ORAL_TABLET | ORAL | 0 refills | Status: DC

## 2015-10-15 NOTE — Telephone Encounter (Signed)
Patient requested. Patient needs an appointment for CPE

## 2015-11-14 ENCOUNTER — Other Ambulatory Visit: Payer: Self-pay | Admitting: *Deleted

## 2015-11-14 MED ORDER — ALPRAZOLAM 1 MG PO TABS: ORAL_TABLET | ORAL | 0 refills | Status: DC

## 2015-11-14 NOTE — Telephone Encounter (Signed)
Patient requested, Faxed.

## 2015-11-19 ENCOUNTER — Encounter: Payer: Self-pay | Admitting: Internal Medicine

## 2015-11-19 ENCOUNTER — Ambulatory Visit: Payer: Self-pay | Admitting: Internal Medicine

## 2015-11-19 DIAGNOSIS — Z0289 Encounter for other administrative examinations: Secondary | ICD-10-CM

## 2016-01-26 ENCOUNTER — Other Ambulatory Visit: Payer: Self-pay | Admitting: Internal Medicine

## 2016-01-26 DIAGNOSIS — L309 Dermatitis, unspecified: Secondary | ICD-10-CM

## 2016-02-04 ENCOUNTER — Other Ambulatory Visit: Payer: Self-pay | Admitting: Internal Medicine

## 2016-02-04 NOTE — Telephone Encounter (Signed)
Pt needs appointment.  She and her husband both are missing appointments and requesting controlled substances.  They must be seen or they won't get them.

## 2016-04-10 ENCOUNTER — Ambulatory Visit: Payer: Self-pay | Admitting: Internal Medicine

## 2016-04-14 ENCOUNTER — Other Ambulatory Visit: Payer: Self-pay | Admitting: *Deleted

## 2016-04-14 DIAGNOSIS — F203 Undifferentiated schizophrenia: Secondary | ICD-10-CM

## 2016-04-14 DIAGNOSIS — E1142 Type 2 diabetes mellitus with diabetic polyneuropathy: Secondary | ICD-10-CM

## 2016-04-14 MED ORDER — CLOBETASOL PROPIONATE 0.05 % EX CREA: 1.0000 "application " | TOPICAL_CREAM | Freq: Two times a day (BID) | CUTANEOUS | 0 refills | Status: AC

## 2016-04-14 MED ORDER — LEVOTHYROXINE SODIUM 150 MCG PO TABS: 150.0000 ug | ORAL_TABLET | Freq: Every morning | ORAL | 0 refills | Status: DC

## 2016-04-14 MED ORDER — ALPRAZOLAM 1 MG PO TABS: ORAL_TABLET | ORAL | 0 refills | Status: AC

## 2016-04-14 MED ORDER — LISINOPRIL 5 MG PO TABS: 5.0000 mg | ORAL_TABLET | Freq: Every day | ORAL | 0 refills | Status: DC

## 2016-04-14 MED ORDER — QUETIAPINE FUMARATE 50 MG PO TABS
50.0000 mg | ORAL_TABLET | Freq: Every day | ORAL | 0 refills | Status: DC
Start: 2016-04-14 — End: 2018-12-17

## 2016-04-14 MED ORDER — TRIAMCINOLONE ACETONIDE 0.5 % EX CREA: 1.0000 "application " | TOPICAL_CREAM | Freq: Two times a day (BID) | CUTANEOUS | 0 refills | Status: AC | PRN

## 2016-04-14 NOTE — Telephone Encounter (Signed)
Faxed 30 day supply of patient's medications to pharmacy due to patient being discharged from practice per Caren Griffins.

## 2016-04-28 ENCOUNTER — Encounter: Payer: Self-pay | Admitting: Internal Medicine

## 2017-06-01 IMAGING — DX DG CHEST 2V
2 series · 2 of 2 positions shown · non-contrast
Comparison: Chest x-ray and chest CT 12/20/2014

CLINICAL DATA: Chest pain since last night, worsening today

EXAM:
CHEST  2 VIEW

[w chest lat]
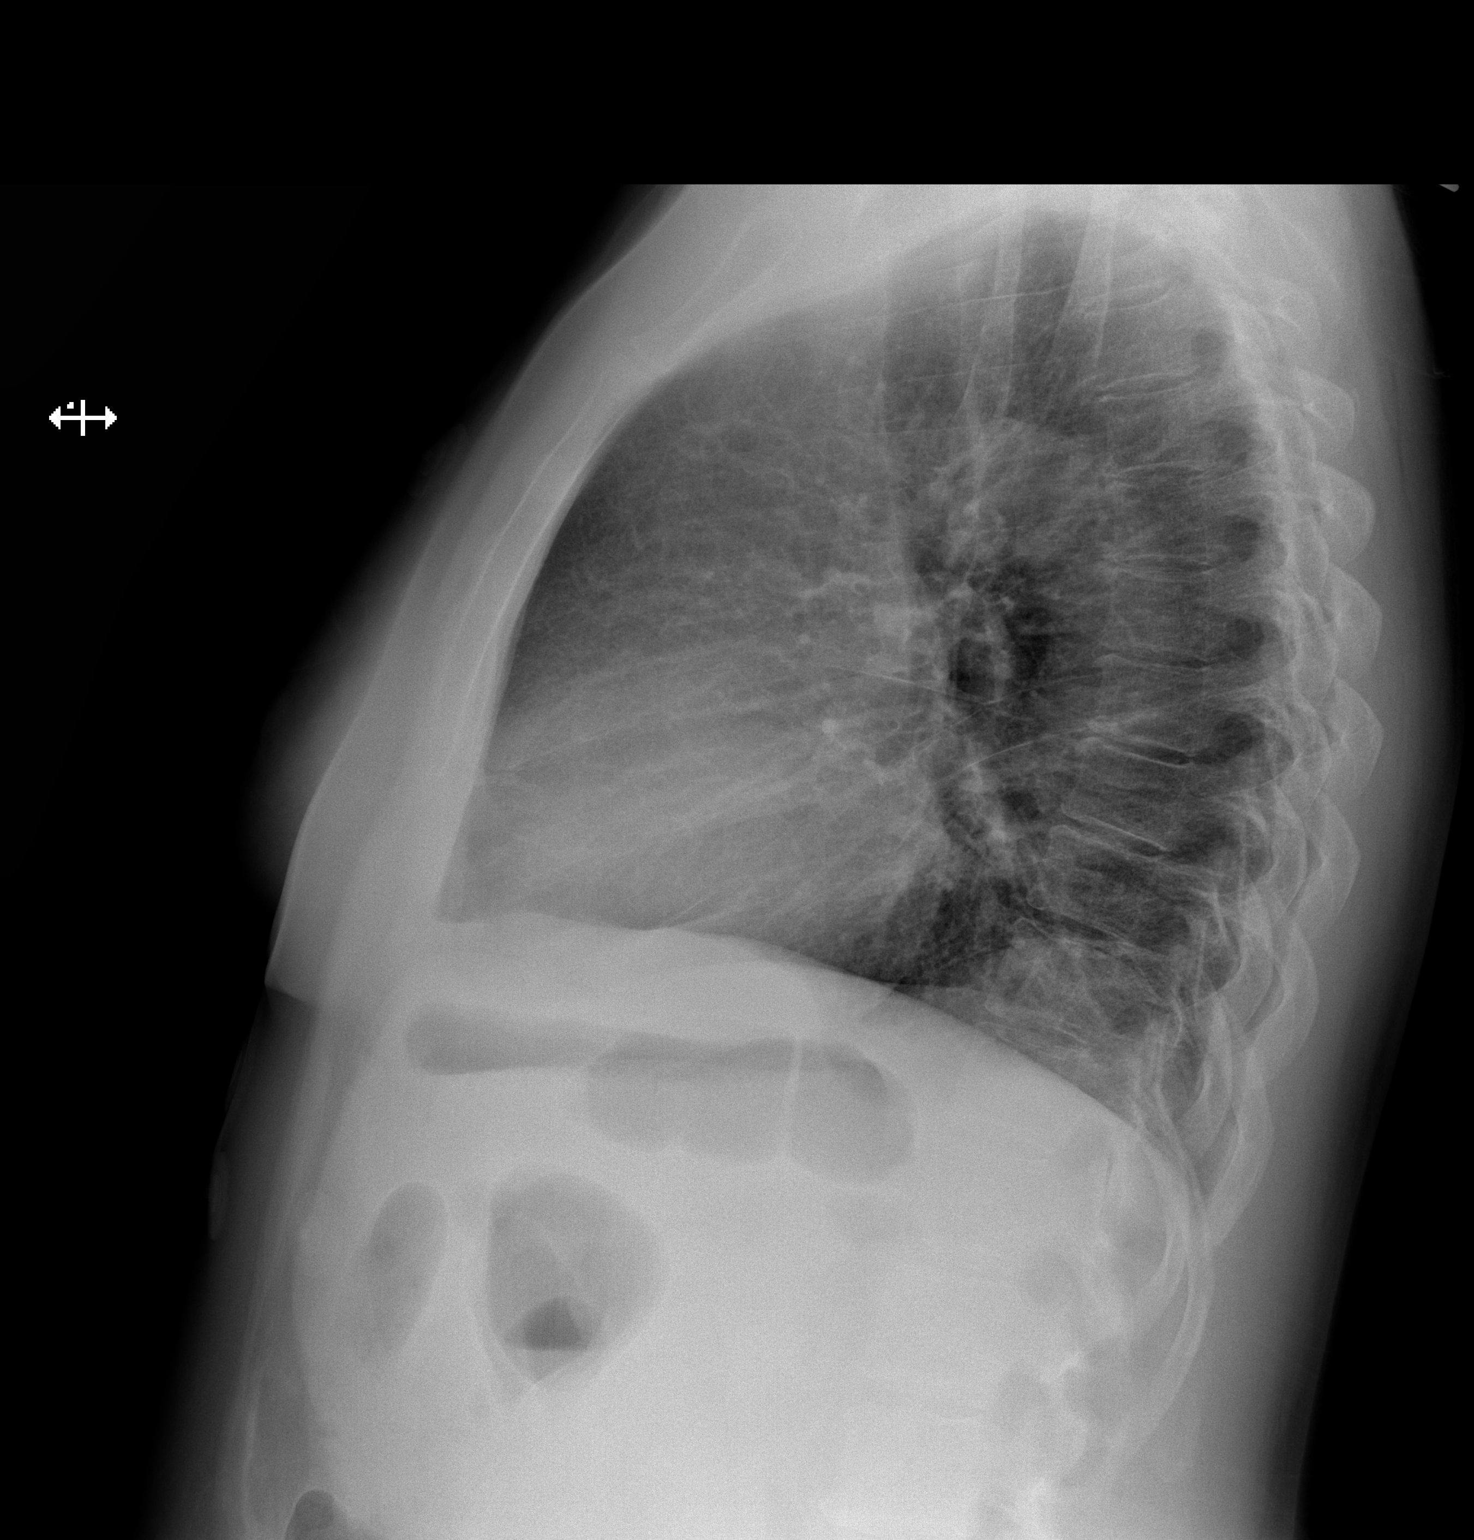

[w chest pa]
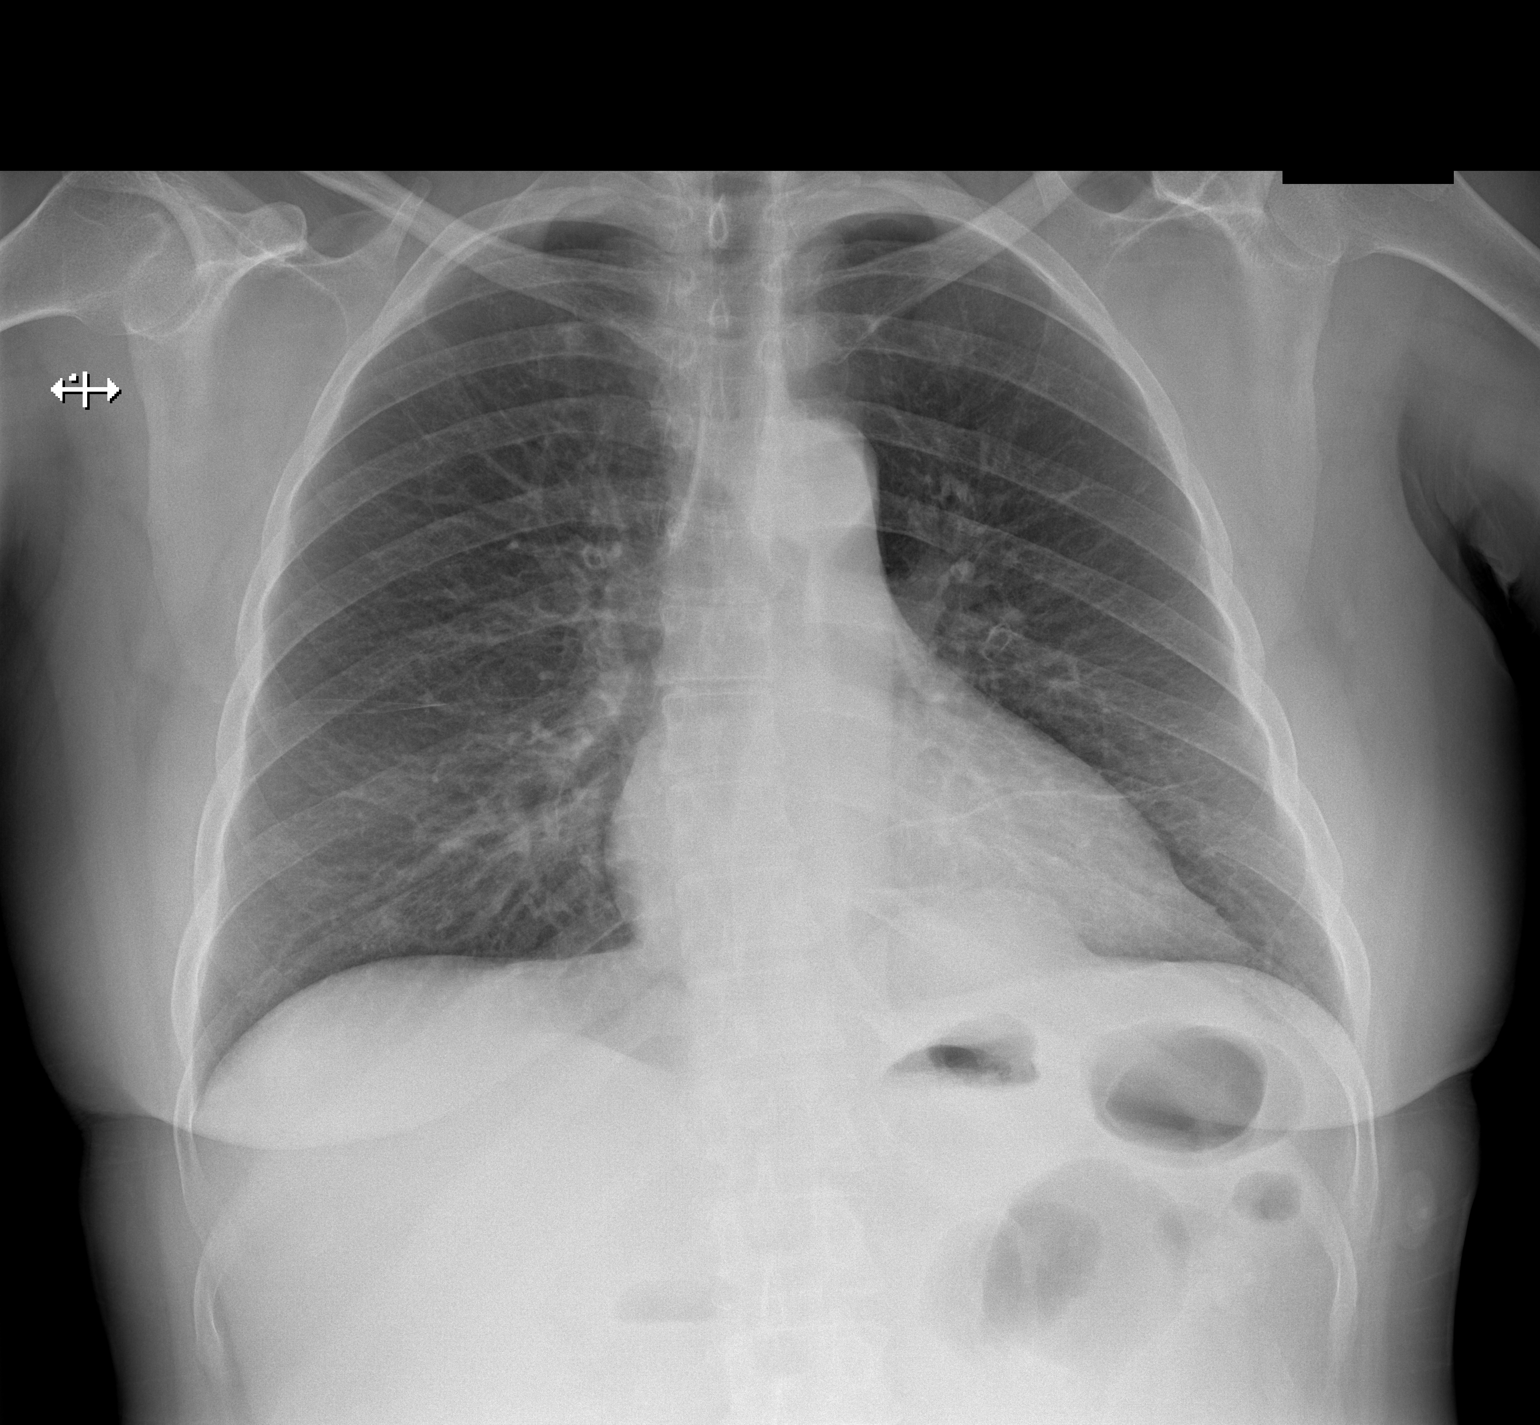

[2 of 2 positions shown; findings below may reference images not displayed]

FINDINGS: Linear scarring at the left base. Posterior diaphragmatic hernia on
the left. No confluent airspace opacity or effusion. Heart is normal
size. No acute bony abnormality.
IMPRESSION: No active cardiopulmonary disease.

## 2018-12-06 ENCOUNTER — Emergency Department (HOSPITAL_COMMUNITY)
Admission: EM | Admit: 2018-12-06 | Discharge: 2018-12-06 | Disposition: A | Discharge status: Home / Self Care | Payer: Worker's Compensation | Attending: Emergency Medicine | Admitting: Emergency Medicine

## 2018-12-06 ENCOUNTER — Encounter (HOSPITAL_COMMUNITY): Payer: Self-pay | Admitting: Emergency Medicine

## 2018-12-06 ENCOUNTER — Other Ambulatory Visit: Payer: Self-pay

## 2018-12-06 ENCOUNTER — Emergency Department (HOSPITAL_COMMUNITY): Payer: Worker's Compensation

## 2018-12-06 DIAGNOSIS — E039 Hypothyroidism, unspecified: Secondary | ICD-10-CM | POA: Insufficient documentation

## 2018-12-06 DIAGNOSIS — W19XXXA Unspecified fall, initial encounter: Secondary | ICD-10-CM

## 2018-12-06 DIAGNOSIS — E119 Type 2 diabetes mellitus without complications: Secondary | ICD-10-CM | POA: Diagnosis not present

## 2018-12-06 DIAGNOSIS — W109XXA Fall (on) (from) unspecified stairs and steps, initial encounter: Secondary | ICD-10-CM | POA: Diagnosis not present

## 2018-12-06 DIAGNOSIS — Y9301 Activity, walking, marching and hiking: Secondary | ICD-10-CM | POA: Diagnosis not present

## 2018-12-06 DIAGNOSIS — I10 Essential (primary) hypertension: Secondary | ICD-10-CM | POA: Diagnosis not present

## 2018-12-06 DIAGNOSIS — Z79899 Other long term (current) drug therapy: Secondary | ICD-10-CM | POA: Insufficient documentation

## 2018-12-06 DIAGNOSIS — F17210 Nicotine dependence, cigarettes, uncomplicated: Secondary | ICD-10-CM | POA: Insufficient documentation

## 2018-12-06 DIAGNOSIS — S82852A Displaced trimalleolar fracture of left lower leg, initial encounter for closed fracture: Secondary | ICD-10-CM

## 2018-12-06 DIAGNOSIS — S99912A Unspecified injury of left ankle, initial encounter: Secondary | ICD-10-CM | POA: Diagnosis present

## 2018-12-06 DIAGNOSIS — Y999 Unspecified external cause status: Secondary | ICD-10-CM | POA: Diagnosis not present

## 2018-12-06 DIAGNOSIS — Y929 Unspecified place or not applicable: Secondary | ICD-10-CM | POA: Diagnosis not present

## 2018-12-06 MED ORDER — HYDROCODONE-ACETAMINOPHEN 5-325 MG PO TABS: 1.0000 | ORAL_TABLET | Freq: Four times a day (QID) | ORAL | 0 refills | Status: AC | PRN

## 2018-12-06 MED ORDER — HYDROCODONE-ACETAMINOPHEN 5-325 MG PO TABS
2.0000 | ORAL_TABLET | Freq: Once | ORAL | Status: AC
  Administered 2018-12-06: 2 via ORAL
  Filled 2018-12-06: qty 2

## 2018-12-06 MED ORDER — HYDROCODONE-ACETAMINOPHEN 5-325 MG PO TABS: 1.0000 | ORAL_TABLET | Freq: Four times a day (QID) | ORAL | 0 refills | Status: DC | PRN

## 2018-12-06 NOTE — Progress Notes (Signed)
TOC CM spoke to pt and states she lives alone. States she will need Knee Walker. States she fell at work but will follow up with employer about [employer]. Provided pt with Rx for Knee Gilford Rile, wheelchair and 3n1 bedside commode to take to [employer] store. States she will have her niece go by retail store to pick up DME. [place], [place] ED TOC CM [phone]

## 2018-12-06 NOTE — ED Provider Notes (Signed)
.      Durable Medical Equipment  (From admission, onward)         Start     Ordered   12/06/18 1502  For home use only DME standard manual wheelchair with seat cushion  Once    Comments: Patient suffers from left ankle fracture which impairs their ability to perform daily activities like bathing, standing, cleaning in the home.  A rolling walker will not resolve issue with performing activities of daily living. A wheelchair will allow patient to safely perform daily activities. Patient can safely propel the wheelchair in the home or has a caregiver who can provide assistance. Length of need  Lif[employer] Accessories: elevating leg rests (ELRs), wheel locks, extensions and anti-tippers, back cushion.   12/06/18 1504   12/06/18 1501  For home use only DME 3 n 1  Once     12/06/18 1504   12/06/18 1501  For home use only DME Walker rolling  Once    Comments: Knee Walker  Question:  Patient needs a walker to treat with the following condition  Answer:  Closed left ankle fracture   12/06/18 [address] 12/06/18 1508    Dorie Rank, MD 12/07/18 [phone]

## 2018-12-06 NOTE — ED Notes (Signed)
Pt calling to see if niece can pick her up.

## 2018-12-06 NOTE — ED Provider Notes (Signed)
[facility] DEPT Provider Note   CSN: [id number] Arrival date & time: 12/06/18  1150     History   Chief Complaint Chief Complaint  Patient presents with  . Ankle Pain    HPI Toni Moses is a 58 y.o. female.     The history is provided by the patient. No language interpreter was used.  Ankle Pain Location:  Ankle Injury: yes   Mechanism of injury: fall   Fall:    Fall occurred:  Down stairs   Entrapped after fall: no   Ankle location:  L ankle Pain details:    Quality:  Aching   Radiates to:  Does not radiate   Severity:  Moderate   Onset quality:  Gradual   Timing:  Constant   Progression:  Worsening Dislocation: no   Foreign body present:  No foreign bodies Prior injury to area:  No Worsened by:  Nothing Ineffective treatments:  None tried Associated symptoms: swelling   Risk factors: no concern for non-accidental trauma     Past Medical History:  Diagnosis Date  . Anxiety   . Benign neoplasm of lymph nodes   . Bipolar I disorder, most recent episode (or current) unspecified   . Depression   . Diabetes mellitus type 2, controlled ([place])   . Enlargement of lymph nodes   . Hypercholesteremia   . Hypertension   . Hypothyroidism   . Lumbago   . Myalgia and myositis, unspecified   . Other abnormal blood chemistry   . Other atopic dermatitis and related conditions   . Thyroid disease   . Unspecified schizophrenia, unspecified condition     Patient Active Problem List   Diagnosis Date Noted  . Cholecystitis 07/09/2015  . Essential hypertension 05/03/2015  . 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 Caregiver stress 11/30/2014  . Neuropathic pain 07/13/2013  . Chronic pain 07/13/2013  . Tobacco use 07/13/2013  . Overweight 07/13/2013  . Tingling in extremities 04/19/2013  . Right shoulder pain 04/12/2013  . Localized swelling, mass and lump, neck 02/01/2013  . Hypercholesteremia    . Depression   . Anxiety   . Hypothyroidism   . Controlled type 2 diabetes mellitus with diabetic polyneuropathy [facility])     Past Surgical History:  Procedure Laterality Date  . ABDOMINAL HYSTERECTOMY    . APPENDECTOMY    . CHOLECYSTECTOMY N/A 07/11/2015   Procedure: LAPAROSCOPIC CHOLECYSTECTOMY WITH INTRAOPERATIVE CHOLANGIOGRAM;  Surgeon: Stark Klein, MD;  Location: [facility];  Service: General;  Laterality: N/A;  . EYE SURGERY Right    september  . THYROIDECTOMY       OB History   No obstetric history on file.      Home Medications    Prior to Admission medications   Medication Sig Start Date End Date Taking? Authorizing Provider  ALPRAZolam Duanne Moron) 1 MG tablet take 1 tablet by mouth three times a day if needed 04/14/16   Estill Dooms, MD  aspirin 81 MG chewable tablet Chew 81 mg by mouth every 6 (six) hours as needed for moderate pain.    [provider]  clobetasol cream (TEMOVATE) [id number] % Apply 1 application topically 2 (two) times daily. 04/14/16   Estill Dooms, MD  HYDROcodone-acetaminophen (NORCO/VICODIN) 5-325 MG tablet Take 1-2 tablets by mouth every 6 (six) hours as needed for moderate pain. 12/06/18 12/06/19  Fransico Meadow, PA-C  hydrocortisone 2.5 % cream Apply topically 2 (two) times daily. 12/19/14  Reed, Tiffany L, DO  levothyroxine (SYNTHROID, LEVOTHROID) 150 MCG tablet Take 1 tablet (150 mcg total) by mouth every morning. 04/14/16   Estill Dooms, MD  lisinopril (PRINIVIL,ZESTRIL) 5 MG tablet Take 1 tablet (5 mg total) by mouth daily. 04/14/16   Estill Dooms, MD  QUEtiapine (SEROQUEL) 50 MG tablet Take 1 tablet (50 mg total) by mouth at bedtime. 04/14/16   Estill Dooms, MD  triamcinolone cream (KENALOG) 0.5 % Apply 1 application topically 2 (two) times daily as needed (for ezcema). 04/14/16   Estill Dooms, MD    Family History Family History  Problem Relation Age of Onset  . Heart disease Other     Social History Social History    Tobacco Use  . Smoking status: Current Every Day Smoker    Types: Cigarettes  Substance Use Topics  . Alcohol use: No  . Drug use: No     Allergies   Citalopram   Review of Systems Review of Systems  All other systems reviewed and are negative.    Physical Exam Updated Vital Signs BP (!) 176/98 (BP Location: Left Arm)   Pulse 77   Temp 98.3 F (36.8 C) (Oral)   Resp 18   SpO2 97%   Physical Exam Vitals signs and nursing note reviewed.  Constitutional:      Appearance: She is well-developed.  HENT:     Head: Normocephalic.  Neck:     Musculoskeletal: Normal range of motion.  Cardiovascular:     Rate and Rhythm: Normal rate.  Pulmonary:     Effort: Pulmonary effort is normal.  Abdominal:     General: There is no distension.  Musculoskeletal:        General: Swelling, tenderness and deformity present.  Skin:    General: Skin is warm.  Neurological:     General: No focal deficit present.     Mental Status: She is alert and oriented to person, place, and time.  Psychiatric:        Mood and Affect: Mood normal.      ED Treatments / Results  Labs (all labs ordered are listed, but only abnormal results are displayed) Labs Reviewed - No data to display  EKG None  Radiology Dg Ankle 2 Views Left  Result Date: 12/06/2018 CLINICAL DATA:  Left ankle pain secondary to a fall this morning. EXAM: LEFT ANKLE - 2 VIEW COMPARISON:  None. FINDINGS: There is a trimalleolar fracture. Slight impaction minimal distraction of the posterior malleolar fragment. Slight distraction of the medial malleolar fragment. Minimal distraction of fracture of the distal fibula. Lateral soft tissue swelling. Ankle effusion. Abnormal widening of the anterior and medial aspects of the ankle joint. IMPRESSION: Trimalleolar fracture of the left ankle as described. Electronically Signed   By: Lorriane Shire M.D.   On: 12/06/2018 13:07    Procedures Procedures (including critical care time)   Medications Ordered in ED Medications  HYDROcodone-acetaminophen (NORCO/VICODIN) 5-325 MG per tablet 2 tablet (2 tablets Oral Given 12/06/18 1505)     Initial Impression / Assessment and Plan / ED Course  I have reviewed the triage vital signs and the nursing notes.  Pertinent labs & imaging results that were available during my care of the patient were reviewed by me and considered in my medical decision making (see chart for details).        MDM  xrays reviewed and discussed with pt. Pt placed in a posterior splint and crutches Pt advised of  need to see Orthopaedist for evaltuion.  Care management in to see and arranged for medical goods.  Home health  Final Clinical Impressions(s) / ED Diagnoses   Final diagnoses:  Closed trimalleolar fracture of left ankle, initial encounter    ED Discharge Orders         Ordered    HYDROcodone-acetaminophen (NORCO/VICODIN) 5-325 MG tablet  Every 6 hours PRN,   Status:  Discontinued     12/06/18 1504    HYDROcodone-acetaminophen (NORCO/VICODIN) 5-325 MG tablet  Every 6 hours PRN     12/06/18 1505        An After Visit Summary was printed and given to the patient.    Fransico Meadow, PA-C 12/06/18 1521    Dorie Rank, MD 12/07/18 [phone]

## 2018-12-06 NOTE — ED Triage Notes (Signed)
Per EMS pt coming down the stairs, thought she was at the end, fell down 2-3 steps. Complaint of left ankle pain. No LOC.  Pt is blind.

## 2018-12-08 ENCOUNTER — Telehealth: Payer: Self-pay | Admitting: *Deleted

## 2018-12-08 NOTE — Telephone Encounter (Signed)
TOC CM contacted pt and received information for Worker's Comp CM, Peggye Fothergill # [phone], fax [phone] or [phone]. Faxed progress notes, [place] orders and DME orders. Pt states she took her Rx for DME to [employer] store and they are checking her insurance. Made pt aware that Worker's Comp would cover her DME and to follow up with CM. [place], [place] ED TOC CM [phone]

## 2018-12-09 ENCOUNTER — Telehealth: Payer: Self-pay | Admitting: *Deleted

## 2018-12-09 NOTE — Telephone Encounter (Signed)
TOC CM spoke to pt and states her niece dropped Rx off at [employer], but has not heard for [facility] if insurance covers her DME. States she contact her CM, Samuella Bruin with [employer] and has not received clear answer how they will cover her DME. Attempted call to Samuella Bruin and left message. [place] and he will follow up on DME. [place], [place] ED TOC CM [phone]

## 2018-12-17 NOTE — Pre-Procedure Instructions (Addendum)
Toni Moses  12/17/2018      [facility] RE:[id number] [place] [address]  [address] [place] [address] Phone: [phone] Fax: [phone]    Your procedure is scheduled on Wednesday, December 22, 2018.  Report to [facility] Admitting at 1215 PM.  Call this number if you have problems the morning of surgery:  [phone]   Remember:  Do not eat or drink after midnight.     Take these medicines the morning of surgery with A SIP OF WATER: Amlodipine (Norvasc) Levothyroxine (Synthroid) Alprazolam (Xanax) if needed Hydrocodone-Acetaminophen (Norco/Vicodin) if needed    7 days prior to surgery STOP taking any Aspirin (unless otherwise instructed by your surgeon), Aleve, Naproxen, Ibuprofen, Motrin, Advil, Goody's, BC's, all herbal medications, fish oil, and all vitamins.   Do not wear jewelry, make-up or nail polish.  Do not wear lotions, powders, or perfumes, or deodorant.  Do not shave 48 hours prior to surgery.  Do not bring valuables to the hospital.  [facility] is not responsible for any belongings or valuables.  Contacts, dentures or bridgework may not be worn into surgery.  Leave your suitcase in the car.  After surgery it may be brought to your room.  For patients admitted to the hospital, discharge time will be determined by your treatment team.  Patients discharged the day of surgery will not be allowed to drive home.   How to Manage Your Diabetes Before and After Surgery  Why is it important to control my blood sugar before and after surgery? . Improving blood sugar levels before and after surgery helps healing and can limit problems. . A way of improving blood sugar control is eating a healthy diet by: o  Eating less sugar and carbohydrates o  Increasing activity/exercise o  Talking with your doctor about reaching your blood sugar goals . High blood sugars  (greater than 180 mg/dL) can raise your risk of infections and slow your recovery, so you will need to focus on controlling your diabetes during the weeks before surgery. . Make sure that the doctor who takes care of your diabetes knows about your planned surgery including the date and location.  How do I manage my blood sugar before surgery? . Check your blood sugar at least 4 times a day, starting 2 days before surgery, to make sure that the level is not too high or low. o Check your blood sugar the morning of your surgery when you wake up and every 2 hours until you get to the Short Stay unit. . If your blood sugar is less than 70 mg/dL, you will need to treat for low blood sugar: o Do not take insulin. o Treat a low blood sugar (less than 70 mg/dL) with  cup of clear juice (cranberry or apple), 4 glucose tablets, OR glucose gel. Recheck blood sugar in 15 minutes after treatment (to make sure it is greater than 70 mg/dL). If your blood sugar is not greater than 70 mg/dL on recheck, call [phone] o  for further instructions. . Report your blood sugar to the short stay nurse when you get to Short Stay.  . If you are admitted to the hospital after surgery: o Your blood sugar will be checked by the staff and you will probably be given insulin after surgery (instead of oral diabetes medicines) to make sure you have good blood sugar levels. o The goal  for blood sugar control after surgery is 80-180 mg/dL.  Do not take oral diabetes medicines (pills) the morning of surgery.   [facility]- Preparing For Surgery  Before surgery, you can play an important role. Because skin is not sterile, your skin needs to be as free of germs as possible. You can reduce the number of germs on your skin by washing with CHG (chlorahexidine gluconate) Soap before surgery.  CHG is an antiseptic cleaner which kills germs and bonds with the skin to continue killing germs even after washing.    Oral Hygiene is also  important to reduce your risk of infection.  Remember - BRUSH YOUR TEETH THE MORNING OF SURGERY WITH YOUR REGULAR TOOTHPASTE  Please do not use if you have an allergy to CHG or antibacterial soaps. If your skin becomes reddened/irritated stop using the CHG.  Do not shave (including legs and underarms) for at least 48 hours prior to first CHG shower. It is OK to shave your face.  Please follow these instructions carefully.   1. Shower the NIGHT BEFORE SURGERY and the MORNING OF SURGERY with CHG.   2. If you chose to wash your hair, wash your hair first as usual with your normal shampoo.  3. After you shampoo, rinse your hair and body thoroughly to remove the shampoo.  4. Use CHG as you would any other liquid soap. You can apply CHG directly to the skin and wash gently with a scrungie or a clean washcloth.   5. Apply the CHG Soap to your body ONLY FROM THE NECK DOWN.  Do not use on open wounds or open sores. Avoid contact with your eyes, ears, mouth and genitals (private parts). Wash Face and genitals (private parts)  with your normal soap.  6. Wash thoroughly, paying special attention to the area where your surgery will be performed.  7. Thoroughly rinse your body with warm water from the neck down.  8. DO NOT shower/wash with your normal soap after using and rinsing off the CHG Soap.  9. Pat yourself dry with a CLEAN TOWEL.  10. Wear CLEAN PAJAMAS to bed the night before surgery, wear comfortable clothes the morning of surgery  11. Place CLEAN SHEETS on your bed the night of your first shower and DO NOT SLEEP WITH PETS.  Day of Surgery:  Do not apply any deodorants/lotions.  Please wear clean clothes to the hospital/surgery center.   Remember to brush your teeth WITH YOUR REGULAR TOOTHPASTE.

## 2018-12-20 ENCOUNTER — Other Ambulatory Visit: Payer: Self-pay

## 2018-12-20 ENCOUNTER — Other Ambulatory Visit (HOSPITAL_COMMUNITY)
Admission: RE | Admit: 2018-12-20 | Discharge: 2018-12-20 | Disposition: A | Discharge status: Home / Self Care | Payer: Worker's Compensation | Source: Ambulatory Visit | Attending: Orthopedic Surgery | Admitting: Orthopedic Surgery

## 2018-12-20 ENCOUNTER — Encounter (HOSPITAL_COMMUNITY)
Admission: RE | Admit: 2018-12-20 | Discharge: 2018-12-20 | Disposition: A | Discharge status: Home / Self Care | Payer: Worker's Compensation | Source: Ambulatory Visit | Attending: Orthopedic Surgery | Admitting: Orthopedic Surgery

## 2018-12-20 ENCOUNTER — Encounter (HOSPITAL_COMMUNITY): Payer: Self-pay

## 2018-12-20 DIAGNOSIS — Z01818 Encounter for other preprocedural examination: Secondary | ICD-10-CM | POA: Diagnosis present

## 2018-12-20 DIAGNOSIS — Z20828 Contact with and (suspected) exposure to other viral communicable diseases: Secondary | ICD-10-CM | POA: Insufficient documentation

## 2018-12-20 HISTORY — DX: Personal history of urinary calculi: Z87.442

## 2018-12-20 LAB — BASIC METABOLIC PANEL
Anion gap: 8 (ref 5–15)
BUN: 18 mg/dL (ref 6–20)
CO2: 22 mmol/L (ref 22–32)
Calcium: 9.8 mg/dL (ref 8.9–10.3)
Chloride: 109 mmol/L (ref 98–111)
Creatinine, Ser: 1.46 mg/dL — ABNORMAL HIGH (ref 0.44–1.00)
GFR calc Af Amer: 46 mL/min — ABNORMAL LOW (ref >60)
GFR calc non Af Amer: 39 mL/min — ABNORMAL LOW (ref >60)
Glucose, Bld: 89 mg/dL (ref 70–99)
Potassium: 4.2 mmol/L (ref 3.5–5.1)
Sodium: 139 mmol/L (ref 135–145)

## 2018-12-20 LAB — HEMOGLOBIN A1C
Hgb A1c MFr Bld: 6.1 % — ABNORMAL HIGH (ref 4.8–5.6)
Mean Plasma Glucose: 128.37 mg/dL

## 2018-12-20 LAB — SURGICAL PCR SCREEN
MRSA, PCR: NEGATIVE
Staphylococcus aureus: POSITIVE — AB

## 2018-12-20 LAB — CBC
HCT: 33.8 % — ABNORMAL LOW (ref 36.0–46.0)
Hemoglobin: 11.1 g/dL — ABNORMAL LOW (ref 12.0–15.0)
MCH: 29.4 pg (ref 26.0–34.0)
MCHC: 32.8 g/dL (ref 30.0–36.0)
MCV: 89.7 fL (ref 80.0–100.0)
Platelets: 380 10*3/uL (ref 150–400)
RBC: 3.77 MIL/uL — ABNORMAL LOW (ref 3.87–5.11)
RDW: 15.7 % — ABNORMAL HIGH (ref 11.5–15.5)
WBC: 4.7 10*3/uL (ref 4.0–10.5)
nRBC: 0 % (ref 0.0–0.2)

## 2018-12-20 LAB — SARS CORONAVIRUS 2 (TAT 6-24 HRS): SARS Coronavirus 2: NEGATIVE

## 2018-12-20 LAB — GLUCOSE, CAPILLARY: Glucose-Capillary: 83 mg/dL (ref 70–99)

## 2018-12-20 NOTE — Progress Notes (Signed)
PCP - Dr. Antony Blackbird  Cardiologist - Denies  Chest x-ray -  Denies  EKG - 12/20/2018  Stress Test -  Denies  ECHO -  Denies  Cardiac Cath -  Denies  AICD-na PM-na LOOP-na  Sleep Study -  Denies CPAP -  Denies  LABS- 12/20/2018: CBC, BMP, PCR, COVID  ASA- Denies  ERAS- No  HA1C- 12/20/2018 Fasting Blood Sugar - today, 83 Checks Blood Sugar __0___ times a day  Anesthesia- No  Pt denies having chest pain, sob, or fever at this time. All instructions explained to the pt, with a verbal understanding of the material. Pt agrees to go over the instructions while at home for a better understanding. Pt also instructed to self quarantine after being tested for COVID-19. The opportunity to ask questions was provided.   Coronavirus Screening  Have you experienced the following symptoms:  Cough yes/no: No Fever (>100.70F)  yes/no: No Runny nose yes/no: No Sore throat yes/no: No Difficulty breathing/shortness of breath  yes/no: No  Have you or a family member traveled in the last 14 days and where? yes/no: No   If the patient indicates "YES" to the above questions, their PAT will be rescheduled to limit the exposure to others and, the surgeon will be notified. THE PATIENT WILL NEED TO BE ASYMPTOMATIC FOR 14 DAYS.   If the patient is not experiencing any of these symptoms, the PAT nurse will instruct them to NOT bring anyone with them to their appointment since they may have these symptoms or traveled as well.   Please remind your patients and families that hospital visitation restrictions are in effect and the importance of the restrictions.

## 2018-12-22 ENCOUNTER — Ambulatory Visit (HOSPITAL_COMMUNITY): Payer: Worker's Compensation | Admitting: Certified Registered Nurse Anesthetist

## 2018-12-22 ENCOUNTER — Encounter (HOSPITAL_COMMUNITY)
Admission: RE | Disposition: A | Discharge status: Home Health | Payer: Self-pay | Source: Ambulatory Visit | Attending: Orthopedic Surgery

## 2018-12-22 ENCOUNTER — Observation Stay (HOSPITAL_COMMUNITY)
Admission: RE | Admit: 2018-12-22 | Discharge: 2018-12-23 | Disposition: A | Discharge status: Home Health | Payer: Worker's Compensation | Source: Ambulatory Visit | Attending: Orthopedic Surgery | Admitting: Orthopedic Surgery

## 2018-12-22 ENCOUNTER — Encounter (HOSPITAL_COMMUNITY): Payer: Self-pay

## 2018-12-22 ENCOUNTER — Ambulatory Visit (HOSPITAL_COMMUNITY): Payer: Worker's Compensation

## 2018-12-22 ENCOUNTER — Other Ambulatory Visit: Payer: Self-pay

## 2018-12-22 DIAGNOSIS — Z6830 Body mass index (BMI) 30.0-30.9, adult: Secondary | ICD-10-CM | POA: Diagnosis not present

## 2018-12-22 DIAGNOSIS — Z419 Encounter for procedure for purposes other than remedying health state, unspecified: Secondary | ICD-10-CM

## 2018-12-22 DIAGNOSIS — F209 Schizophrenia, unspecified: Secondary | ICD-10-CM | POA: Insufficient documentation

## 2018-12-22 DIAGNOSIS — I10 Essential (primary) hypertension: Secondary | ICD-10-CM | POA: Diagnosis not present

## 2018-12-22 DIAGNOSIS — Z79890 Hormone replacement therapy (postmenopausal): Secondary | ICD-10-CM | POA: Diagnosis not present

## 2018-12-22 DIAGNOSIS — F419 Anxiety disorder, unspecified: Secondary | ICD-10-CM | POA: Diagnosis not present

## 2018-12-22 DIAGNOSIS — S82852A Displaced trimalleolar fracture of left lower leg, initial encounter for closed fracture: Principal | ICD-10-CM | POA: Diagnosis present

## 2018-12-22 DIAGNOSIS — Y990 Civilian activity done for income or pay: Secondary | ICD-10-CM | POA: Insufficient documentation

## 2018-12-22 DIAGNOSIS — Z79899 Other long term (current) drug therapy: Secondary | ICD-10-CM | POA: Diagnosis not present

## 2018-12-22 DIAGNOSIS — W19XXXA Unspecified fall, initial encounter: Secondary | ICD-10-CM | POA: Insufficient documentation

## 2018-12-22 DIAGNOSIS — E119 Type 2 diabetes mellitus without complications: Secondary | ICD-10-CM | POA: Insufficient documentation

## 2018-12-22 DIAGNOSIS — F319 Bipolar disorder, unspecified: Secondary | ICD-10-CM | POA: Insufficient documentation

## 2018-12-22 DIAGNOSIS — E669 Obesity, unspecified: Secondary | ICD-10-CM | POA: Insufficient documentation

## 2018-12-22 DIAGNOSIS — F17210 Nicotine dependence, cigarettes, uncomplicated: Secondary | ICD-10-CM | POA: Diagnosis not present

## 2018-12-22 DIAGNOSIS — E890 Postprocedural hypothyroidism: Secondary | ICD-10-CM | POA: Insufficient documentation

## 2018-12-22 HISTORY — PX: ORIF ANKLE FRACTURE: SHX5408

## 2018-12-22 LAB — CREATININE, SERUM
Creatinine, Ser: 1.39 mg/dL — ABNORMAL HIGH (ref 0.44–1.00)
GFR calc Af Amer: 48 mL/min — ABNORMAL LOW (ref >60)
GFR calc non Af Amer: 42 mL/min — ABNORMAL LOW (ref >60)

## 2018-12-22 LAB — GLUCOSE, CAPILLARY
Glucose-Capillary: 100 mg/dL — ABNORMAL HIGH (ref 70–99)
Glucose-Capillary: 108 mg/dL — ABNORMAL HIGH (ref 70–99)

## 2018-12-22 LAB — CBC
HCT: 34.5 % — ABNORMAL LOW (ref 36.0–46.0)
Hemoglobin: 10.8 g/dL — ABNORMAL LOW (ref 12.0–15.0)
MCH: 28.6 pg (ref 26.0–34.0)
MCHC: 31.3 g/dL (ref 30.0–36.0)
MCV: 91.5 fL (ref 80.0–100.0)
Platelets: 361 10*3/uL (ref 150–400)
RBC: 3.77 MIL/uL — ABNORMAL LOW (ref 3.87–5.11)
RDW: 15.7 % — ABNORMAL HIGH (ref 11.5–15.5)
WBC: 6.2 10*3/uL (ref 4.0–10.5)
nRBC: 0 % (ref 0.0–0.2)

## 2018-12-22 LAB — HIV ANTIBODY (ROUTINE TESTING W REFLEX): HIV Screen 4th Generation wRfx: NONREACTIVE

## 2018-12-22 SURGERY — OPEN REDUCTION INTERNAL FIXATION (ORIF) ANKLE FRACTURE
Anesthesia: Monitor Anesthesia Care | Site: Ankle | Laterality: Left

## 2018-12-22 MED ORDER — HYDROCODONE-ACETAMINOPHEN 5-325 MG PO TABS
ORAL_TABLET | ORAL | Status: AC
  Filled 2018-12-22: qty 2

## 2018-12-22 MED ORDER — MIDAZOLAM HCL 2 MG/2ML IJ SOLN
INTRAMUSCULAR | Status: AC
  Filled 2018-12-22: qty 2

## 2018-12-22 MED ORDER — LACTATED RINGERS IV SOLN
INTRAVENOUS | Status: DC
  Administered 2018-12-22: 13:00:00 via INTRAVENOUS

## 2018-12-22 MED ORDER — OXYCODONE HCL 5 MG/5ML PO SOLN: 5.0000 mg | Freq: Once | ORAL | Status: DC | PRN

## 2018-12-22 MED ORDER — MIDAZOLAM HCL 2 MG/2ML IJ SOLN
2.0000 mg | Freq: Once | INTRAMUSCULAR | Status: AC
  Administered 2018-12-22: 14:00:00 2 mg via INTRAVENOUS

## 2018-12-22 MED ORDER — ACETAMINOPHEN 325 MG PO TABS: 325.0000 mg | ORAL_TABLET | Freq: Four times a day (QID) | ORAL | Status: DC | PRN

## 2018-12-22 MED ORDER — LIDOCAINE 2% (20 MG/ML) 5 ML SYRINGE
INTRAMUSCULAR | Status: AC
  Filled 2018-12-22: qty 5

## 2018-12-22 MED ORDER — PROPOFOL 500 MG/50ML IV EMUL
INTRAVENOUS | Status: DC | PRN
  Administered 2018-12-22: 100 ug/kg/min via INTRAVENOUS

## 2018-12-22 MED ORDER — HYDROCODONE-ACETAMINOPHEN 7.5-325 MG PO TABS
1.0000 | ORAL_TABLET | ORAL | Status: DC | PRN
  Administered 2018-12-23: 2 via ORAL
  Filled 2018-12-22: qty 2

## 2018-12-22 MED ORDER — FENTANYL CITRATE (PF) 100 MCG/2ML IJ SOLN
INTRAMUSCULAR | Status: DC | PRN
  Administered 2018-12-22 (×3): 25 ug via INTRAVENOUS

## 2018-12-22 MED ORDER — ONDANSETRON HCL 4 MG/2ML IJ SOLN
INTRAMUSCULAR | Status: DC | PRN
  Administered 2018-12-22: 4 mg via INTRAVENOUS

## 2018-12-22 MED ORDER — OXYCODONE HCL 5 MG PO TABS: 5.0000 mg | ORAL_TABLET | Freq: Once | ORAL | Status: DC | PRN

## 2018-12-22 MED ORDER — METHOCARBAMOL 500 MG PO TABS: 500.0000 mg | ORAL_TABLET | Freq: Four times a day (QID) | ORAL | Status: DC | PRN

## 2018-12-22 MED ORDER — AMLODIPINE BESYLATE 2.5 MG PO TABS
2.5000 mg | ORAL_TABLET | Freq: Every day | ORAL | Status: DC
  Administered 2018-12-23: 10:00:00 2.5 mg via ORAL
  Filled 2018-12-22: qty 1

## 2018-12-22 MED ORDER — ONDANSETRON HCL 4 MG/2ML IJ SOLN: 4.0000 mg | Freq: Four times a day (QID) | INTRAMUSCULAR | Status: DC | PRN

## 2018-12-22 MED ORDER — DEXAMETHASONE SODIUM PHOSPHATE 10 MG/ML IJ SOLN
INTRAMUSCULAR | Status: AC
  Filled 2018-12-22: qty 1

## 2018-12-22 MED ORDER — STERILE WATER FOR IRRIGATION IR SOLN
Status: DC | PRN
  Administered 2018-12-22: 1000 mL

## 2018-12-22 MED ORDER — FENTANYL CITRATE (PF) 100 MCG/2ML IJ SOLN
50.0000 ug | Freq: Once | INTRAMUSCULAR | Status: AC
  Administered 2018-12-22: 14:00:00 50 ug via INTRAVENOUS

## 2018-12-22 MED ORDER — TRIAMCINOLONE ACETONIDE 0.5 % EX CREA
1.0000 "application " | TOPICAL_CREAM | Freq: Two times a day (BID) | CUTANEOUS | Status: DC | PRN
  Filled 2018-12-22: qty 15

## 2018-12-22 MED ORDER — FENTANYL CITRATE (PF) 100 MCG/2ML IJ SOLN
INTRAMUSCULAR | Status: AC
  Administered 2018-12-22: 14:00:00 50 ug via INTRAVENOUS
  Filled 2018-12-22: qty 2

## 2018-12-22 MED ORDER — ENOXAPARIN SODIUM 40 MG/0.4ML ~~LOC~~ SOLN
40.0000 mg | SUBCUTANEOUS | Status: DC
  Administered 2018-12-23: 06:00:00 40 mg via SUBCUTANEOUS
  Filled 2018-12-22: qty 0.4

## 2018-12-22 MED ORDER — METHOCARBAMOL 1000 MG/10ML IJ SOLN
500.0000 mg | Freq: Four times a day (QID) | INTRAVENOUS | Status: DC | PRN
  Filled 2018-12-22: qty 5

## 2018-12-22 MED ORDER — CHLORHEXIDINE GLUCONATE 4 % EX LIQD: 60.0000 mL | Freq: Once | CUTANEOUS | Status: DC

## 2018-12-22 MED ORDER — ONDANSETRON HCL 4 MG PO TABS: 4.0000 mg | ORAL_TABLET | Freq: Four times a day (QID) | ORAL | Status: DC | PRN

## 2018-12-22 MED ORDER — SODIUM CHLORIDE (PF) 0.9 % IJ SOLN
INTRAMUSCULAR | Status: AC
  Filled 2018-12-22: qty 10

## 2018-12-22 MED ORDER — LEVOTHYROXINE SODIUM 75 MCG PO TABS
175.0000 ug | ORAL_TABLET | Freq: Every day | ORAL | Status: DC
  Administered 2018-12-23: 175 ug via ORAL
  Filled 2018-12-22: qty 1

## 2018-12-22 MED ORDER — MORPHINE SULFATE (PF) 2 MG/ML IV SOLN: 0.5000 mg | INTRAVENOUS | Status: DC | PRN

## 2018-12-22 MED ORDER — CEFAZOLIN SODIUM 1 G IJ SOLR
INTRAMUSCULAR | Status: AC
  Filled 2018-12-22: qty 10

## 2018-12-22 MED ORDER — ROPIVACAINE HCL 5 MG/ML IJ SOLN
INTRAMUSCULAR | Status: DC | PRN
  Administered 2018-12-22: 40 mL via PERINEURAL

## 2018-12-22 MED ORDER — MEPERIDINE HCL 25 MG/ML IJ SOLN: 6.2500 mg | INTRAMUSCULAR | Status: DC | PRN

## 2018-12-22 MED ORDER — MIDAZOLAM HCL 2 MG/2ML IJ SOLN
INTRAMUSCULAR | Status: AC
  Administered 2018-12-22: 14:00:00 2 mg via INTRAVENOUS
  Filled 2018-12-22: qty 2

## 2018-12-22 MED ORDER — HYDROMORPHONE HCL 1 MG/ML IJ SOLN
0.2500 mg | INTRAMUSCULAR | Status: DC | PRN
  Administered 2018-12-22 (×2): 0.5 mg via INTRAVENOUS

## 2018-12-22 MED ORDER — PROPOFOL 10 MG/ML IV BOLUS
INTRAVENOUS | Status: AC
  Filled 2018-12-22: qty 40

## 2018-12-22 MED ORDER — ACETAMINOPHEN 500 MG PO TABS
500.0000 mg | ORAL_TABLET | Freq: Four times a day (QID) | ORAL | Status: AC
  Administered 2018-12-22 – 2018-12-23 (×3): 500 mg via ORAL
  Filled 2018-12-22 (×3): qty 1

## 2018-12-22 MED ORDER — FENTANYL CITRATE (PF) 250 MCG/5ML IJ SOLN
INTRAMUSCULAR | Status: AC
  Filled 2018-12-22: qty 5

## 2018-12-22 MED ORDER — FLUOXETINE HCL 20 MG PO CAPS
20.0000 mg | ORAL_CAPSULE | Freq: Every day | ORAL | Status: DC
  Administered 2018-12-22: 22:00:00 20 mg via ORAL
  Filled 2018-12-22: qty 1

## 2018-12-22 MED ORDER — NAPROXEN 250 MG PO TABS
250.0000 mg | ORAL_TABLET | Freq: Two times a day (BID) | ORAL | Status: DC
  Administered 2018-12-23 (×2): 250 mg via ORAL
  Filled 2018-12-22 (×4): qty 1

## 2018-12-22 MED ORDER — ROCURONIUM BROMIDE 10 MG/ML (PF) SYRINGE
PREFILLED_SYRINGE | INTRAVENOUS | Status: AC
  Filled 2018-12-22: qty 20

## 2018-12-22 MED ORDER — ALPRAZOLAM 0.5 MG PO TABS: 1.0000 mg | ORAL_TABLET | Freq: Two times a day (BID) | ORAL | Status: DC | PRN

## 2018-12-22 MED ORDER — MIDAZOLAM HCL 5 MG/5ML IJ SOLN
INTRAMUSCULAR | Status: DC | PRN
  Administered 2018-12-22 (×2): 1 mg via INTRAVENOUS

## 2018-12-22 MED ORDER — CEFAZOLIN SODIUM-DEXTROSE 2-4 GM/100ML-% IV SOLN
2.0000 g | INTRAVENOUS | Status: AC
  Administered 2018-12-22: 2 g via INTRAVENOUS
  Filled 2018-12-22: qty 100

## 2018-12-22 MED ORDER — 0.9 % SODIUM CHLORIDE (POUR BTL) OPTIME
TOPICAL | Status: DC | PRN
  Administered 2018-12-22: 1000 mL

## 2018-12-22 MED ORDER — HYDROMORPHONE HCL 1 MG/ML IJ SOLN
INTRAMUSCULAR | Status: AC
  Filled 2018-12-22: qty 1

## 2018-12-22 MED ORDER — PROMETHAZINE HCL 25 MG/ML IJ SOLN: 6.2500 mg | INTRAMUSCULAR | Status: DC | PRN

## 2018-12-22 MED ORDER — HYDROCODONE-ACETAMINOPHEN 5-325 MG PO TABS
1.0000 | ORAL_TABLET | ORAL | Status: DC | PRN
  Administered 2018-12-22: 22:00:00 1 via ORAL
  Administered 2018-12-22 – 2018-12-23 (×2): 2 via ORAL
  Filled 2018-12-22 (×2): qty 2

## 2018-12-22 MED ORDER — EPINEPHRINE 1 MG/10ML IJ SOSY
PREFILLED_SYRINGE | INTRAMUSCULAR | Status: AC
  Filled 2018-12-22: qty 10

## 2018-12-22 MED ORDER — BUPIVACAINE HCL (PF) 0.25 % IJ SOLN
INTRAMUSCULAR | Status: AC
  Filled 2018-12-22: qty 30

## 2018-12-22 MED ORDER — PROPOFOL 10 MG/ML IV BOLUS
INTRAVENOUS | Status: DC | PRN
  Administered 2018-12-22: 20 mg via INTRAVENOUS

## 2018-12-22 MED ORDER — SUCCINYLCHOLINE CHLORIDE 200 MG/10ML IV SOSY
PREFILLED_SYRINGE | INTRAVENOUS | Status: AC
  Filled 2018-12-22: qty 10

## 2018-12-22 MED ORDER — DEXAMETHASONE SODIUM PHOSPHATE 10 MG/ML IJ SOLN
INTRAMUSCULAR | Status: DC | PRN
  Administered 2018-12-22: 10 mg

## 2018-12-22 SURGICAL SUPPLY — 53 items
ALCOHOL 70% 16 OZ (MISCELLANEOUS) ×2 IMPLANT
BANDAGE ESMARK 6X9 LF (GAUZE/BANDAGES/DRESSINGS) IMPLANT
BIT DRILL 2.5X110 QC LCP DISP (BIT) ×1 IMPLANT
BIT DRILL QC 3.5X110 (BIT) ×1 IMPLANT
BNDG COHESIVE 4X5 TAN STRL (GAUZE/BANDAGES/DRESSINGS) ×2 IMPLANT
BNDG ESMARK 6X9 LF (GAUZE/BANDAGES/DRESSINGS) ×2
CANISTER SUCT 3000ML PPV (MISCELLANEOUS) ×2 IMPLANT
COVER SURGICAL LIGHT HANDLE (MISCELLANEOUS) ×2 IMPLANT
CUFF TOURN SGL QUICK 34 (TOURNIQUET CUFF) ×1
CUFF TRNQT CYL 34X4.125X (TOURNIQUET CUFF) IMPLANT
DRAPE C-ARM 42X72 X-RAY (DRAPES) ×2 IMPLANT
DRAPE C-ARMOR (DRAPES) ×2 IMPLANT
DRAPE U-SHAPE 47X51 STRL (DRAPES) ×2 IMPLANT
DURAPREP 26ML APPLICATOR (WOUND CARE) ×2 IMPLANT
ELECT REM PT RETURN 9FT ADLT (ELECTROSURGICAL) ×2
ELECTRODE REM PT RTRN 9FT ADLT (ELECTROSURGICAL) ×1 IMPLANT
GLOVE BIO SURGEON STRL SZ7.5 (GLOVE) ×2 IMPLANT
GLOVE BIOGEL PI IND STRL 8 (GLOVE) ×1 IMPLANT
GLOVE BIOGEL PI INDICATOR 8 (GLOVE) ×1
GOWN STRL REUS W/ TWL LRG LVL3 (GOWN DISPOSABLE) ×2 IMPLANT
GOWN STRL REUS W/ TWL XL LVL3 (GOWN DISPOSABLE) ×1 IMPLANT
GOWN STRL REUS W/TWL LRG LVL3 (GOWN DISPOSABLE) ×3
GOWN STRL REUS W/TWL XL LVL3 (GOWN DISPOSABLE) ×1
GUIDEWARE NON THREAD 1.25X150 (WIRE) ×4
GUIDEWIRE NON THREAD 1.25X150 (WIRE) IMPLANT
KIT BASIN OR (CUSTOM PROCEDURE TRAY) ×2 IMPLANT
KIT TURNOVER KIT B (KITS) ×2 IMPLANT
NDL HYPO 25GX1X1/2 BEV (NEEDLE) IMPLANT
NEEDLE HYPO 25GX1X1/2 BEV (NEEDLE) ×2 IMPLANT
NS IRRIG 1000ML POUR BTL (IV SOLUTION) ×2 IMPLANT
PACK ORTHO EXTREMITY (CUSTOM PROCEDURE TRAY) ×2 IMPLANT
PAD ARMBOARD 7.5X6 YLW CONV (MISCELLANEOUS) ×4 IMPLANT
PLATE LCP 3.5 1/3 TUB 10HX117 (Plate) ×1 IMPLANT
SCREW CANC FT ST SFS 4X16 (Screw) ×1 IMPLANT
SCREW CANC FT/18 4.0 (Screw) ×2 IMPLANT
SCREW CANN S THRD/38 4.0 (Screw) ×2 IMPLANT
SCREW CORTEX 3.5 16MM (Screw) ×5 IMPLANT
SCREW CORTEX 3.5 18MM (Screw) ×1 IMPLANT
SCREW CORTEX 3.5 22MM (Screw) ×1 IMPLANT
SCREW LOCK CORT ST 3.5X16 (Screw) IMPLANT
SCREW LOCK CORT ST 3.5X18 (Screw) IMPLANT
SCREW LOCK CORT ST 3.5X22 (Screw) IMPLANT
SCREW SHORT THREAD 4.0X40 (Screw) ×2 IMPLANT
SPONGE LAP 18X18 RF (DISPOSABLE) ×1 IMPLANT
STAPLER VISISTAT 35W (STAPLE) ×1 IMPLANT
SUCTION FRAZIER HANDLE 10FR (MISCELLANEOUS) ×1
SUCTION TUBE FRAZIER 10FR DISP (MISCELLANEOUS) ×1 IMPLANT
SUT ETHILON 3 0 PS 1 (SUTURE) ×3 IMPLANT
SUT VIC AB 2-0 CT1 27 (SUTURE) ×2
SUT VIC AB 2-0 CT1 TAPERPNT 27 (SUTURE) ×1 IMPLANT
TOWEL GREEN STERILE (TOWEL DISPOSABLE) ×4 IMPLANT
TUBE CONNECTING 12X1/4 (SUCTIONS) ×2 IMPLANT
YANKAUER SUCT BULB TIP NO VENT (SUCTIONS) ×2 IMPLANT

## 2018-12-22 NOTE — Brief Op Note (Signed)
12/22/2018  3:57 PM  PATIENT:  Toni Moses  58 y.o. female  PRE-OPERATIVE DIAGNOSIS:  Left ankle trimalleolar fracture  POST-OPERATIVE DIAGNOSIS:  Left ankle trimalleolar fracture  PROCEDURE:  Procedure(s) with comments: OPEN REDUCTION INTERNAL FIXATION (ORIF) ANKLE FRACTURE (Left) - 2 hrs  SURGEON:  Surgeon(s) and Role:    * Stann Mainland, Elly Modena, MD - Primary  PHYSICIAN ASSISTANT:   ASSISTANTS: Katy Apo, RNFA   ANESTHESIA:   regional and IV sedation  EBL:  5 mL   BLOOD ADMINISTERED:none  DRAINS: none   LOCAL MEDICATIONS USED:  NONE  SPECIMEN:  No Specimen  DISPOSITION OF SPECIMEN:  N/A  COUNTS:  YES  TOURNIQUET:  * Missing tourniquet times found for documented tourniquets in log: [id number] *  DICTATION: .Note written in EPIC  PLAN OF CARE: Admit for overnight observation  PATIENT DISPOSITION:  PACU - hemodynamically stable.   Delay start of Pharmacological VTE agent (>24hrs) due to surgical blood loss or risk of bleeding: not applicable

## 2018-12-22 NOTE — Anesthesia Postprocedure Evaluation (Signed)
Anesthesia Post Note  Patient: Toni Moses  Procedure(s) Performed: OPEN REDUCTION INTERNAL FIXATION (ORIF) ANKLE FRACTURE (Left Ankle)     Patient location during evaluation: PACU Anesthesia Type: Regional and MAC Level of consciousness: awake and alert Pain management: pain level controlled Vital Signs Assessment: post-procedure vital signs reviewed and stable Respiratory status: spontaneous breathing, nonlabored ventilation and respiratory function stable Cardiovascular status: blood pressure returned to baseline and stable Postop Assessment: no apparent nausea or vomiting Anesthetic complications: no    Last Vitals:  Vitals:   12/22/18 1857 12/22/18 1903  BP: (!) 187/102 (!) 175/88  Pulse: (!) 57   Resp: 17   Temp: 36.6 C   SpO2: 98%     Last Pain:  Vitals:   12/22/18 1857  TempSrc: Oral  PainSc:                  Pervis Hocking

## 2018-12-22 NOTE — Transfer of Care (Signed)
Immediate Anesthesia Transfer of Care Note  Patient: Toni Moses  Procedure(s) Performed: OPEN REDUCTION INTERNAL FIXATION (ORIF) ANKLE FRACTURE (Left Ankle)  Patient Location: PACU  Anesthesia Type:MAC combined with regional for post-op pain  Level of Consciousness: awake, alert  and oriented  Airway & Oxygen Therapy: Patient Spontanous Breathing and Patient connected to nasal cannula oxygen  Post-op Assessment: Report given to RN and Post -op Vital signs reviewed and stable  Post vital signs: Reviewed and stable  Last Vitals:  Vitals Value Taken Time  BP 122/80 12/22/18 1552  Temp    Pulse 62 12/22/18 1556  Resp 21 12/22/18 1556  SpO2 92 % 12/22/18 1556  Vitals shown include unvalidated device data.  Last Pain:  Vitals:   12/22/18 1208  TempSrc:   PainSc: 0-No pain         Complications: No apparent anesthesia complications

## 2018-12-22 NOTE — Anesthesia Procedure Notes (Signed)
Anesthesia Regional Block: Popliteal block   Pre-Anesthetic Checklist: ,, timeout performed, Correct Patient, Correct Site, Correct Laterality, Correct Procedure, Correct Position, site marked, Risks and benefits discussed,  Surgical consent,  Pre-op evaluation,  At surgeon's request and post-op pain management  Laterality: Left  Prep: Maximum Sterile Barrier Precautions used, chloraprep       Needles:  Injection technique: Single-shot  Needle Type: Echogenic Stimulator Needle     Needle Length: 9cm  Needle Gauge: 22     Additional Needles:   Procedures:,,,, ultrasound used (permanent image in chart),,,,  Narrative:  Start time: 12/22/2018 1:40 PM End time: 12/22/2018 1:45 PM Injection made incrementally with aspirations every 5 mL.  Performed by: Personally  Anesthesiologist: Pervis Hocking, DO  Additional Notes: Monitors applied. No increased pain on injection. No increased resistance to injection. Injection made in 5cc increments. Good needle visualization. Patient tolerated procedure well.

## 2018-12-22 NOTE — Op Note (Signed)
Date of Surgery: 12/22/2018  INDICATIONS: Ms. Danker is a 58 y.o.-year-old female who sustained a left trimalleolar ankle fracture sustained at work; she was indicated for open reduction and internal fixation due to the displaced nature of the articular fracture and came to the operating room today for this procedure. The patient did consent to the procedure after discussion of the risks and benefits.  PREOPERATIVE DIAGNOSIS: left trimalleolar ankle fracture, closed  POSTOPERATIVE DIAGNOSIS: Same.  PROCEDURE:  1. Open treatment of left ankle fracture with internal fixation. Trimalleolar w/ fixation of posterior malleolus CPT [id number]. 2.  Application of short leg splint in the operating room.  SURGEON: Pattrick Bady P. Stann Mainland, M.D.  ASSIST: Katy Apo, RNFA.  ANESTHESIA: Regional with IV sedation  TOURNIQUET TIME: 75 minutes at 300 mmHg  IV FLUIDS AND URINE: See anesthesia.  ESTIMATED BLOOD LOSS: 5 mL.  IMPLANTS: Synthes small frag set with 10 hole one third tubular plate and 4.0 cancellous screws as well as 3.5 cortical screws. 4.0 cannulated screws for posterior malleolus 4.0 cannulated screws for medial malleolus  COMPLICATIONS: None.  Unique aspects to the case:  The fibula was quite comminuted and her general bone quality was quite poor.  There was a long split propagating from the standard distal oblique Weber B component of the fibula that propagated proximally in the coronal plane.  This forced [place] to slight our plate posterior to get good posterior to anterior bicortical screws without splitting the fracture.  Medially, likewise she had very poor bone quality as well as in the posterior malleolus.  DESCRIPTION OF PROCEDURE: The patient was brought to the operating room and placed supine on the operating table.  The patient had been signed prior to the procedure and this was documented. The patient had the anesthesia placed by the anesthesiologist.  A nonsterile tourniquet was  placed on the upper thigh.  The prep verification and incision time-outs were performed to confirm that this was the correct patient, site, side and location. The patient had an SCD on the opposite lower extremity. The patient did receive antibiotics prior to the incision and was re-dosed during the procedure as needed at indicated intervals.  The patient had the lower extremity prepped and draped in the standard surgical fashion.  The extremity was exsanguinated using gravity and the tourniquet was inflated to 300 mm Hg.   Incision was made over the distal fibula and the fracture was exposed and reduced anatomically with a clamp. A lag screw was placed. I then applied a 1/3 tubular locking plate, to the posterior aspect of the fibula due to orientation of the propagated fracture fragment in the coronal plane, and secured it proximally and distally with non-locking screws. Bone quality was quite poor. I used c-arm to confirm satisfactory reduction and fixation.   I then turned my attention to the medial malleolus. Incision was made over the medial malleolus and the fracture exposed and held provisionally with a clamp. 2 guidepins were placed for the 4.0 mm cannulated screws and then confirmation of reduction was made with fluoroscopy. I then placed 2  37mm screws which had satisfactory fixation.   Lastly, we fixed the posterior malleolus.  We placed 2 cannulated 4.0 millimeter screws into the greater than 25% articular posterior malleolus piece.  This was actually reduced anatomically via a posterior medial approach through the posterior fracture fragment that propagated medially.  This was easily accessed through the medial incision.  Care was taken to avoid neurovascular bundle both posterior medially  and anteriorly as we did perform a proper dissection down to bone in both planes.  The posterior malleolus was then clamped with a pointed tenaculum clamp.  Intraoperative fluoroscopy indicated adequate  reduction.  We then placed cannulated screws from anterior to posterior position with very good purchase in the distalmost screw and reasonable purchase and proximal.  The syndesmosis was stressed using live fluoroscopy and found to be stable.   The wounds were irrigated, and closed with vicryl with routine closure for the skin. The wounds were injected with local anesthetic. Sterile gauze was applied followed by a posterior splint. She was awakened and returned to the PACU in stable and satisfactory condition. There were no complications.  All counts were correct x2.  POSTOPERATIVE PLAN: Ms. Truxillo will remain nonweightbearing on this leg for approximately 8 weeks; Ms. Alles will return for suture removal in 2 weeks.  We will admit her for observation and formal physical therapy as she does live on the second floor alone.  We will likely discharge home tomorrow.  She will follow-up with me in 2 weeks for staple removal and transition to a cast for 2 weeks.     Geralynn Rile, MD [facility] [phone] 3:59 PM

## 2018-12-22 NOTE — Anesthesia Procedure Notes (Signed)
Anesthesia Regional Block: Adductor canal block   Pre-Anesthetic Checklist: ,, timeout performed, Correct Patient, Correct Site, Correct Laterality, Correct Procedure, Correct Position, site marked, Risks and benefits discussed,  Surgical consent,  Pre-op evaluation,  At surgeon's request and post-op pain management  Laterality: Left  Prep: Maximum Sterile Barrier Precautions used, chloraprep       Needles:  Injection technique: Single-shot  Needle Type: Echogenic Stimulator Needle     Needle Length: 9cm  Needle Gauge: 22     Additional Needles:   Procedures:,,,, ultrasound used (permanent image in chart),,,,  Narrative:  Injection made incrementally with aspirations every 5 mL.  Performed by: Personally  Anesthesiologist: Pervis Hocking, DO  Additional Notes: Monitors applied. No increased pain on injection. No increased resistance to injection. Injection made in 5cc increments. Good needle visualization. Patient tolerated procedure well.

## 2018-12-22 NOTE — Progress Notes (Signed)
Pt arrived to unit. Dinner ordered. Call bell within reach.

## 2018-12-22 NOTE — Anesthesia Procedure Notes (Signed)
Procedure Name: MAC Date/Time: 12/22/2018 2:01 PM Performed by: Inda Coke, CRNA Pre-anesthesia Checklist: Patient identified, Emergency Drugs available, Suction available, Timeout performed and Patient being monitored Patient Re-evaluated:Patient Re-evaluated prior to induction Oxygen Delivery Method: Simple face mask Induction Type: IV induction Dental Injury: Teeth and Oropharynx as per pre-operative assessment

## 2018-12-22 NOTE — H&P (Signed)
ORTHOPAEDIC H and P  REQUESTING PHYSICIAN: Nicholes Stairs, MD  PCP:  Default, Provider, MD  Chief Complaint: Left ankle fracture  HPI: Toni Moses is a 58 y.o. female who complains of left ankle pain following a fall during her normal work back on 16 November.  She sustained a trimalleolar ankle fracture.  This was temporized with a short leg splint.  She presents today for open reduction internal fixation of this unstable trimalleolar fracture.  No new complaints at this time.  Past Medical History:  Diagnosis Date  . Anxiety   . Benign neoplasm of lymph nodes   . Bipolar I disorder, most recent episode (or current) unspecified   . Depression   . Diabetes mellitus type 2, controlled ([place])    controlled with diet  . Enlargement of lymph nodes   . History of kidney stones   . Hypercholesteremia   . Hypertension   . Hypothyroidism   . Lumbago   . Myalgia and myositis, unspecified   . Other abnormal blood chemistry   . Other atopic dermatitis and related conditions   . Thyroid disease   . Unspecified schizophrenia, unspecified condition    Past Surgical History:  Procedure Laterality Date  . ABDOMINAL HYSTERECTOMY    . APPENDECTOMY    . CHOLECYSTECTOMY N/A 07/11/2015   Procedure: LAPAROSCOPIC CHOLECYSTECTOMY WITH INTRAOPERATIVE CHOLANGIOGRAM;  Surgeon: Stark Klein, MD;  Location: [facility];  Service: General;  Laterality: N/A;  . EYE SURGERY Right    september  . THYROIDECTOMY     Social History   Socioeconomic History  . Marital status: Widowed    Spouse name: Not on file  . Number of children: Not on file  . Years of education: Not on file  . Highest education level: Not on file  Occupational History  . Not on file  Social Needs  . Financial resource strain: Not on file  . Food insecurity    Worry: Not on file    Inability: Not on file  . Transportation needs    Medical: Not on file    Non-medical: Not on file  Tobacco Use  . Smoking status:  Current Every Day Smoker    Types: Cigarettes  . Smokeless tobacco: Never Used  Substance and Sexual Activity  . Alcohol use: Yes    Comment: occ  . Drug use: No  . Sexual activity: Not on file  Lifestyle  . Physical activity    Days per week: Not on file    Minutes per session: Not on file  . Stress: Not on file  Relationships  . Social Herbalist on phone: Not on file    Gets together: Not on file    Attends religious service: Not on file    Active member of club or organization: Not on file    Attends meetings of clubs or organizations: Not on file    Relationship status: Not on file  Other Topics Concern  . Not on file  Social History Narrative  . Not on file   Family History  Problem Relation Age of Onset  . Heart disease Other    Allergies  Allergen Reactions  . Citalopram Other (See Comments)    "made her feel bad", "foaming at the mouth"   Prior to Admission medications   Medication Sig Start Date End Date Taking? Authorizing Provider  amLODipine (NORVASC) 2.5 MG tablet Take 2.5 mg by mouth daily. 11/15/18  Yes [provider]  clobetasol cream (TEMOVATE) [id number] % Apply 1 application topically 2 (two) times daily. Patient taking differently: Apply 1 application topically 2 (two) times daily as needed (skin irritation.).  04/14/16  Yes Estill Dooms, MD  FLUoxetine (PROZAC) 20 MG capsule Take 20 mg by mouth at bedtime. 11/15/18  Yes [provider]  HYDROcodone-acetaminophen (NORCO/VICODIN) 5-325 MG tablet Take 1-2 tablets by mouth every 6 (six) hours as needed for moderate pain. 12/06/18 12/06/19 Yes Fransico Meadow, PA-C  hydrocortisone 2.5 % cream Apply topically 2 (two) times daily. Patient taking differently: Apply 1 application topically 2 (two) times daily as needed (skin irritation.).  12/19/14  Yes Reed, Tiffany L, DO  levothyroxine (SYNTHROID) 175 MCG tablet Take 175 mcg by mouth daily before breakfast. 11/20/18  Yes [provider]  triamcinolone cream (KENALOG) 0.5 % Apply 1 application topically 2 (two) times daily as needed (for ezcema). 04/14/16  Yes Estill Dooms, MD  ALPRAZolam Duanne Moron) 1 MG tablet take 1 tablet by mouth three times a day if needed Patient taking differently: Take 1 mg by mouth 2 (two) times daily as needed for anxiety.  04/14/16   Estill Dooms, MD   No results found.  Positive ROS: All other systems have been reviewed and were otherwise negative with the exception of those mentioned in the HPI and as above.  Physical Exam: General: Alert, no acute distress Cardiovascular: No pedal edema Respiratory: No cyanosis, no use of accessory musculature GI: No organomegaly, abdomen is soft and non-tender Skin: No lesions in the area of chief complaint Neurologic: Sensation intact distally Psychiatric: Patient is competent for consent with normal mood and affect Lymphatic: No axillary or cervical lymphadenopathy  MUSCULOSKELETAL:  Left lower extremity is splinted with skin that is warm and well-perfused.  No open wounds.  Positive wrinkle sign.  Neurovascularly intact  Assessment: Left closed trimalleolar ankle fracture  Plan: -We again discussed that this is an unstable injury that will need operative stabilization with internal fixation.  We discussed the risk and benefits of this procedure and she has provided informed consent after soliciting all questions to her satisfaction.  - The risks, benefits, and alternatives were discussed with the patient. There are risks associated with the surgery including, but not limited to, problems with anesthesia (death), infection, differences in leg length/angulation/rotation, fracture of bones, loosening or failure of implants, malunion, nonunion, hematoma (blood accumulation) which may require surgical drainage, blood clots, pulmonary embolism, nerve injury (foot drop), and blood vessel injury. The patient understands these risks and elects to  proceed.  -Due to her type 2 diabetes status we will keep her nonweightbearing for a full 8 weeks.  She will be admitted postoperatively under observation for elevation, pain control, and physical therapy.   Nicholes Stairs, MD [facility] [phone]    12/22/2018 1:43 PM

## 2018-12-22 NOTE — Anesthesia Preprocedure Evaluation (Addendum)
Anesthesia Evaluation  Patient identified by MRN, date of birth, ID band Patient awake    Reviewed: Allergy & Precautions, NPO status , Patient's Chart, lab work & pertinent test results  Airway Mallampati: II  TM Distance: >3 FB Neck ROM: Full    Dental no notable dental hx.    Pulmonary neg pulmonary ROS, Current Smoker,    Pulmonary exam normal breath sounds clear to auscultation       Cardiovascular hypertension, Pt. on medications negative cardio ROS Normal cardiovascular exam Rhythm:Regular Rate:Normal     Neuro/Psych PSYCHIATRIC DISORDERS Anxiety Depression Bipolar Disorder Schizophrenia negative neurological ROS     GI/Hepatic negative GI ROS, Neg liver ROS,   Endo/Other  diabetesHypothyroidism Diet controlled T2DM  Obesity BMI 30  Renal/GU CRFRenal diseaseHx nephrolithiasis  Last Cr 1.5  negative genitourinary   Musculoskeletal negative musculoskeletal ROS (+)   Abdominal   Peds negative pediatric ROS (+)  Hematology negative hematology ROS (+)   Anesthesia Other Findings Left ankle trimalleolar fracture  HLD  Reproductive/Obstetrics negative OB ROS                            Anesthesia Physical Anesthesia Plan  ASA: II  Anesthesia Plan: MAC and Regional   Post-op Pain Management:  Regional for Post-op pain   Induction: Intravenous  PONV Risk Score and Plan: 1 and Propofol infusion, TIVA, Midazolam and Treatment may vary due to age or medical condition  Airway Management Planned: Natural Airway and Simple Face Mask  Additional Equipment: None  Intra-op Plan:   Post-operative Plan: Extubation in OR  Informed Consent: I have reviewed the patients History and Physical, chart, labs and discussed the procedure including the risks, benefits and alternatives for the proposed anesthesia with the patient or authorized representative who has indicated his/her  understanding and acceptance.     Dental advisory given  Plan Discussed with: CRNA  Anesthesia Plan Comments:         Anesthesia Quick Evaluation

## 2018-12-23 DIAGNOSIS — S82852A Displaced trimalleolar fracture of left lower leg, initial encounter for closed fracture: Secondary | ICD-10-CM | POA: Diagnosis not present

## 2018-12-23 NOTE — Discharge Instructions (Signed)
Orthopedic discharge instructions:  -Elevate left lower extremity with your "toes above nose."  -Keep your splint clean and dry at all times.  -Do not bear weight to your left lower extremity.  -For mild to moderate pain use Tylenol and/or Advil around-the-clock.  For breakthrough pain use oxycodone as needed.  -For the prevention of blood clots take a 325 mg aspirin once per day for 6 weeks.   - Please NOTE:  Your medications have been sent in from our clinic EMR.  They will be at your pharmacy  -Return to see Dr. Stann Mainland in 2 weeks for routine postop care.

## 2018-12-23 NOTE — Progress Notes (Addendum)
Pt DME delivered, pt refused shower chair/3-n-1 stating "I have one at home and if I need it home health can bring it. Theres no sense in waiting for it." Pt belongings gathered and IV removed. AVS reviewed and all questions answered to pt satisfaction. Pt dressed and equipment loaded into family car. Charge nurse and AD both aware that pt refused to wait for shower chair before discharge.  Sheetal Lyall Elon Spanner, RN 12/23/2018 6:21 PM

## 2018-12-23 NOTE — Plan of Care (Signed)

## 2018-12-23 NOTE — Care Management Obs Status (Signed)
[place] NOTIFICATION   Patient Details  Name: Toni Moses MRN: [id number] Date of Birth: Mar 23, 1960   Medicare Observation Status Notification Given:  Yes    Atilano Median, [place] 12/23/2018, 4:22 PM

## 2018-12-23 NOTE — TOC Initial Note (Signed)
Transition of Care [facility]) - Initial/Assessment Note    Patient Details  Name: Toni Moses MRN: [id number] Date of Birth: [birth year]  Transition of Care [facility]) CM/SW Contact:    Atilano Median, LCSW Phone Number: 12/23/2018, 4:24 PM  Clinical Narrative:                  PT recommending HHPT. Patient is a workers comp case which requires her workers comp Counsellor up her home health. Patient states that she has her information but it is at home. Patient agrees to provide CSW with this information once she has been discharged. CSW will continue to follow after she is discharged to assist with setting up home health.   Patient's cell number is [id number]. [id number] email is [email or web address]        Patient Goals and CMS Choice Patient states their goals for this hospitalization and ongoing recovery are:: go home CMS Medicare.gov Compare Post Acute Care list provided to:: Patient Choice offered to / list presented to : Patient  Expected Discharge Plan and Services           Expected Discharge Date: 12/23/18               DME Arranged: Gilford Rile rolling, 3-N-1 DME Agency: [employer] Date DME Agency Contacted: 12/23/18 Time DME Agency [id number] Representative spoke with at DME Agency: [place]            Prior Living Arrangements/Services                       Activities of [place] Devices/Equipment: [place] (specify quad or straight), Walker (specify type), Wheelchair ADL Screening (condition at time of admission) Patient's cognitive ability adequate to safely complete daily activities?: Yes Is the patient deaf or have difficulty hearing?: No Does the patient have difficulty seeing, even when wearing glasses/contacts?: Yes Does the patient have difficulty concentrating, remembering, or making decisions?: No Patient able to express need for assistance with ADLs?: Yes Does the patient have difficulty dressing or bathing?: No Independently  performs ADLs?: Yes (appropriate for developmental age) Does the patient have difficulty walking or climbing stairs?: Yes Weakness of Legs: None Weakness of Arms/Hands: None  Permission Sought/Granted                  Emotional Assessment              Admission diagnosis:  Left ankle trimalleolar fracture Patient Active Problem List   Diagnosis Date Noted  . Closed displaced trimalleolar fracture of left ankle 12/22/2018  . Cholecystitis 07/09/2015  . Essential hypertension 05/03/2015  . 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 Caregiver stress 11/30/2014  . Neuropathic pain 07/13/2013  . Chronic pain 07/13/2013  . Tobacco use 07/13/2013  . Overweight 07/13/2013  . Tingling in extremities 04/19/2013  . Right shoulder pain 04/12/2013  . Localized swelling, mass and lump, neck 02/01/2013  . Hypercholesteremia   . Depression   . Anxiety   . Hypothyroidism   . Controlled type 2 diabetes mellitus with diabetic polyneuropathy [facility])    PCP:  Default, Provider, MD Pharmacy:   [facility] [place], [place] - [place] [address] [address] Phone: [phone] Fax: [phone]     Social Determinants of Health ([place]) Interventions    Readmission Risk Interventions No flowsheet data found.

## 2018-12-23 NOTE — Evaluation (Signed)
Physical Therapy Evaluation Patient Details Name: Toni Moses MRN: [id number] DOB: [birth year] Today's Date: 12/23/2018   History of Present Illness  58 yo female admitted after ORIF of unstable left trimalleolar fx on 12/22/18, pt sustained fx after fall during normal work [employer]	[place] [employer]. PMH includes anxiety, depression, DM, HTN, unspecified schizophrenia  Clinical Impression  Pt in bed upon arrival and agreeable to PT. Pt educated on [place] on LLE with pt understanding and demonstrating compliance t/o session. Pt reports her bedroom is on the second floor with no option for staying on the main level and assistance only PRN. Pt reports she has been going up the stairs on her bottom and her room is close once she is up the stairs and she has crutches in her room. Pt educated on continuing to go up full flight on her bottom as that is the safest at this time especially with only one handrail and not having 24/7 assist. Pt encouraged to try and limit how often she is going up/down the flight of stairs during the day. Pt performed functional mobility with min guard assist with good compliance with NWB. Pt educated and demonstrating correct performance of LE therex and encouraged to perform t/o her day to maintain strength in left leg. Pt would benefit from a RW for improved stability and decreased fall risk with hop to pattern to maintain NWB and a shower chair. Pt already has a knee scooter for longer distances. Pt presents with decreased strength, ROM, balance and activity tolerance. Pt would benefit from skilled acute PT to improve deficits and recommendation for home health PT to further improve deficits and decrease fall risk.     Follow Up Recommendations Home health PT    Equipment Recommendations  Other (comment);Rolling walker with 5" wheels(shower chair)    Recommendations for Other Services       Precautions / Restrictions Precautions Precautions: Fall Required Braces or Orthoses:  Splint/Cast Splint/Cast: LLE Restrictions Weight Bearing Restrictions: Yes LLE Weight Bearing: Non weight bearing      Mobility  Bed Mobility Overal bed mobility: Modified Independent             General bed mobility comments: mod I getting EOB, increased time and effort  Transfers Overall transfer level: Needs assistance Equipment used: Rolling walker (2 wheeled) Transfers: Sit to/from [facility] Sit to Stand: Min guard Stand pivot transfers: Min guard       General transfer comment: min guard for safety for STS from bed and stand pivot to recliner, cuing required for hand placement, no physical assist to rise  Ambulation/Gait Ambulation/Gait assistance: Min guard Gait Distance (Feet): 5 Feet Assistive device: Rolling walker (2 wheeled)   Gait velocity: decreased   General Gait Details: pt ambulated in room to recliner with hop to pattern and RW, pt steady and no physical assist required, min cuing for Rw mgt  Stairs            Wheelchair Mobility    Modified Rankin (Stroke Patients Only)       Balance Overall balance assessment: Mild deficits observed, not formally tested(balance deficits consistent with NWBing status on LLE, reliant on UE support during standing and ambulation,steady sitting EOB with feet supported and no UE support)  Pertinent Vitals/Pain Pain Assessment: Faces Faces Pain Scale: Hurts little more Pain Location: LLE Pain Descriptors / Indicators: Sore;Grimacing;Discomfort Pain Intervention(s): Monitored during session;Repositioned;Limited activity within patient's tolerance    Home Living Family/patient expects to be discharged to:: Private residence Living Arrangements: Non-relatives/Friends Available Help at Discharge: Friend(s);Available PRN/intermittently Type of Home: Apartment Home Access: Stairs to enter Entrance Stairs-Rails: Left Entrance  Stairs-Number of Steps: 3-4 Home Layout: Bed/bath upstairs Home Equipment: Bedside commode;Wheelchair - Biochemist, clinical)      Prior Function Level of Independence: Independent         Comments: still working, 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, likes to dance and go out with friends, reports one other fall on the job without injury     Hand Dominance        Extremity/Trunk Assessment   Upper Extremity Assessment Upper Extremity Assessment: Overall WFL for tasks assessed    Lower Extremity Assessment Lower Extremity Assessment: LLE deficits/detail;Overall WFL for tasks assessed LLE Deficits / Details: left hip flexors 3+/5 LLE: Unable to fully assess due to pain    Cervical / Trunk Assessment Cervical / Trunk Assessment: Normal  Communication   Communication: No difficulties  Cognition Arousal/Alertness: Awake/alert Behavior During Therapy: WFL for tasks assessed/performed Overall Cognitive Status: Within Functional Limits for tasks assessed                                        General Comments      Exercises Total Joint Exercises Long Arc Quad: AROM;Left;10 reps Marching in Standing: AROM;Both;10 reps;Seated Other Exercises Other Exercises: pt educated and provided demonstration for SLR and hip ABD while in bed   Assessment/Plan    PT Assessment Patient needs continued PT services  PT Problem List Decreased strength;Decreased mobility;Decreased range of motion;Decreased activity tolerance;Decreased balance;Decreased knowledge of use of DME;Pain       PT Treatment Interventions DME instruction;Therapeutic exercise;Gait training;Balance training;Stair training;Functional mobility training;Therapeutic activities;Patient/family education    PT Goals (Current goals can be found in the Care Plan section)  Acute Rehab PT Goals Patient Stated Goal: go home before the 6th PT Goal Formulation: With patient Time For Goal  Achievement: 01/06/19 Potential to Achieve Goals: Fair    Frequency Min 5X/week   Barriers to discharge Inaccessible home environment;Decreased caregiver support pts bedroom on second level with no option for staying on main level, assistance from caregiver only PRN    Co-evaluation               AM-PAC PT "6 Clicks" Mobility  Outcome Measure Help needed turning from your back to your side while in a flat bed without using bedrails?: None Help needed moving from lying on your back to sitting on the side of a flat bed without using bedrails?: None Help needed moving to and from a bed to a chair (including a wheelchair)?: A Little Help needed standing up from a chair using your arms (e.g., wheelchair or bedside chair)?: A Little Help needed to walk in hospital room?: A Little Help needed climbing 3-5 steps with a railing? : A Lot 6 Click Score: 19    End of Session Equipment Utilized During Treatment: Gait belt Activity Tolerance: Patient tolerated treatment well Patient left: in chair;with call bell/phone within reach Nurse Communication: Mobility status PT Visit Diagnosis: Difficulty in walking, not elsewhere classified (R26.2);Other abnormalities of gait and mobility (R26.89)  Time: [id number] PT Time Calculation (min) (ACUTE ONLY): 28 min   Charges:   PT Evaluation $PT Eval Moderate Complexity: 1 Mod PT Treatments $Therapeutic Exercise: 8-22 mins       Zachary George PT, DPT 9:57 AM,12/23/18  Nalaysia Manganiello Drucilla Chalet 12/23/2018, 9:51 AM

## 2018-12-23 NOTE — Plan of Care (Signed)

## 2018-12-24 ENCOUNTER — Encounter (HOSPITAL_COMMUNITY): Payer: Self-pay | Admitting: Orthopedic Surgery

## 2018-12-24 NOTE — TOC Transition Note (Signed)
Transition of Care [facility]) - CM/SW Discharge Note   Patient Details  Name: Toni Moses MRN: [id number] Date of Birth: 08/30/1960  Transition of Care [facility] CM/SW Contact:  Atilano Median, LCSW Phone Number: 12/24/2018, 1:45 PM   Clinical Narrative:    PT recommending HHPT. Patient is a workers comp case which requires her workers comp Counsellor up her home health. Patient states that she has her information but it is at home. Patient agrees to provide CSW with this information once she has been discharged. CSW will continue to follow after she is discharged to assist with setting up home health.   Patient's cell number is [phone]. [phone]   Final next level of care: Home/Self Care     Patient Goals and CMS Choice Patient states their goals for this hospitalization and ongoing recovery are:: go home CMS Medicare.gov Compare Post Acute Care list provided to:: Patient Choice offered to / list presented to : Patient  Discharge Placement                       Discharge Plan and Services                DME Arranged: Walker rolling, 3-N-1 DME Agency: [employer] Date DME Agency Contacted: 12/23/18 Time DME Agency Contacted: [id number] Representative spoke with at DME Agency: [place] Interventions     Readmission Risk Interventions No flowsheet data found.

## 2018-12-28 NOTE — Discharge Summary (Signed)
Patient ID: Toni Moses MRN: [id number] DOB/AGE: 58-Sep-1962 57 y.o.  Admit date: 12/22/2018 Discharge date: 12/23/2018  Primary Diagnosis: Left ankle trimalleolar fracture Admission Diagnoses:  Past Medical History:  Diagnosis Date  . Anxiety   . Benign neoplasm of lymph nodes   . Bipolar I disorder, most recent episode (or current) unspecified   . Depression   . Diabetes mellitus type 2, controlled ([place])    controlled with diet  . Enlargement of lymph nodes   . History of kidney stones   . Hypercholesteremia   . Hypertension   . Hypothyroidism   . Lumbago   . Myalgia and myositis, unspecified   . Other abnormal blood chemistry   . Other atopic dermatitis and related conditions   . Thyroid disease   . Unspecified schizophrenia, unspecified condition    Discharge Diagnoses:   Active Problems:   Closed displaced trimalleolar fracture of left ankle  Estimated body mass index is 30.04 kg/m as calculated from the following:   Height as of this encounter: 5\' 4"  (1.626 m).   Weight as of this encounter: 79.4 kg.  Procedure:  Procedure(s) (LRB): OPEN REDUCTION INTERNAL FIXATION (ORIF) ANKLE FRACTURE (Left)   Consults: None  HPI: Toni Moses sustained a injury to her left ankle at work.  She presents to the inpatient hospital setting for operative fixation as well as postoperative therapy. Laboratory Data: Admission on 12/22/2018, Discharged on 12/23/2018  Component Date Value Ref Range Status  . Glucose-Capillary 12/22/2018 100* 70 - 99 mg/dL Final  . Comment 1 12/22/2018 Notify RN   Final  . Comment 2 12/22/2018 Document in Chart   Final  . Glucose-Capillary 12/22/2018 108* 70 - 99 mg/dL Final  . HIV Screen 4th Generation wRfx 12/22/2018 NON REACTIVE  NON REACTIVE Final   Performed at [facility], [place] [address], [place], [place] [address]  . WBC 12/22/2018 6.2  4.0 - 10.5 K/uL Final  . RBC 12/22/2018 3.77* 3.87 - 5.11 MIL/uL Final  . Hemoglobin 12/22/2018  10.8* 12.0 - 15.0 g/dL Final  . HCT 12/22/2018 34.5* 36.0 - 46.0 % Final  . MCV 12/22/2018 91.5  80.0 - 100.0 fL Final  . MCH 12/22/2018 28.6  26.0 - 34.0 pg Final  . MCHC 12/22/2018 31.3  30.0 - 36.0 g/dL Final  . RDW 12/22/2018 15.7* 11.5 - 15.5 % Final  . Platelets 12/22/2018 361  150 - 400 K/uL Final  . nRBC 12/22/2018 0.0  0.0 - 0.2 % Final   Performed at [facility], [place] [address], [place], [place] [address]  . Creatinine, Ser 12/22/2018 1.39* 0.44 - 1.00 mg/dL Final  . GFR calc non Af Amer 12/22/2018 42* >60 mL/min Final  . GFR calc Af Amer 12/22/2018 48* >60 mL/min Final   Performed at [facility], [place] [address], [place], [place] [address]  Hospital Outpatient Visit on 12/20/2018  Component Date Value Ref Range Status  . SARS Coronavirus 2 12/20/2018 NEGATIVE  NEGATIVE Final   Comment: (NOTE) SARS-CoV-2 target nucleic acids are NOT DETECTED. The SARS-CoV-2 RNA is generally detectable in upper and lower respiratory specimens during the acute phase of infection. Negative results do not preclude SARS-CoV-2 infection, do not rule out co-infections with other pathogens, and should not be used as the sole basis for treatment or other patient management decisions. Negative results must be combined with clinical observations, patient history, and epidemiological information. The expected result is Negative. Fact Sheet for Patients: SugarRoll.be Fact  Sheet for Healthcare Providers: https://www.woods-mathews.com/ This test is not yet approved or cleared by the [place] FDA and  has been authorized for detection and/or diagnosis of SARS-CoV-2 by FDA under an Emergency Use Authorization (EUA). This EUA will remain  in effect (meaning this test can be used) for the duration of the COVID-19 declaration under Section 56                          4(b)(1) of the Act, 21 U.S.C. section 360bbb-3(b)(1), unless the authorization is  terminated or revoked sooner. Performed at [facility], [place] [address] [place], [place] [address]   Hospital Outpatient Visit on 12/20/2018  Component Date Value Ref Range Status  . WBC 12/20/2018 4.7  4.0 - 10.5 K/uL Final  . RBC 12/20/2018 3.77* 3.87 - 5.11 MIL/uL Final  . Hemoglobin 12/20/2018 11.1* 12.0 - 15.0 g/dL Final  . HCT 12/20/2018 33.8* 36.0 - 46.0 % Final  . MCV 12/20/2018 89.7  80.0 - 100.0 fL Final  . MCH 12/20/2018 29.4  26.0 - 34.0 pg Final  . MCHC 12/20/2018 32.8  30.0 - 36.0 g/dL Final  . RDW 12/20/2018 15.7* 11.5 - 15.5 % Final  . Platelets 12/20/2018 380  150 - 400 K/uL Final  . nRBC 12/20/2018 0.0  0.0 - 0.2 % Final   Performed at [facility], [place] [address] [place], [place] [address]  . Sodium 12/20/2018 139  135 - 145 mmol/L Final  . Potassium 12/20/2018 4.2  3.5 - 5.1 mmol/L Final  . Chloride 12/20/2018 109  98 - 111 mmol/L Final  . CO2 12/20/2018 22  22 - 32 mmol/L Final  . Glucose, Bld 12/20/2018 89  70 - 99 mg/dL Final  . BUN 12/20/2018 18  6 - 20 mg/dL Final  . Creatinine, Ser 12/20/2018 1.46* 0.44 - 1.00 mg/dL Final  . Calcium 12/20/2018 9.8  8.9 - 10.3 mg/dL Final  . GFR calc non Af Amer 12/20/2018 39* >60 mL/min Final  . GFR calc Af Amer 12/20/2018 46* >60 mL/min Final  . Anion gap 12/20/2018 8  5 - 15 Final   Performed at [facility], [place] [address] [place], [place] [address]  . Hgb A1c MFr Bld 12/20/2018 6.1* 4.8 - 5.6 % Final   Comment: (NOTE) Pre diabetes:          5.7%-6.4% Diabetes:              >6.4% Glycemic control for   <7.0% adults with diabetes   . Mean Plasma Glucose 12/20/2018 128.37  mg/dL Final   Performed at [place] [address], [place], [place] [address]  . MRSA, PCR 12/20/2018 NEGATIVE  NEGATIVE Final  . Staphylococcus aureus 12/20/2018 POSITIVE* NEGATIVE Final   Comment: (NOTE) The Xpert SA Assay (FDA approved for NASAL specimens in patients 75 years of age and older), is one  component of a comprehensive surveillance program. It is not intended to diagnose infection nor to guide or monitor treatment. Performed at [facility], [place] [address] [place], [place] [address]   . Glucose-Capillary 12/20/2018 83  70 - 99 mg/dL Final     X-Rays:Dg Ankle 2 Views Left  Result Date: 12/06/2018 CLINICAL DATA:  Left ankle pain secondary to a fall this morning. EXAM: LEFT ANKLE - 2 VIEW COMPARISON:  None. FINDINGS: There is a trimalleolar fracture. Slight impaction minimal distraction of the posterior malleolar  fragment. Slight distraction of the medial malleolar fragment. Minimal distraction of fracture of the distal fibula. Lateral soft tissue swelling. Ankle effusion. Abnormal widening of the anterior and medial aspects of the ankle joint. IMPRESSION: Trimalleolar fracture of the left ankle as described. Electronically Signed   By: Lorriane Shire M.D.   On: 12/06/2018 13:07   Dg Ankle Complete Left  Result Date: 12/22/2018 CLINICAL DATA:  Left ankle ORIF EXAM: LEFT ANKLE COMPLETE - 3+ VIEW; DG C-ARM 1-60 MIN COMPARISON:  None. FINDINGS: Medial malleolar fracture transfixed with a cannulated screw. Two screws transfixing the posterior malleolar fracture. Oblique fracture of the distal fibula transfixed with a lateral sideplate and interlocking screws. No other fracture or dislocation. Ankle mortise is intact. Soft tissue swelling around the ankle. FLUOROSCOPY TIME:  43 seconds IMPRESSION: Intraoperative localization. Electronically Signed   By: Kathreen Devoid   On: 12/22/2018 16:48   Dg C-arm 1-60 Min  Result Date: 12/22/2018 CLINICAL DATA:  Left ankle ORIF EXAM: LEFT ANKLE COMPLETE - 3+ VIEW; DG C-ARM 1-60 MIN COMPARISON:  None. FINDINGS: Medial malleolar fracture transfixed with a cannulated screw. Two screws transfixing the posterior malleolar fracture. Oblique fracture of the distal fibula transfixed with a lateral sideplate and interlocking screws. No other fracture or  dislocation. Ankle mortise is intact. Soft tissue swelling around the ankle. FLUOROSCOPY TIME:  43 seconds IMPRESSION: Intraoperative localization. Electronically Signed   By: Kathreen Devoid   On: 12/22/2018 16:48    EKG: Orders placed or performed during the hospital encounter of 12/20/18  . EKG 12 lead  . EKG 12 lead     Hospital Course: Toni Moses is a 58 y.o. who was admitted to Hospital. They were brought to the operating room on 12/22/2018 and underwent Procedure(s): OPEN REDUCTION INTERNAL FIXATION (ORIF) ANKLE FRACTURE.  Patient tolerated the procedure well and was later transferred to the recovery room and then to the orthopaedic floor for postoperative care.  They were given PO and IV analgesics for pain control following their surgery.  They were given 24 hours of postoperative antibiotics of  Anti-infectives (From admission, onward)   Start     Dose/Rate Route Frequency Ordered Stop   12/22/18 1200  ceFAZolin (ANCEF) IVPB 2g/100 mL premix     2 g 200 mL/hr over 30 Minutes Intravenous On call to O.R. 12/22/18 1152 12/22/18 1412     and started on DVT prophylaxis in the form of Aspirin.   PT were ordered.  Discharge planning consulted to help with postop disposition and equipment needs.  Patient had a good night on the evening of surgery. By day one, the patient had progressed with therapy and meeting their goals.   Patient was seen in rounds and was ready to go home.   Diet: Regular diet Activity:NWB Follow-up:in 2 weeks Disposition - Home Discharged Condition: good   Discharge Instructions    Call MD / Call 911   Complete by: As directed    If you experience chest pain or shortness of breath, CALL 911 and be transported to the hospital emergency room.  If you develope a fever above 101 F, pus (white drainage) or increased drainage or redness at the wound, or calf pain, call your surgeon's office.   Constipation Prevention   Complete by: As directed    Drink plenty of  fluids.  Prune juice may be helpful.  You may use a stool softener, such as Colace (over the counter) 100 mg twice a day.  Use  MiraLax (over the counter) for constipation as needed.   Diet - low sodium heart healthy   Complete by: As directed    Face-to-face encounter (required for Medicare/Medicaid patients)   Complete by: As directed    I Nicholes Stairs certify that this patient is under my care and that I, or a nurse practitioner or physician's assistant working with me, had a face-to-face encounter that meets the physician face-to-face encounter requirements with this patient on 12/23/2018. The encounter with the patient was in whole, or in part for the following medical condition(s) which is the primary reason for home health care (List medical condition): S/P left ankle ORIF.  blindness   The encounter with the patient was in whole, or in part, for the following medical condition, which is the primary reason for home health care: s/p ankle surgery, unable to bear weight   I certify that, based on my findings, the following services are medically necessary home health services: Physical therapy   Reason for Medically Necessary Home Health Services: Therapy- Instruction on Safe use of Assistive Devices for ADLs   My clinical findings support the need for the above services: Unable to leave home safely without assistance and/or assistive device   Further, I certify that my clinical findings support that this patient is homebound due to: Unable to leave home safely without assistance   Home Health   Complete by: As directed    To provide the following care/treatments: PT   Increase activity slowly as tolerated   Complete by: As directed    Walker rolling   Complete by: As directed      Allergies as of 12/23/2018      Reactions   Citalopram Other (See Comments)   "made her feel bad", "foaming at the mouth"      Medication List    TAKE these medications   ALPRAZolam 1 MG tablet  Commonly known as: XANAX take 1 tablet by mouth three times a day if needed What changed:   how much to take  how to take this  when to take this  reasons to take this  additional instructions   amLODipine 2.5 MG tablet Commonly known as: NORVASC Take 2.5 mg by mouth daily.   clobetasol cream 0.05 % Commonly known as: TEMOVATE Apply 1 application topically 2 (two) times daily. What changed:   when to take this  reasons to take this   FLUoxetine 20 MG capsule Commonly known as: PROZAC Take 20 mg by mouth at bedtime.   HYDROcodone-acetaminophen 5-325 MG tablet Commonly known as: NORCO/VICODIN Take 1-2 tablets by mouth every 6 (six) hours as needed for moderate pain.   hydrocortisone 2.5 % cream Apply topically 2 (two) times daily. What changed:   how much to take  when to take this  reasons to take this   levothyroxine 175 MCG tablet Commonly known as: SYNTHROID Take 175 mcg by mouth daily before breakfast.   triamcinolone cream 0.5 % Commonly known as: KENALOG Apply 1 application topically 2 (two) times daily as needed (for ezcema).      Follow-up Information    Nicholes Stairs, MD In 2 weeks.   Specialty: Orthopedic Surgery Why: For suture removal, For wound re-check Contact information: [address] [place] [address] [place]  [id number]           Signed: Geralynn Rile, MD Orthopaedic Surgery 12/28/2018, 3:44 PM

## 2019-03-17 ENCOUNTER — Ambulatory Visit: Payer: Self-pay

## 2019-04-02 ENCOUNTER — Ambulatory Visit: Payer: Self-pay | Attending: Internal Medicine

## 2019-04-02 DIAGNOSIS — Z23 Encounter for immunization: Secondary | ICD-10-CM

## 2019-04-02 NOTE — Progress Notes (Signed)
   Covid-19 Vaccination Clinic  Name:  Toni Moses    MRN: [id number] DOB: [birth year]  04/02/2019  [name] was observed post Covid-19 immunization for 15 minutes without incident. She was provided with Vaccine Information Sheet and instruction to access the V-Safe system.   [name] was instructed to call 911 with any severe reactions post vaccine: Marland Kitchen Difficulty breathing  . Swelling of face and throat  . A fast heartbeat  . A bad rash all over body  . Dizziness and weakness   Immunizations Administered    Name Date Dose VIS Date Route   Pfizer COVID-19 Vaccine 04/02/2019  9:38 AM 0.3 mL 12/31/2018 Intramuscular   Manufacturer: [place]   Lot: [id number]   [place]: [id number]

## 2019-04-16 ENCOUNTER — Ambulatory Visit: Payer: Self-pay

## 2019-04-26 ENCOUNTER — Ambulatory Visit: Payer: Self-pay | Attending: Internal Medicine

## 2019-04-26 DIAGNOSIS — Z23 Encounter for immunization: Secondary | ICD-10-CM

## 2019-04-26 NOTE — Progress Notes (Signed)
   Covid-19 Vaccination Clinic  Name:  Toni Moses    MRN: [id number] DOB: 12/21/1960  04/26/2019  Ms. [name] was observed post Covid-19 immunization for 15 minutes without incident. She was provided with Vaccine Information Sheet and instruction to access the V-Safe system.   Ms. [name] was instructed to call 911 with any severe reactions post vaccine: Marland Kitchen Difficulty breathing  . Swelling of face and throat  . A fast heartbeat  . A bad rash all over body  . Dizziness and weakness   Immunizations Administered    Name Date Dose VIS Date Route   Pfizer COVID-19 Vaccine 04/26/2019  9:27 AM 0.3 mL 12/31/2018 Intramuscular   Manufacturer: [employer]   Lot: [id number]   [place]: [id number]

## 2020-05-30 ENCOUNTER — Other Ambulatory Visit: Payer: Self-pay

## 2020-05-30 ENCOUNTER — Other Ambulatory Visit: Payer: Self-pay | Admitting: Occupational Medicine

## 2020-05-30 ENCOUNTER — Ambulatory Visit: Payer: Self-pay

## 2020-05-30 DIAGNOSIS — M79645 Pain in left finger(s): Secondary | ICD-10-CM

## 2020-10-29 IMAGING — CR DG ANKLE 2V *L*
2 series · 2 of 2 positions shown · non-contrast
Comparison: None.

CLINICAL DATA: Left ankle pain secondary to a fall this morning.

EXAM:
LEFT ANKLE - 2 VIEW

[x ankle ap left]
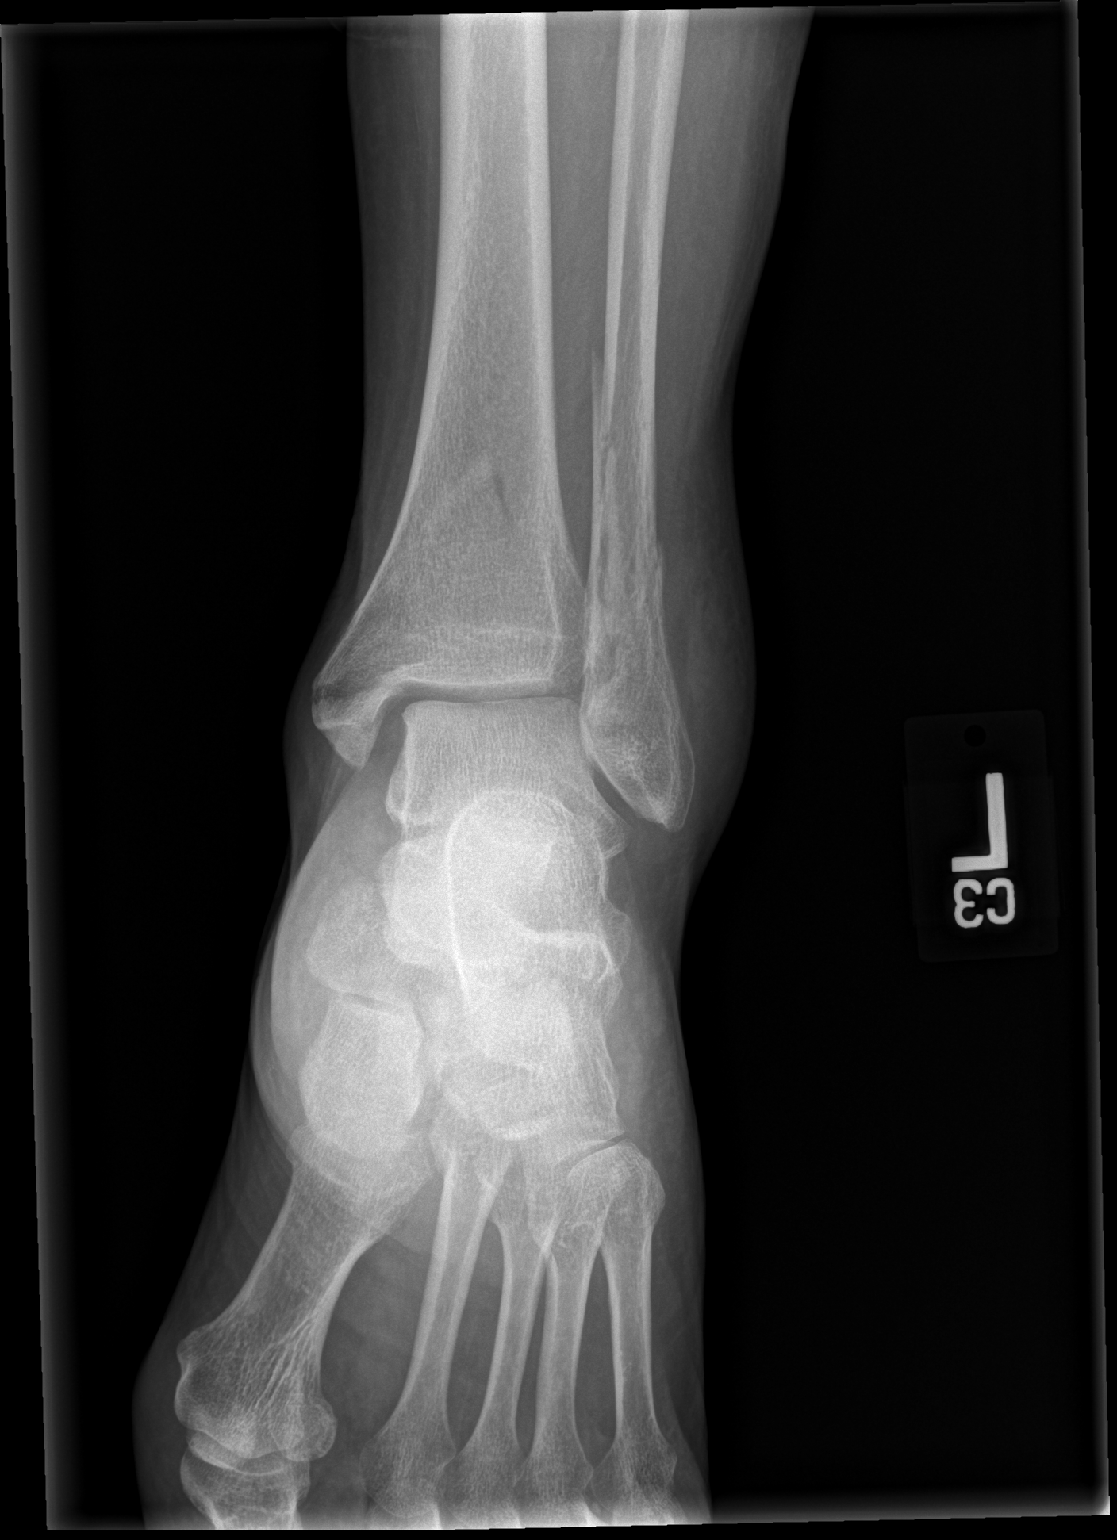

[x ankle lat left]
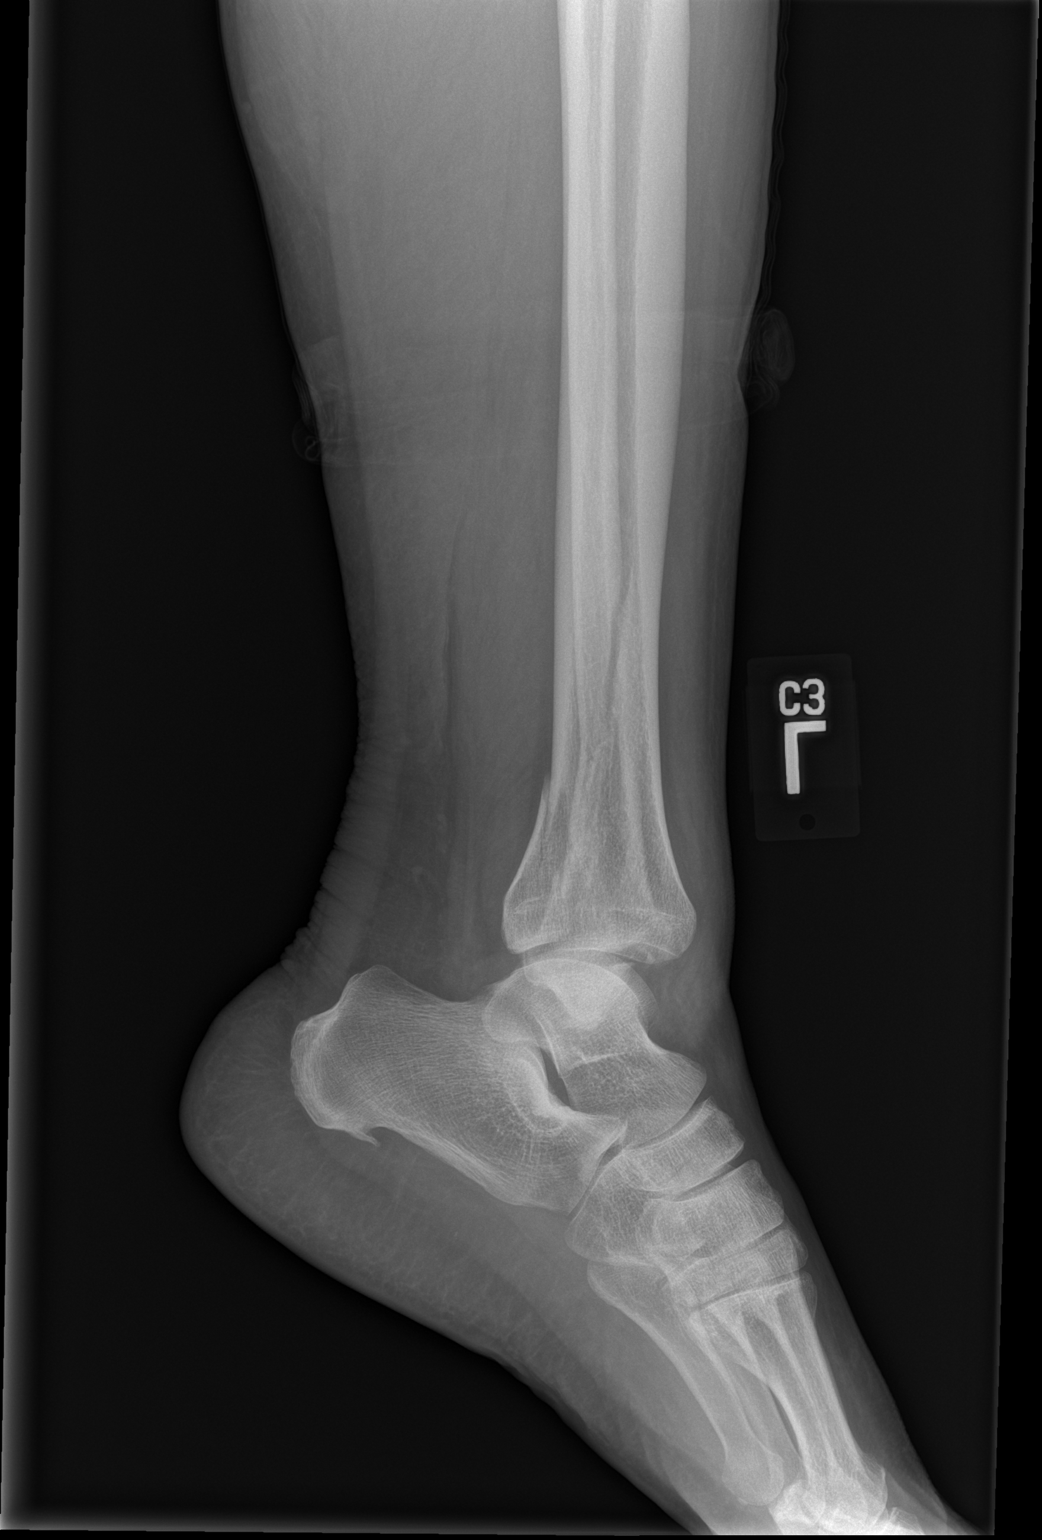

[2 of 2 positions shown; findings below may reference images not displayed]

FINDINGS: There is a trimalleolar fracture. Slight impaction minimal
distraction of the posterior malleolar fragment. Slight distraction
of the medial malleolar fragment. Minimal distraction of fracture of
the distal fibula.

Lateral soft tissue swelling. Ankle effusion. Abnormal widening of
the anterior and medial aspects of the ankle joint.
IMPRESSION: Trimalleolar fracture of the left ankle as described.

## 2020-11-14 IMAGING — RF DG C-ARM 1-60 MIN
1 series · 6 of 6 positions shown · non-contrast
Comparison: None.

CLINICAL DATA: Left ankle ORIF

EXAM:
LEFT ANKLE COMPLETE - 3+ VIEW; DG C-ARM 1-60 MIN

[Series 1: run · 6 of 6 slices shown]
[im 1/6]
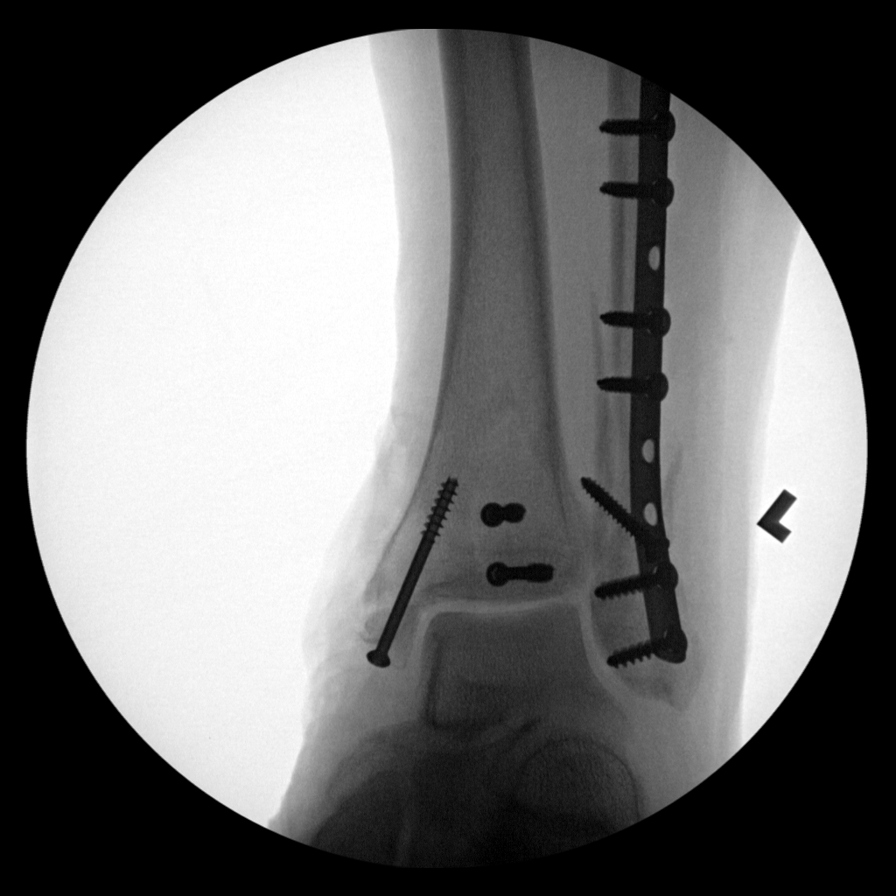
[im 2/6]
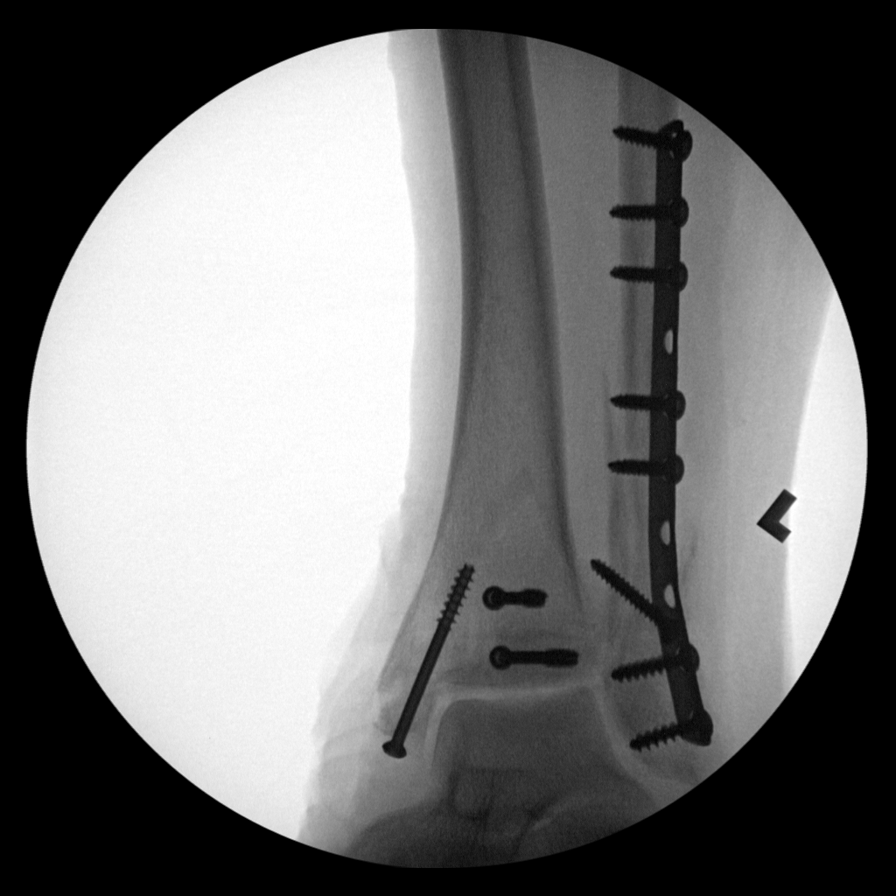
[im 3/6]
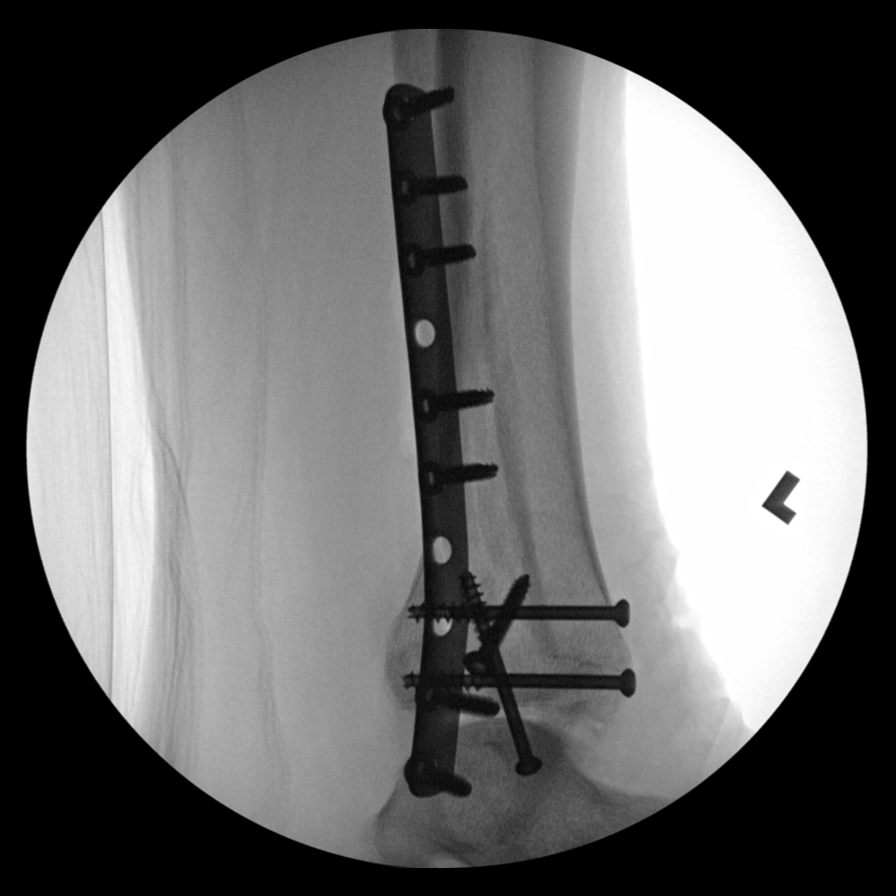
[im 4/6]
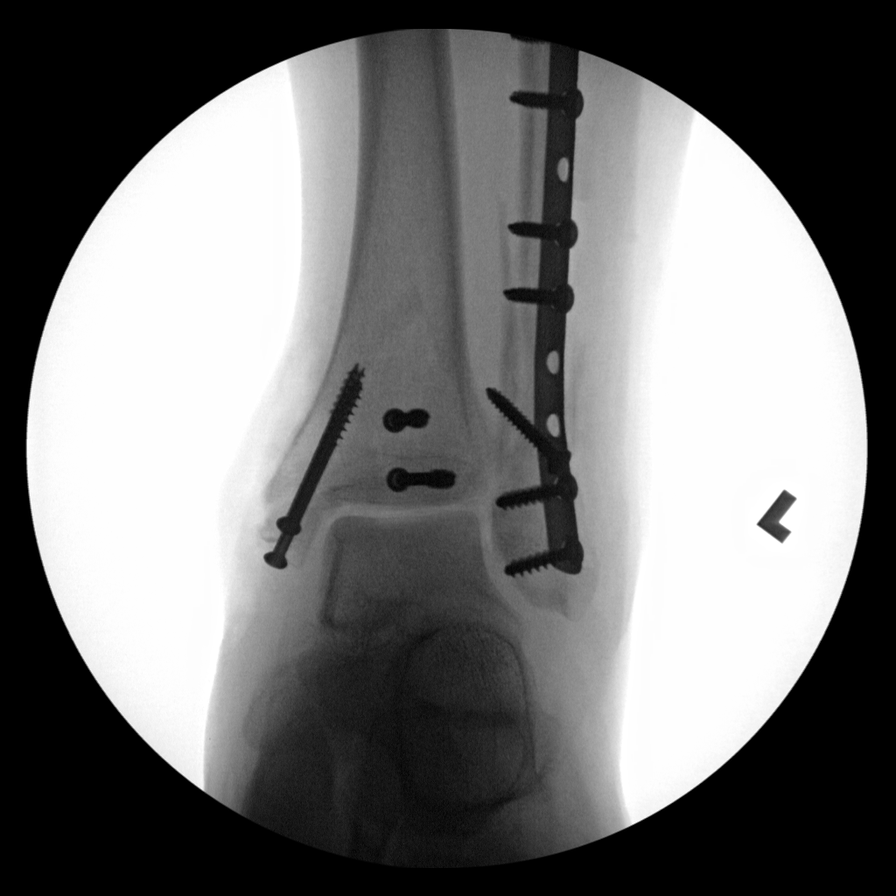
[im 5/6]
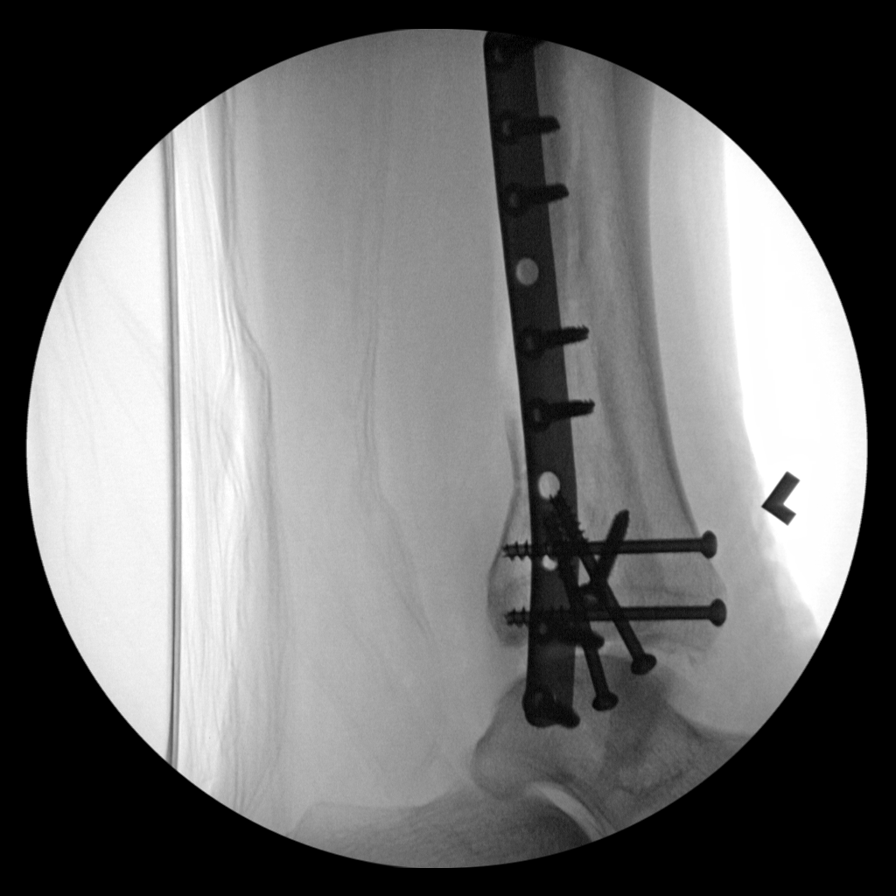
[im 6/6]
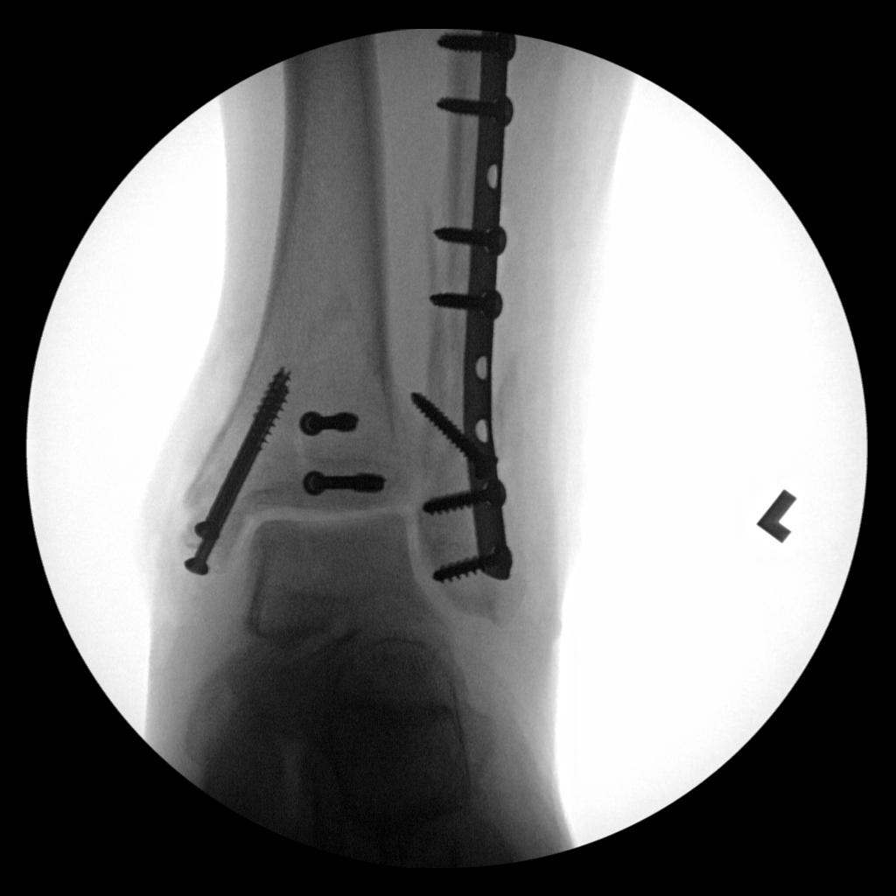

[6 of 6 positions shown; findings below may reference images not displayed]

FINDINGS: Medial malleolar fracture transfixed with a cannulated screw. Two
screws transfixing the posterior malleolar fracture. Oblique
fracture of the distal fibula transfixed with a lateral sideplate
and interlocking screws.

No other fracture or dislocation. Ankle mortise is intact. Soft
tissue swelling around the ankle.

FLUOROSCOPY TIME:  43 seconds
IMPRESSION: Intraoperative localization.

## 2020-11-14 IMAGING — RF DG ANKLE COMPLETE 3+V*L*
1 series · 6 of 6 positions shown · non-contrast
Comparison: None.

CLINICAL DATA: Left ankle ORIF

EXAM:
LEFT ANKLE COMPLETE - 3+ VIEW; DG C-ARM 1-60 MIN

[Series 1: run · 6 of 6 slices shown]
[im 1/6]
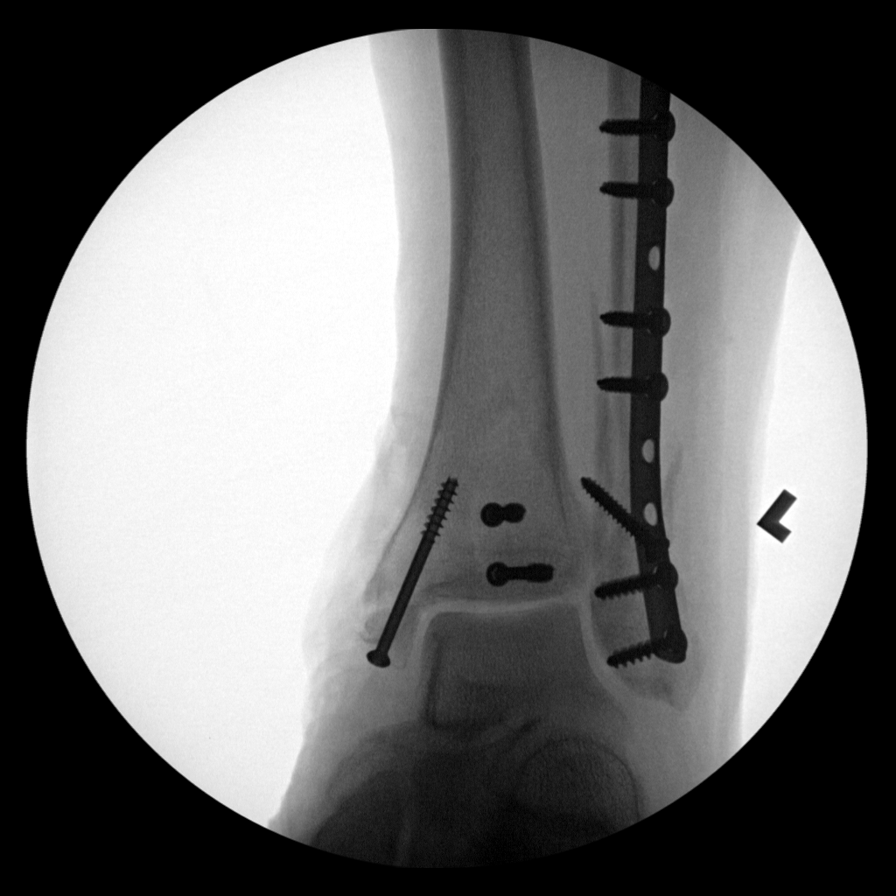
[im 2/6]
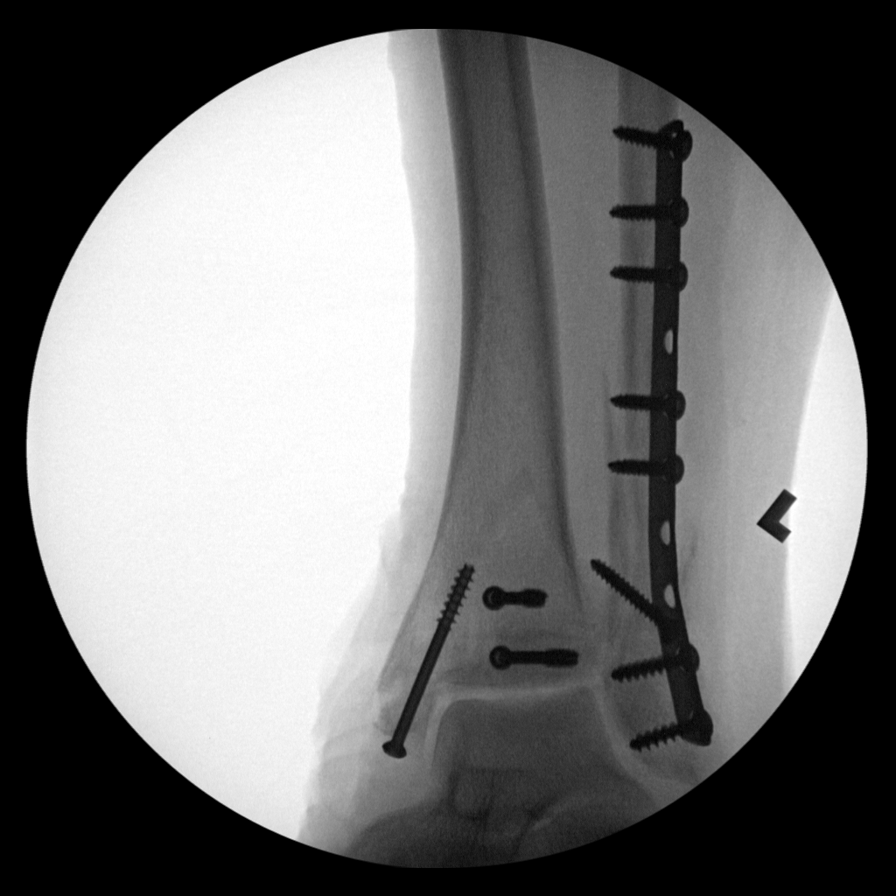
[im 3/6]
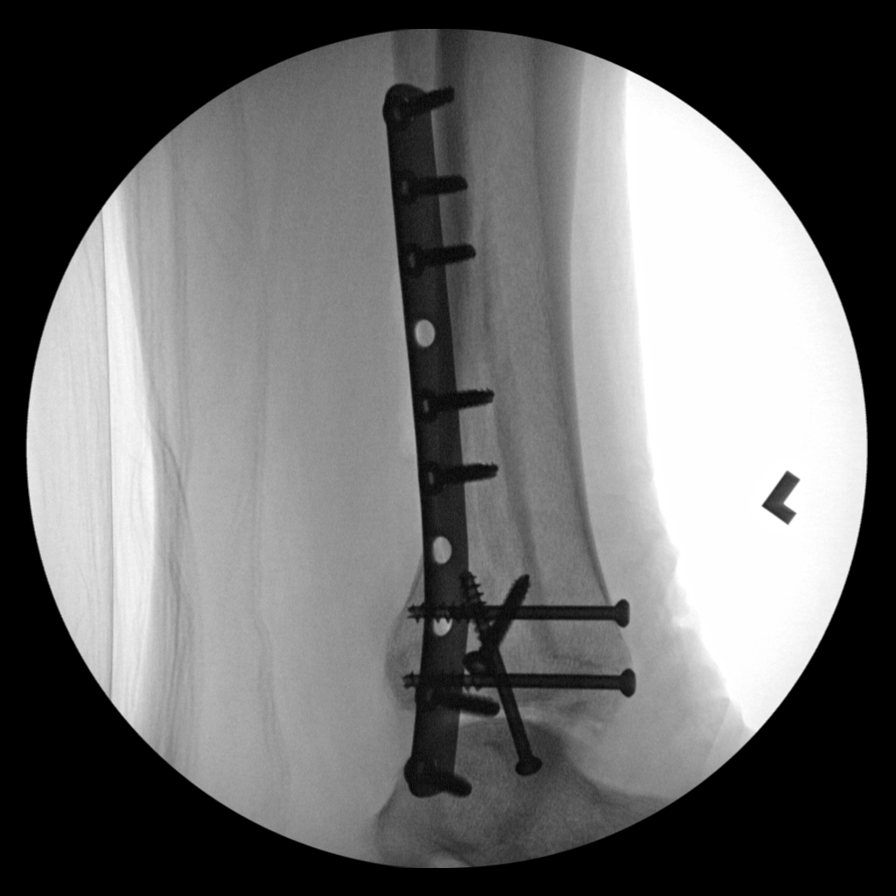
[im 4/6]
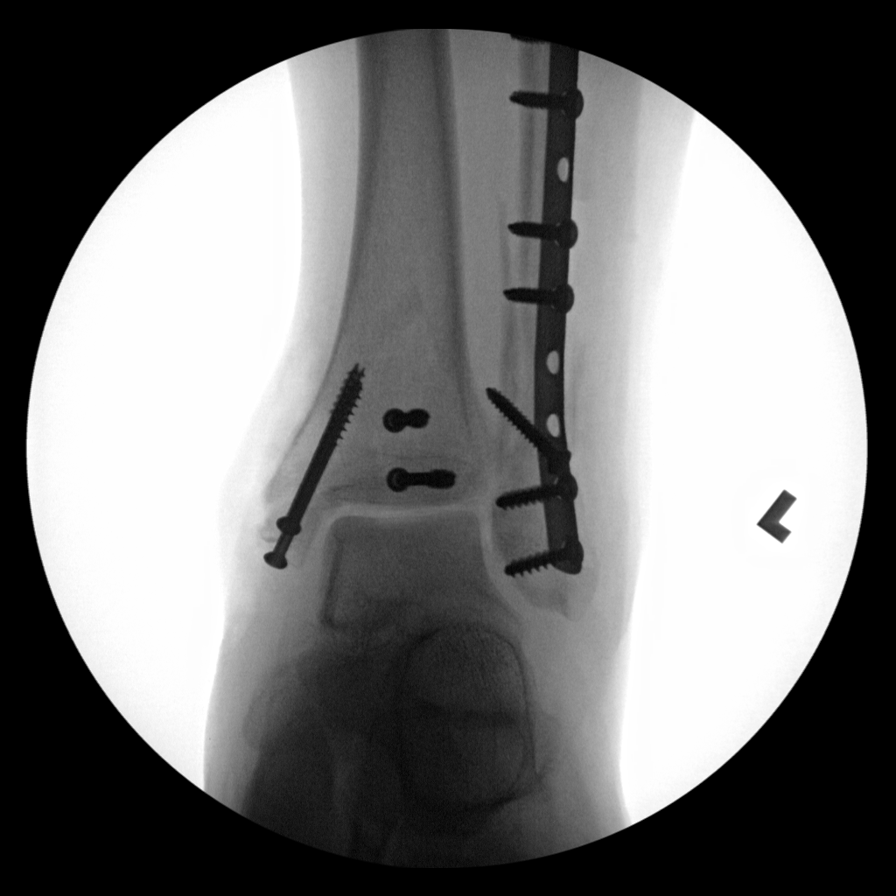
[im 5/6]
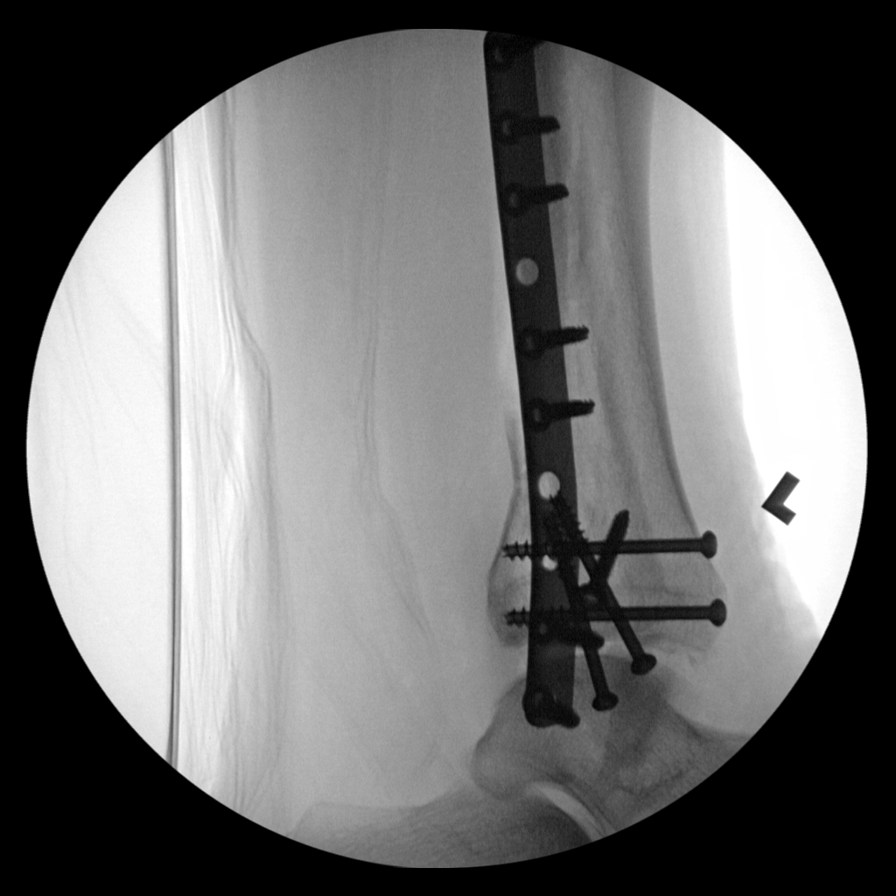
[im 6/6]
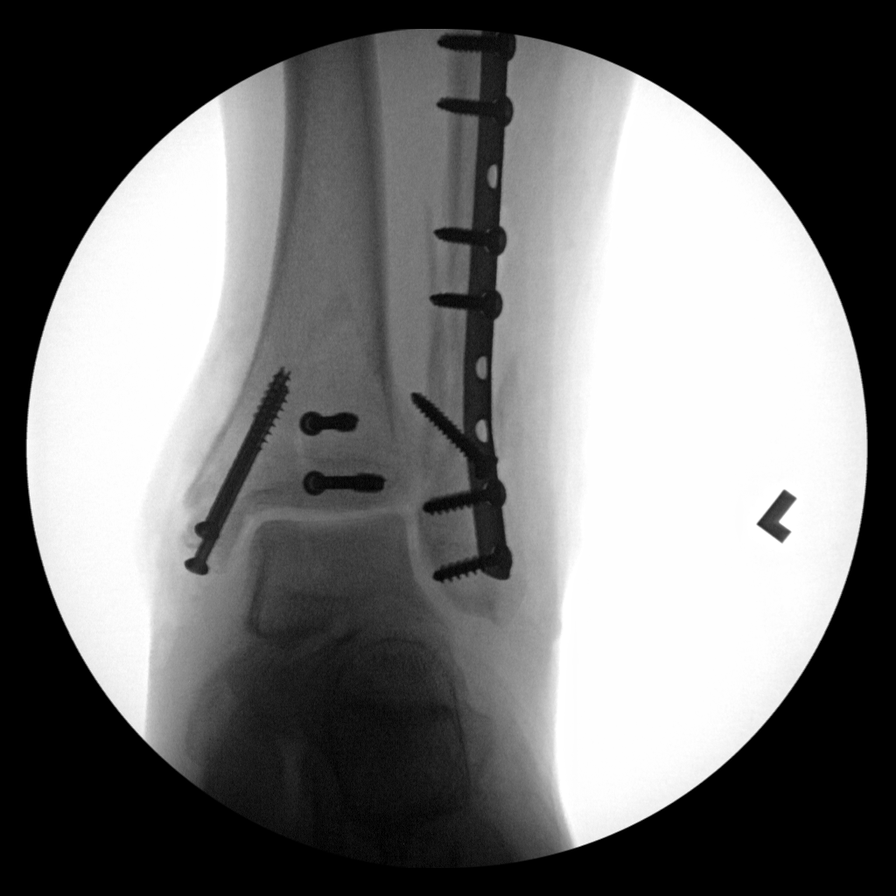

[6 of 6 positions shown; findings below may reference images not displayed]

FINDINGS: Medial malleolar fracture transfixed with a cannulated screw. Two
screws transfixing the posterior malleolar fracture. Oblique
fracture of the distal fibula transfixed with a lateral sideplate
and interlocking screws.

No other fracture or dislocation. Ankle mortise is intact. Soft
tissue swelling around the ankle.

FLUOROSCOPY TIME:  43 seconds
IMPRESSION: Intraoperative localization.

## 2021-05-20 DEATH — deceased
